# Patient Record
Sex: Male | Born: 1965 | Race: Black or African American | Hispanic: No | Marital: Married | Smoking: Current every day smoker
Health system: Southern US, Community
[De-identification: ages and names within clinical notes are randomized; demographics above are authoritative.]

## PROBLEM LIST (undated history)

## (undated) DIAGNOSIS — Z21 Asymptomatic human immunodeficiency virus [HIV] infection status: Secondary | ICD-10-CM

## (undated) DIAGNOSIS — E119 Type 2 diabetes mellitus without complications: Secondary | ICD-10-CM

## (undated) DIAGNOSIS — I10 Essential (primary) hypertension: Secondary | ICD-10-CM

## (undated) DIAGNOSIS — B20 Human immunodeficiency virus [HIV] disease: Secondary | ICD-10-CM

## (undated) HISTORY — DX: Human immunodeficiency virus (HIV) disease: B20

---

## 2013-02-17 DIAGNOSIS — I10 Essential (primary) hypertension: Secondary | ICD-10-CM | POA: Diagnosis present

## 2013-12-25 DIAGNOSIS — E1042 Type 1 diabetes mellitus with diabetic polyneuropathy: Secondary | ICD-10-CM | POA: Diagnosis present

## 2015-06-18 DIAGNOSIS — F1020 Alcohol dependence, uncomplicated: Secondary | ICD-10-CM | POA: Insufficient documentation

## 2017-05-12 ENCOUNTER — Other Ambulatory Visit: Payer: Self-pay

## 2017-05-12 ENCOUNTER — Encounter (HOSPITAL_BASED_OUTPATIENT_CLINIC_OR_DEPARTMENT_OTHER): Payer: Self-pay | Admitting: *Deleted

## 2017-05-12 ENCOUNTER — Emergency Department (HOSPITAL_BASED_OUTPATIENT_CLINIC_OR_DEPARTMENT_OTHER): Payer: BLUE CROSS/BLUE SHIELD

## 2017-05-12 ENCOUNTER — Emergency Department (HOSPITAL_BASED_OUTPATIENT_CLINIC_OR_DEPARTMENT_OTHER)
Admission: EM | Admit: 2017-05-12 | Discharge: 2017-05-12 | Disposition: A | Discharge status: Home / Self Care | Payer: BLUE CROSS/BLUE SHIELD | Attending: Physician Assistant | Admitting: Physician Assistant

## 2017-05-12 DIAGNOSIS — Y929 Unspecified place or not applicable: Secondary | ICD-10-CM | POA: Diagnosis not present

## 2017-05-12 DIAGNOSIS — T300 Burn of unspecified body region, unspecified degree: Secondary | ICD-10-CM

## 2017-05-12 DIAGNOSIS — Z794 Long term (current) use of insulin: Secondary | ICD-10-CM | POA: Diagnosis not present

## 2017-05-12 DIAGNOSIS — Y939 Activity, unspecified: Secondary | ICD-10-CM | POA: Insufficient documentation

## 2017-05-12 DIAGNOSIS — Z79899 Other long term (current) drug therapy: Secondary | ICD-10-CM | POA: Insufficient documentation

## 2017-05-12 DIAGNOSIS — F17210 Nicotine dependence, cigarettes, uncomplicated: Secondary | ICD-10-CM | POA: Insufficient documentation

## 2017-05-12 DIAGNOSIS — X04XXXA Exposure to ignition of highly flammable material, initial encounter: Secondary | ICD-10-CM | POA: Insufficient documentation

## 2017-05-12 DIAGNOSIS — T24231A Burn of second degree of right lower leg, initial encounter: Secondary | ICD-10-CM | POA: Insufficient documentation

## 2017-05-12 DIAGNOSIS — E119 Type 2 diabetes mellitus without complications: Secondary | ICD-10-CM | POA: Diagnosis not present

## 2017-05-12 DIAGNOSIS — T23292A Burn of second degree of multiple sites of left wrist and hand, initial encounter: Secondary | ICD-10-CM | POA: Insufficient documentation

## 2017-05-12 DIAGNOSIS — Y998 Other external cause status: Secondary | ICD-10-CM | POA: Diagnosis not present

## 2017-05-12 DIAGNOSIS — T2010XA Burn of first degree of head, face, and neck, unspecified site, initial encounter: Secondary | ICD-10-CM | POA: Insufficient documentation

## 2017-05-12 HISTORY — DX: Type 2 diabetes mellitus without complications: E11.9

## 2017-05-12 MED ORDER — IBUPROFEN 800 MG PO TABS
800.0000 mg | ORAL_TABLET | Freq: Once | ORAL | Status: AC
  Administered 2017-05-12: 800 mg via ORAL
  Filled 2017-05-12: qty 1

## 2017-05-12 MED ORDER — SILVER SULFADIAZINE 1 % EX CREA
TOPICAL_CREAM | Freq: Two times a day (BID) | CUTANEOUS | Status: DC
  Administered 2017-05-12: 1 via TOPICAL
  Filled 2017-05-12: qty 85

## 2017-05-12 NOTE — ED Notes (Signed)
Blister to right lateral ankle is noted and treated with Silvadene as ordered.

## 2017-05-12 NOTE — ED Notes (Signed)
ED Provider at bedside. 

## 2017-05-12 NOTE — Discharge Instructions (Signed)
Please use the cream that we gave you on your leg.  Please keep clean and dry and protected from infection.  You may use bacitracin for your face.  Please follow-up with your primary care physician.  Please return with any signs of infection.
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
## 2018-02-26 ENCOUNTER — Other Ambulatory Visit: Payer: Self-pay

## 2018-02-26 ENCOUNTER — Encounter (HOSPITAL_BASED_OUTPATIENT_CLINIC_OR_DEPARTMENT_OTHER): Payer: Self-pay | Admitting: *Deleted

## 2018-02-26 ENCOUNTER — Emergency Department (HOSPITAL_BASED_OUTPATIENT_CLINIC_OR_DEPARTMENT_OTHER): Payer: BLUE CROSS/BLUE SHIELD

## 2018-02-26 ENCOUNTER — Emergency Department (HOSPITAL_BASED_OUTPATIENT_CLINIC_OR_DEPARTMENT_OTHER)
Admission: EM | Admit: 2018-02-26 | Discharge: 2018-02-26 | Disposition: A | Discharge status: Home / Self Care | Payer: BLUE CROSS/BLUE SHIELD | Attending: Emergency Medicine | Admitting: Emergency Medicine

## 2018-02-26 DIAGNOSIS — R0789 Other chest pain: Secondary | ICD-10-CM | POA: Diagnosis not present

## 2018-02-26 DIAGNOSIS — F17210 Nicotine dependence, cigarettes, uncomplicated: Secondary | ICD-10-CM | POA: Insufficient documentation

## 2018-02-26 DIAGNOSIS — R079 Chest pain, unspecified: Secondary | ICD-10-CM | POA: Diagnosis present

## 2018-02-26 DIAGNOSIS — Z794 Long term (current) use of insulin: Secondary | ICD-10-CM | POA: Diagnosis not present

## 2018-02-26 DIAGNOSIS — E119 Type 2 diabetes mellitus without complications: Secondary | ICD-10-CM | POA: Diagnosis not present

## 2018-02-26 LAB — CBC WITH DIFFERENTIAL/PLATELET
Abs Immature Granulocytes: 0.01 10*3/uL (ref 0.00–0.07)
Basophils Absolute: 0 10*3/uL (ref 0.0–0.1)
Basophils Relative: 1 %
EOS ABS: 0.2 10*3/uL (ref 0.0–0.5)
Eosinophils Relative: 4 %
HCT: 38.2 % — ABNORMAL LOW (ref 39.0–52.0)
HEMOGLOBIN: 12.5 g/dL — AB (ref 13.0–17.0)
IMMATURE GRANULOCYTES: 0 %
LYMPHS ABS: 1.8 10*3/uL (ref 0.7–4.0)
LYMPHS PCT: 41 %
MCH: 31.9 pg (ref 26.0–34.0)
MCHC: 32.7 g/dL (ref 30.0–36.0)
MCV: 97.4 fL (ref 80.0–100.0)
MONOS PCT: 13 %
Monocytes Absolute: 0.6 10*3/uL (ref 0.1–1.0)
NEUTROS PCT: 41 %
Neutro Abs: 1.8 10*3/uL (ref 1.7–7.7)
PLATELETS: 228 10*3/uL (ref 150–400)
RBC: 3.92 MIL/uL — ABNORMAL LOW (ref 4.22–5.81)
RDW: 12.3 % (ref 11.5–15.5)
WBC: 4.3 10*3/uL (ref 4.0–10.5)
nRBC: 0 % (ref 0.0–0.2)

## 2018-02-26 LAB — COMPREHENSIVE METABOLIC PANEL
ALBUMIN: 3.6 g/dL (ref 3.5–5.0)
ALT: 13 U/L (ref 0–44)
ANION GAP: 10 (ref 5–15)
AST: 22 U/L (ref 15–41)
Alkaline Phosphatase: 76 U/L (ref 38–126)
BUN: 13 mg/dL (ref 6–20)
CO2: 24 mmol/L (ref 22–32)
Calcium: 9.3 mg/dL (ref 8.9–10.3)
Chloride: 104 mmol/L (ref 98–111)
Creatinine, Ser: 0.78 mg/dL (ref 0.61–1.24)
GFR calc Af Amer: >60 mL/min (ref >60)
GFR calc non Af Amer: >60 mL/min (ref >60)
GLUCOSE: 174 mg/dL — AB (ref 70–99)
Potassium: 3.5 mmol/L (ref 3.5–5.1)
SODIUM: 138 mmol/L (ref 135–145)
Total Bilirubin: 0.6 mg/dL (ref 0.3–1.2)
Total Protein: 7.1 g/dL (ref 6.5–8.1)

## 2018-02-26 LAB — TROPONIN I
Troponin I: <0.03 ng/mL (ref <0.03)
Troponin I: <0.03 ng/mL (ref <0.03)

## 2018-02-26 MED ORDER — ASPIRIN 81 MG PO CHEW
324.0000 mg | CHEWABLE_TABLET | Freq: Once | ORAL | Status: AC
  Administered 2018-02-26: 324 mg via ORAL
  Filled 2018-02-26: qty 4

## 2018-02-26 MED ORDER — LACTATED RINGERS IV BOLUS
1000.0000 mL | Freq: Once | INTRAVENOUS | Status: AC
  Administered 2018-02-26: 1000 mL via INTRAVENOUS

## 2018-02-26 NOTE — ED Provider Notes (Signed)
[facility] EMERGENCY DEPARTMENT Provider Note   CSN: [id number] Arrival date & time: 02/26/18  4098     History   Chief Complaint Chief Complaint  Patient presents with  . Numbness  . Chest Pain    HPI Carl Reed is a 52 y.o. male.   Chest Pain   This is a new problem. The current episode started 3 to 5 hours ago. The problem occurs constantly. The problem has been resolved. The pain is present in the substernal region and lateral region. The pain is moderate. The quality of the pain is described as brief and pressure-like. The pain radiates to the left jaw. Pertinent negatives include no cough. He has tried nothing for the symptoms. The treatment provided no relief.    Past Medical History:  Diagnosis Date  . Diabetes mellitus without complication (HCC)     There are no active problems to display for this patient.   History reviewed. No pertinent surgical history.      Home Medications    Prior to Admission medications   Medication Sig Start Date End Date Taking? Authorizing Provider  gabapentin (NEURONTIN) 300 MG capsule Take 300 mg by mouth 3 (three) times daily.   Yes [provider]  insulin aspart (NOVOLOG) 100 UNIT/ML injection Inject into the skin 3 (three) times daily before meals.   Yes [provider]  metFORMIN (GLUCOPHAGE) 500 MG tablet Take by mouth 2 (two) times daily with a meal.   Yes [provider]    Family History History reviewed. No pertinent family history.  Social History Social History   Tobacco Use  . Smoking status: Current Every Day Smoker    Packs/day: 1.00    Types: Cigarettes  . Smokeless tobacco: Never Used  Substance Use Topics  . Alcohol use: Yes    Alcohol/week: 4.0 standard drinks    Types: 2 Shots of liquor, 2 Cans of beer per week    Frequency: Never  . Drug use: No     Allergies   Patient has no known allergies.   Review of Systems Review of Systems  Respiratory:  Negative for cough.   Cardiovascular: Positive for chest pain.  All other systems reviewed and are negative.    Physical Exam Updated Vital Signs BP (!) 150/91 (BP Location: Left Arm)   Pulse 88   Temp 98.6 F (37 C) (Oral)   Resp 14   Ht 5\' 11"  (1.803 m)   Wt 65.8 kg   SpO2 100%   BMI 20.22 kg/m   Physical Exam  Constitutional: He is oriented to person, place, and time. He appears well-developed and well-nourished.  HENT:  Head: Normocephalic and atraumatic.  Neck: Normal range of motion.  Cardiovascular: Normal rate.  Pulmonary/Chest: Effort normal. No respiratory distress. He has no decreased breath sounds. He has no wheezes.  Abdominal: He exhibits no distension. There is no tenderness.  Musculoskeletal: Normal range of motion.       Right lower leg: Normal.       Left lower leg: Normal.  Neurological: He is alert and oriented to person, place, and time. He is not disoriented. No cranial nerve deficit.  Skin: Skin is warm.  Nursing note and vitals reviewed.    ED Treatments / Results  Labs (all labs ordered are listed, but only abnormal results are displayed) Labs Reviewed  CBC WITH DIFFERENTIAL/PLATELET - Abnormal; Notable for the following components:      Result Value   RBC 3.92 (*)  Hemoglobin 12.5 (*)    HCT 38.2 (*)    All other components within normal limits  COMPREHENSIVE METABOLIC PANEL - Abnormal; Notable for the following components:   Glucose, Bld 174 (*)    All other components within normal limits  TROPONIN I  TROPONIN I    EKG EKG Interpretation  Date/Time:  Tuesday February 26 2018 08:46:28 EST Ventricular Rate:  98 PR Interval:    QRS Duration: 67 QT Interval:  339 QTC Calculation: 433 R Axis:   64 Text Interpretation:  Sinus rhythm Probable anteroseptal infarct, old Minimal ST elevation, inferior leads No old tracing to compare Confirmed by Marily MemosMesner, Osby Sweetin [phone]([id number]) on 02/26/2018 9:19:48 AM   EKG  Interpretation  Date/Time:  Tuesday February 26 2018 12:08:37 EST Ventricular Rate:  96 PR Interval:    QRS Duration: 68 QT Interval:  332 QTC Calculation: 420 R Axis:   67 Text Interpretation:  Sinus rhythm Probable anteroseptal infarct, old Minimal ST elevation, inferior leads no change from earlier in day. likely benign early repolarization Confirmed by Marily MemosMesner, Temekia Caskey [phone]([id number]) on 02/26/2018 3:23:50 PM       Radiology Dg Chest 2 View  Result Date: 02/26/2018 CLINICAL DATA:  Chest pain, numbness EXAM: CHEST - 2 VIEW COMPARISON:  09/29/2013 FINDINGS: Heart and mediastinal contours are within normal limits. No focal opacities or effusions. No acute bony abnormality. IMPRESSION: No active cardiopulmonary disease. Electronically Signed   By: Charlett NoseKevin  Dover M.D.   On: 02/26/2018 09:37    Procedures Procedures (including critical care time)  Medications Ordered in ED Medications  lactated ringers bolus 1,000 mL (0 mLs Intravenous Stopped 02/26/18 1212)  aspirin chewable tablet 324 mg (324 mg Oral Given 02/26/18 0945)     Initial Impression / Assessment and Plan / ED Course  I have reviewed the triage vital signs and the nursing notes.  Pertinent labs & imaging results that were available during my care of the patient were reviewed by me and considered in my medical decision making (see chart for details).     Low suspicion for TIA or stroke at this point with just tingling in the left side of his face without any motor issues.  Also some chest pressure could be related to that no evidence of ischemia on EKG or serial troponins.  We will have him follow-up with cardiology.  Patient symptom-free at time of discharge.  Final Clinical Impressions(s) / ED Diagnoses   Final diagnoses:  Atypical chest pain    ED Discharge Orders         Ordered    Ambulatory referral to Cardiology     02/26/18 1258           Leevon Upperman, Barbara CowerJason, MD 02/26/18 1523

## 2018-02-26 NOTE — ED Triage Notes (Signed)
This morning at 4:00 pt noticed that R side of face was numb, his hands are always numb, he also noticed that he noticed tightness in the middle of his chest.

## 2018-02-26 NOTE — ED Notes (Signed)
ED Provider at bedside. 

## 2019-05-08 DIAGNOSIS — S62617D Displaced fracture of proximal phalanx of left little finger, subsequent encounter for fracture with routine healing: Secondary | ICD-10-CM | POA: Insufficient documentation

## 2020-02-11 ENCOUNTER — Ambulatory Visit: Payer: BLUE CROSS/BLUE SHIELD | Attending: "Endocrinology

## 2020-02-11 DIAGNOSIS — E1042 Type 1 diabetes mellitus with diabetic polyneuropathy: Secondary | ICD-10-CM

## 2020-02-11 DIAGNOSIS — I152 Hypertension secondary to endocrine disorders: Secondary | ICD-10-CM

## 2020-02-11 DIAGNOSIS — E1059 Type 1 diabetes mellitus with other circulatory complications: Secondary | ICD-10-CM

## 2020-02-11 NOTE — Progress Notes
Reason for Consultation: diabetes management    HISTORY OF PRESENT ILLNESS  Diabetes History:  Type of diabetes: type I  Duration of diabetes: diagnosed in 2007  Symptoms at the time of diagnosis: BG > 600-800, polyuria, losing weight  Prior hospitalizations for diabetes: at diagnosis  Family history of diabetes: Yes; brother, mother  Family history of autoimmunity:     Diabetes Complications and Surveillance:  Retinopathy: No   Last eye exam:    Nephropathy: No  Peripheral neuropathy: Yes   Sees podiatry: No   History of foot sores/infections: No   History of amputations: No  Autonomic neuropathy: No  Macrovascular disease:   CAD: No   PVD: No   Stroke: No   Hypertension: Yes   Dyslipidemia: No   Tobacco use: current.    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    Data was reviewed for 11/14/2019 through 02/11/2020  The average was 255 mg/dL   The coefficient variation of 40.3 %  Time in range 19%, below 70 mg/dL 2%, very low below 54 mg/dL 2%, above 914 mg/dL 78%, above 295 mg/dL 62%.    In examining the patterns and providing interpretation, I noted the following...   -generally running high   -some significant excursions   -frequent hypoglycemia events after boluses  Based on CGM interpetration, I recommend: see plan below    Hypoglycemia:    Frequency: couple times per week   Awareness: Fair   Severe Hypoglycemia: in past   Glucagon available: No   Medic alert ID: No    Problem List  -T1DM  -HTN  -HIV  -ED  -GERD  -Osteopenia  -Alcohol abuse  -Tobacco use  -Hx of orbital fracture  -Hx of vasectomy    Medications  Current Outpatient Medications   Medication Sig   ??? insulin glargine, 1 Unit Dial, (TOUJEO SOLOSTAR) 300 units/mL injection pen Inject 0.053 mLs (16 Units total) under the skin daily.   ??? meloxicam 15 mg tablet Take 17 mg by mouth daily.   ??? metFORMIN 500 mg tablet Take 500 mg by mouth two (2) times daily with meals.   ??? Continuous Blood Gluc Receiver (DEXCOM G6 RECEIVER) DEVI 1 each by Does not apply route continuous.   ??? Continuous Blood Gluc Sensor (DEXCOM G6 SENSOR) MISC 1 each by Does not apply route continuous.   ??? Continuous Blood Gluc Transmit (DEXCOM G6 TRANSMITTER) MISC 1 each by Does not apply route continuous.   ??? insulin lispro, 1 Unit Dial, (HUMALOG KWIKPEN) 100 units/mL injection pen Use as instructed with provided scale; up to 100 units per Mitchell Chung.     No current facility-administered medications for this visit.     PHYSICAL EXAM:  Vitals:    02/11/20  1628   BP: 101/67   Pulse: 98   Resp: 16   SpO2: 100%   Weight: 155 lb (70.3 kg)   Height: 5' 11.5'' (1.816 m)     Body mass index is 21.32 kg/m???.  Wt Readings from Last 3 Encounters:   02/11/20 155 lb (70.3 kg)     General appearance:   Well appearing male in no apparent distress.  HEENT:   EOMI, oropharynx clear, mucous membranes moist.  Neck:   No obvious thyromegaly  Heart:  RRR  Lungs:  Breathing comfortably on room air   Extremities: No lower extremity edema.  Skin:   No acanthosis nigricans. No lipohypertrophy at insulin injection sites.  Neuro:  AAO x 3.  No tremor.  Psych:  Mood and affect are appropriate.  Diabetic Foot Exam not performed    LABS:  No results found for: NA, K, CL, CO2, BUN, CREAT  No results found for: WBC, HGB, HCT, MCV, PLT  No results found for: ALBUMIN, BILITOT, ALT, AST, ALKPHOS  No results found for: CHOL, CHOLHDL, CHOLDLQ, CHOLDLCAL, TRIGLY  No results found for: HGBA1C  No results found for: ALBCREATUR  No results found for: TSH     11-10-2019  HbA1c 9.5%    03-05-2019  c-peptide <0.05  GAD Ab 0.22 (<0.02)    06-30-2015  GAD Ab 82 (<5)  Insulin Ab 14.6 (<0.4)    ASSESSMENT & PLAN:  Mitchell Chung is a 54 y.o. male who presents to clinic for further evaluation and management of type I diabetes mellitus.     1. Type 1 Diabetes Mellitus:  Mitchell Chung has had T1DM since 2007. Disease complications include at least peripheral neuropathy. The most recent HbA1c is not at goal. Obstacles to improved glycemic control include pain an chronic illness. He has too much variabilty. Asked him to focus on consistency with bolus doses. Appears that whatever scale he is using is currently too aggressive. We'll ask his facility to send [place] a copy of orders.  -Discussed natural history and physiology of diabetes mellitus  -Discused goals of therapy  -Goal HbA1c: < 7%  -Medication changes today:    -Glucose Monitoring: The patient uses a continuous glucose sensor to adjust insulin dose via 3+ insulin injections per Lynard Postlewait.  -Reviewed ADA protocols for management of hypoglycemia  -Labs:   -HbA1c every 3 months   -annual CMP, lipid panel, microalbumin-creatinine ratio   -annual TSH  -Referrals:   -Lifestyle: reviewed dietary modifications, exercise as tolerated  -routine vaccinations recommended    2. Microvascular Disease Screening  -Discussed risk of long-term microvascular complications in relation to glycemic control  There are no preventive care reminders to display for this patient.    3. Cardiovascular Risk Assessment:  Diagnoses: hypertension  Estimated 10-year ASCVD risk:   Review of recent lipid panel reveals:  Blood pressure today is within target (<140/90 vs <130/80).  -Treatment recommendations:   -blood pressure medications: consider restart ACE-I   -lipid medications: consider statin   -antiplatelet medications:   -Smoking cessation recommended    4. Weight Assessment  Body mass index is 21.32 kg/m???. This result is normal (ideal is less than 25 kg/m2).    Return to Clinic: 1 month if still in town    Osborne Oman Jibran Crookshanks, MD  [facility] pager [phone]    Medical Decision Making  Number and Complexity of Problems Addressed at this Encounter   []  Straightforward: self-limited  []  Low: stable, uncomplicated, single problem  [x]  Moderate:  multiple problems or significantly ill  []  High: very ill or high risk of complications and/or morbidity or mortality    Amount/Complexity of Data that was Reviewed and Analyzed   [x]  Category 1: Review of prior external notes. Review of unique test results. Order unique test. Requirement of an independent historian.   []  Category 2: Independent interpretation of tests.   []  Category 3: Discussion of management or test interpretation with external provider.    Risk of Complications and/or Morbidity/Mortality of Patient Management   []  Minimal Risk   []  Low Risk   [x]  Moderate Risk   []  High Risk therapy: {obes behav:16539}  -Diet interventions: {obes UEAV:40981}  -Informal exercise measures discussed and recommended 150 minutes of moderate-intensity exercise per week.  -Medication: {XBJ:47829}  -Referral for surgery consult (***)    Return to Clinic: ***      Osborne Oman Neftaly Inzunza, MD  [facility] pager [phone]

## 2020-02-12 ENCOUNTER — Telehealth: Payer: BLUE CROSS/BLUE SHIELD

## 2020-02-12 MED ORDER — DEXCOM G6 SENSOR MISC
1 | 11 refills | Status: AC
Start: 2020-02-12 — End: ?

## 2020-02-12 MED ORDER — INSULIN LISPRO (1 UNIT DIAL) 100 UNIT/ML SC SOPN
11 refills | 30.00000 days | Status: AC
Start: 2020-02-12 — End: ?

## 2020-02-12 MED ORDER — DEXCOM G6 RECEIVER DEVI
1 | 1 refills | Status: AC
Start: 2020-02-12 — End: ?

## 2020-02-12 MED ORDER — TOUJEO SOLOSTAR 300 UNIT/ML SC SOPN
16 [IU] | Freq: Every day | SUBCUTANEOUS | 3 refills | 32.00000 days | Status: AC
Start: 2020-02-12 — End: ?

## 2020-02-12 MED ORDER — DEXCOM G6 TRANSMITTER MISC
1 | 3 refills | Status: AC
Start: 2020-02-12 — End: ?

## 2020-02-12 NOTE — Telephone Encounter
Call Back Request      Reason for call back: Wynona Canes from [facility] calling, pt needs to confirm where the [facility] G6 receiver and sensor Transmitter will be sent to. Wynona Canes has also faxed a medication list along with insuline sliding skill and request for release of information. For continuation of care.      Any Symptoms:  []  Yes  [x]  No       If yes, what symptoms are you experiencing:    o Duration of symptoms (how long):    o Have you taken medication for symptoms (OTC or Rx):      Patient or caller has been notified of the 24-48 hour turnaround time.

## 2020-02-12 NOTE — Patient Instructions
-  continue same dose of Toujeo and metformin    -we'll work on obtaining the Humalog scale to make adjustments

## 2020-02-13 ENCOUNTER — Telehealth: Payer: BLUE CROSS/BLUE SHIELD

## 2020-02-13 NOTE — Telephone Encounter
Called [facility] provided patient phone number spoke to Langdon K.

## 2020-02-13 NOTE — Telephone Encounter
Spoke to [facility] and left a message for [employer] is the one who is going to be handling patient Dexcom      T; [phone]

## 2020-02-13 NOTE — Telephone Encounter
Call Back Request      Reason for call back: [facility] requesting phone number of pt to finalize rx order.    T; [phone]  Option 2   Any Symptoms:  []  Yes  [x]  No       If yes, what symptoms are you experiencing:    o Duration of symptoms (how long):    o Have you taken medication for symptoms (OTC or Rx):      Patient or caller has been notified of the 24-48 hour turnaround time.

## 2020-02-16 ENCOUNTER — Telehealth: Payer: BLUE CROSS/BLUE SHIELD

## 2020-02-16 NOTE — Telephone Encounter
Call Back Request      Reason for call back: Pt stated he was returning call to MD.    [phone]  Any Symptoms:  []  Yes  [x]  No       If yes, what symptoms are you experiencing:    o Duration of symptoms (how long):    o Have you taken medication for symptoms (OTC or Rx):      Patient or caller has been notified of the 24-48 hour turnaround time.

## 2020-02-17 ENCOUNTER — Telehealth: Payer: BLUE CROSS/BLUE SHIELD

## 2020-02-17 NOTE — Telephone Encounter
Call Back Request      Reason for call back: Pt would like sliding scale for blood sugar.     Any Symptoms:  []  Yes  []  No       If yes, what symptoms are you experiencing:    o Duration of symptoms (how long):    o Have you taken medication for symptoms (OTC or Rx):      Patient or caller has been notified of the 24-48 hour turnaround time.

## 2020-02-17 NOTE — Telephone Encounter
Call Back Request      Reason for call back: Pt advised he tried reaching out to [facility] but states they have no record of him. Requesting a call back for further assistance.     Any Symptoms:  []  Yes  [x]  No       If yes, what symptoms are you experiencing:    o Duration of symptoms (how long):    o Have you taken medication for symptoms (OTC or Rx):      Patient or caller has been notified of the 24-48 hour turnaround time.

## 2020-02-17 NOTE — Telephone Encounter
I called the patient on CBN provided and was unable to leave a message,voicemailbox is not set up.    There is no record of Dr.Day calling patient, perhaps [facility] pharmacy called patient to discuss rx. Please inform patient when/if patient calls back.

## 2020-02-18 LAB — COLOGUARD

## 2020-02-18 LAB — EXTERNAL GENERIC LAB PROCEDURE

## 2020-02-18 NOTE — Telephone Encounter
Spoke with Destiny from [employer] and was informed that patient just spoke with her regarding prescription. Since patient is at a living facility and will no longer be there as of Friday 02/20/2020, Rx will be sent to local [facility].         Destiny provided Case manager Fleet Contras CBN [phone] and Living Facility CBN [phone]

## 2020-03-27 ENCOUNTER — Emergency Department (HOSPITAL_BASED_OUTPATIENT_CLINIC_OR_DEPARTMENT_OTHER): Payer: BC Managed Care – PPO

## 2020-03-27 ENCOUNTER — Encounter (HOSPITAL_BASED_OUTPATIENT_CLINIC_OR_DEPARTMENT_OTHER): Payer: Self-pay | Admitting: *Deleted

## 2020-03-27 ENCOUNTER — Other Ambulatory Visit: Payer: Self-pay

## 2020-03-27 ENCOUNTER — Observation Stay (HOSPITAL_BASED_OUTPATIENT_CLINIC_OR_DEPARTMENT_OTHER)
Admission: EM | Admit: 2020-03-27 | Discharge: 2020-03-28 | Disposition: A | Discharge status: Home / Self Care | Payer: BC Managed Care – PPO | Attending: Internal Medicine | Admitting: Internal Medicine

## 2020-03-27 DIAGNOSIS — R000 Tachycardia, unspecified: Secondary | ICD-10-CM | POA: Diagnosis not present

## 2020-03-27 DIAGNOSIS — I10 Essential (primary) hypertension: Secondary | ICD-10-CM | POA: Diagnosis not present

## 2020-03-27 DIAGNOSIS — F17210 Nicotine dependence, cigarettes, uncomplicated: Secondary | ICD-10-CM | POA: Insufficient documentation

## 2020-03-27 DIAGNOSIS — R42 Dizziness and giddiness: Secondary | ICD-10-CM | POA: Insufficient documentation

## 2020-03-27 DIAGNOSIS — E119 Type 2 diabetes mellitus without complications: Secondary | ICD-10-CM

## 2020-03-27 DIAGNOSIS — E1010 Type 1 diabetes mellitus with ketoacidosis without coma: Principal | ICD-10-CM | POA: Insufficient documentation

## 2020-03-27 DIAGNOSIS — Z21 Asymptomatic human immunodeficiency virus [HIV] infection status: Secondary | ICD-10-CM

## 2020-03-27 DIAGNOSIS — K219 Gastro-esophageal reflux disease without esophagitis: Secondary | ICD-10-CM

## 2020-03-27 DIAGNOSIS — Z7984 Long term (current) use of oral hypoglycemic drugs: Secondary | ICD-10-CM | POA: Insufficient documentation

## 2020-03-27 DIAGNOSIS — B20 Human immunodeficiency virus [HIV] disease: Secondary | ICD-10-CM | POA: Insufficient documentation

## 2020-03-27 DIAGNOSIS — E1110 Type 2 diabetes mellitus with ketoacidosis without coma: Secondary | ICD-10-CM | POA: Diagnosis present

## 2020-03-27 DIAGNOSIS — Z794 Long term (current) use of insulin: Secondary | ICD-10-CM | POA: Insufficient documentation

## 2020-03-27 DIAGNOSIS — R197 Diarrhea, unspecified: Secondary | ICD-10-CM | POA: Insufficient documentation

## 2020-03-27 DIAGNOSIS — R7989 Other specified abnormal findings of blood chemistry: Secondary | ICD-10-CM

## 2020-03-27 DIAGNOSIS — R112 Nausea with vomiting, unspecified: Secondary | ICD-10-CM | POA: Diagnosis present

## 2020-03-27 DIAGNOSIS — Z20822 Contact with and (suspected) exposure to covid-19: Secondary | ICD-10-CM | POA: Insufficient documentation

## 2020-03-27 DIAGNOSIS — Z87898 Personal history of other specified conditions: Secondary | ICD-10-CM

## 2020-03-27 LAB — COMPREHENSIVE METABOLIC PANEL
ALT: 21 U/L (ref 0–44)
AST: 18 U/L (ref 15–41)
Albumin: 4.6 g/dL (ref 3.5–5.0)
Alkaline Phosphatase: 141 U/L — ABNORMAL HIGH (ref 38–126)
Anion gap: 26 — ABNORMAL HIGH (ref 5–15)
BUN: 45 mg/dL — ABNORMAL HIGH (ref 6–20)
CO2: 12 mmol/L — ABNORMAL LOW (ref 22–32)
Calcium: 9.8 mg/dL (ref 8.9–10.3)
Chloride: 88 mmol/L — ABNORMAL LOW (ref 98–111)
Creatinine, Ser: 1.87 mg/dL — ABNORMAL HIGH (ref 0.61–1.24)
GFR, Estimated: 43 mL/min — ABNORMAL LOW (ref >60)
Glucose, Bld: 675 mg/dL (ref 70–99)
Potassium: 5.6 mmol/L — ABNORMAL HIGH (ref 3.5–5.1)
Sodium: 126 mmol/L — ABNORMAL LOW (ref 135–145)
Total Bilirubin: 1.7 mg/dL — ABNORMAL HIGH (ref 0.3–1.2)
Total Protein: 8.3 g/dL — ABNORMAL HIGH (ref 6.5–8.1)

## 2020-03-27 LAB — BASIC METABOLIC PANEL
Anion gap: 14 (ref 5–15)
Anion gap: 11 (ref 5–15)
BUN: 35 mg/dL — ABNORMAL HIGH (ref 6–20)
BUN: 30 mg/dL — ABNORMAL HIGH (ref 6–20)
CO2: 16 mmol/L — ABNORMAL LOW (ref 22–32)
CO2: 21 mmol/L — ABNORMAL LOW (ref 22–32)
Calcium: 8.5 mg/dL — ABNORMAL LOW (ref 8.9–10.3)
Calcium: 9.4 mg/dL (ref 8.9–10.3)
Chloride: 101 mmol/L (ref 98–111)
Chloride: 105 mmol/L (ref 98–111)
Creatinine, Ser: 1.4 mg/dL — ABNORMAL HIGH (ref 0.61–1.24)
Creatinine, Ser: 1.42 mg/dL — ABNORMAL HIGH (ref 0.61–1.24)
GFR, Estimated: >60 mL/min (ref >60)
GFR, Estimated: 59 mL/min — ABNORMAL LOW (ref >60)
Glucose, Bld: 326 mg/dL — ABNORMAL HIGH (ref 70–99)
Glucose, Bld: 217 mg/dL — ABNORMAL HIGH (ref 70–99)
Potassium: 4.9 mmol/L (ref 3.5–5.1)
Potassium: 5 mmol/L (ref 3.5–5.1)
Sodium: 131 mmol/L — ABNORMAL LOW (ref 135–145)
Sodium: 137 mmol/L (ref 135–145)

## 2020-03-27 LAB — CBC WITH DIFFERENTIAL/PLATELET
Abs Immature Granulocytes: 0.02 10*3/uL (ref 0.00–0.07)
Basophils Absolute: 0 10*3/uL (ref 0.0–0.1)
Basophils Relative: 0 %
Eosinophils Absolute: 0 10*3/uL (ref 0.0–0.5)
Eosinophils Relative: 0 %
HCT: 42.9 % (ref 39.0–52.0)
Hemoglobin: 14.2 g/dL (ref 13.0–17.0)
Immature Granulocytes: 0 %
Lymphocytes Relative: 14 %
Lymphs Abs: 1.3 10*3/uL (ref 0.7–4.0)
MCH: 32.3 pg (ref 26.0–34.0)
MCHC: 33.1 g/dL (ref 30.0–36.0)
MCV: 97.7 fL (ref 80.0–100.0)
Monocytes Absolute: 0.6 10*3/uL (ref 0.1–1.0)
Monocytes Relative: 6 %
Neutro Abs: 7.3 10*3/uL (ref 1.7–7.7)
Neutrophils Relative %: 80 %
Platelets: 305 10*3/uL (ref 150–400)
RBC: 4.39 MIL/uL (ref 4.22–5.81)
RDW: 11.2 % — ABNORMAL LOW (ref 11.5–15.5)
WBC: 9.3 10*3/uL (ref 4.0–10.5)
nRBC: 0 % (ref 0.0–0.2)

## 2020-03-27 LAB — I-STAT VENOUS BLOOD GAS, ED
Acid-base deficit: 12 mmol/L — ABNORMAL HIGH (ref 0.0–2.0)
Bicarbonate: 14.5 mmol/L — ABNORMAL LOW (ref 20.0–28.0)
Calcium, Ion: 1.27 mmol/L (ref 1.15–1.40)
HCT: 47 % (ref 39.0–52.0)
Hemoglobin: 16 g/dL (ref 13.0–17.0)
O2 Saturation: 36 %
Potassium: 5.4 mmol/L — ABNORMAL HIGH (ref 3.5–5.1)
Sodium: 125 mmol/L — ABNORMAL LOW (ref 135–145)
TCO2: 16 mmol/L — ABNORMAL LOW (ref 22–32)
pCO2, Ven: 36.1 mmHg — ABNORMAL LOW (ref 44.0–60.0)
pH, Ven: 7.212 — ABNORMAL LOW (ref 7.250–7.430)
pO2, Ven: 26 mmHg — CL (ref 32.0–45.0)

## 2020-03-27 LAB — LIPASE, BLOOD: Lipase: 19 U/L (ref 11–51)

## 2020-03-27 LAB — URINALYSIS, MICROSCOPIC (REFLEX)
RBC / HPF: NONE SEEN RBC/hpf (ref 0–5)
WBC, UA: NONE SEEN WBC/hpf (ref 0–5)

## 2020-03-27 LAB — GLUCOSE, CAPILLARY
Glucose-Capillary: 232 mg/dL — ABNORMAL HIGH (ref 70–99)
Glucose-Capillary: 184 mg/dL — ABNORMAL HIGH (ref 70–99)
Glucose-Capillary: 156 mg/dL — ABNORMAL HIGH (ref 70–99)
Glucose-Capillary: 163 mg/dL — ABNORMAL HIGH (ref 70–99)
Glucose-Capillary: 210 mg/dL — ABNORMAL HIGH (ref 70–99)
Glucose-Capillary: 243 mg/dL — ABNORMAL HIGH (ref 70–99)
Glucose-Capillary: 282 mg/dL — ABNORMAL HIGH (ref 70–99)

## 2020-03-27 LAB — CBG MONITORING, ED
Glucose-Capillary: >600 mg/dL (ref 70–99)
Glucose-Capillary: 451 mg/dL — ABNORMAL HIGH (ref 70–99)
Glucose-Capillary: 376 mg/dL — ABNORMAL HIGH (ref 70–99)
Glucose-Capillary: 271 mg/dL — ABNORMAL HIGH (ref 70–99)
Glucose-Capillary: 284 mg/dL — ABNORMAL HIGH (ref 70–99)

## 2020-03-27 LAB — URINALYSIS, ROUTINE W REFLEX MICROSCOPIC
Bilirubin Urine: NEGATIVE
Glucose, UA: >=500 mg/dL — AB
Hgb urine dipstick: NEGATIVE
Ketones, ur: 80 mg/dL — AB
Leukocytes,Ua: NEGATIVE
Nitrite: NEGATIVE
Protein, ur: NEGATIVE mg/dL
Specific Gravity, Urine: 1.015 (ref 1.005–1.030)
pH: 5 (ref 5.0–8.0)

## 2020-03-27 LAB — RESP PANEL BY RT-PCR (FLU A&B, COVID) ARPGX2
Influenza A by PCR: NEGATIVE
Influenza B by PCR: NEGATIVE
SARS Coronavirus 2 by RT PCR: NEGATIVE

## 2020-03-27 LAB — BETA-HYDROXYBUTYRIC ACID
Beta-Hydroxybutyric Acid: >8 mmol/L — ABNORMAL HIGH (ref 0.05–0.27)
Beta-Hydroxybutyric Acid: 2.27 mmol/L — ABNORMAL HIGH (ref 0.05–0.27)

## 2020-03-27 MED ORDER — LACTATED RINGERS IV SOLN: INTRAVENOUS | Status: DC

## 2020-03-27 MED ORDER — DEXTROSE 50 % IV SOLN: 0.0000 mL | INTRAVENOUS | Status: DC | PRN

## 2020-03-27 MED ORDER — INSULIN REGULAR(HUMAN) IN NACL 100-0.9 UT/100ML-% IV SOLN: INTRAVENOUS | Status: DC

## 2020-03-27 MED ORDER — FAMOTIDINE IN NACL 20-0.9 MG/50ML-% IV SOLN
20.0000 mg | Freq: Once | INTRAVENOUS | Status: AC
  Administered 2020-03-27: 20 mg via INTRAVENOUS
  Filled 2020-03-27: qty 50

## 2020-03-27 MED ORDER — ONDANSETRON HCL 4 MG/2ML IJ SOLN
4.0000 mg | Freq: Once | INTRAMUSCULAR | Status: AC
Start: 2020-03-27 — End: 2020-03-27
  Administered 2020-03-27: 4 mg via INTRAVENOUS
  Filled 2020-03-27: qty 2

## 2020-03-27 MED ORDER — SODIUM CHLORIDE 0.9 % IV BOLUS
1000.0000 mL | Freq: Once | INTRAVENOUS | Status: AC
  Administered 2020-03-27: 1000 mL via INTRAVENOUS

## 2020-03-27 MED ORDER — SODIUM CHLORIDE 0.9 % IV SOLN: INTRAVENOUS | Status: DC

## 2020-03-27 MED ORDER — ABACAVIR-DOLUTEGRAVIR-LAMIVUD 600-50-300 MG PO TABS
1.0000 | ORAL_TABLET | Freq: Every day | ORAL | Status: DC
  Administered 2020-03-28: 1 via ORAL
  Filled 2020-03-27: qty 1

## 2020-03-27 MED ORDER — LACTATED RINGERS IV BOLUS
20.0000 mL/kg | Freq: Once | INTRAVENOUS | Status: AC
  Administered 2020-03-27: 1000 mL via INTRAVENOUS

## 2020-03-27 MED ORDER — INSULIN GLARGINE 100 UNIT/ML ~~LOC~~ SOLN
12.0000 [IU] | SUBCUTANEOUS | Status: DC
  Administered 2020-03-27: 12 [IU] via SUBCUTANEOUS
  Filled 2020-03-27 (×2): qty 0.12

## 2020-03-27 MED ORDER — INSULIN REGULAR(HUMAN) IN NACL 100-0.9 UT/100ML-% IV SOLN
INTRAVENOUS | Status: AC
  Administered 2020-03-27: 3.8 [IU]/h via INTRAVENOUS
  Filled 2020-03-27: qty 100

## 2020-03-27 MED ORDER — DEXTROSE IN LACTATED RINGERS 5 % IV SOLN: INTRAVENOUS | Status: DC

## 2020-03-27 MED ORDER — ENOXAPARIN SODIUM 40 MG/0.4ML ~~LOC~~ SOLN
40.0000 mg | SUBCUTANEOUS | Status: DC
  Administered 2020-03-27: 40 mg via SUBCUTANEOUS
  Filled 2020-03-27: qty 0.4

## 2020-03-27 MED ORDER — PANTOPRAZOLE SODIUM 40 MG PO TBEC
40.0000 mg | DELAYED_RELEASE_TABLET | Freq: Every day | ORAL | Status: DC
  Administered 2020-03-28: 40 mg via ORAL
  Filled 2020-03-27: qty 1

## 2020-03-27 MED ORDER — INSULIN ASPART 100 UNIT/ML ~~LOC~~ SOLN
0.0000 [IU] | Freq: Three times a day (TID) | SUBCUTANEOUS | Status: DC
  Administered 2020-03-27: 8 [IU] via SUBCUTANEOUS
  Administered 2020-03-28: 15 [IU] via SUBCUTANEOUS

## 2020-03-27 NOTE — Progress Notes (Signed)
GAP closed x2 and patient asking for food. - Start CM diet - Administer Lantus 12 units - Continue insulin drip for 1-2 more hours, then discontinue and start SSI-S  - DC fluids if eating, drinking, and off insulin drip

## 2020-03-27 NOTE — ED Notes (Signed)
Since 03/03/20, weight has dropped from 145 lbs to 130.0 lbs - current weight.

## 2020-03-27 NOTE — ED Notes (Signed)
EDP updated pt's wife of the plan.

## 2020-03-27 NOTE — ED Notes (Signed)
ED Provider at bedside. 

## 2020-03-27 NOTE — ED Triage Notes (Signed)
Poor appetite since Thursday morning.  Nausea and diarrhea.

## 2020-03-27 NOTE — ED Notes (Signed)
Glucose 675, results given to ED MD and TaTa

## 2020-03-27 NOTE — ED Notes (Signed)
Called Pal # to have him transferred at 10:44

## 2020-03-27 NOTE — Progress Notes (Signed)
Patient with h/o DM and HIV (VL undetectable) presenting to [facility] with nausea and weakness.  N/V for a few days, blood sugars increasing.  Mild tachycardia and HTN in ER.  CO2 12, glucose 675, mild AKI, VBG with HCO3 14.5, pH 7.21.2.  RUQ [place] negative, AAS unremarkable.  Started on fluids, insulin.  By labs, this appears to be mild DKA.  He may be able to be corrected at [facility] and not require transfer.  Will place order for progressive care bed, but I have also reviewed DKA order set and transition order starting with CO2 of at least 20.  Will accept patient in observation if he gets a bed sooner than his DKA corrects.  Georgana Curio, M.D.

## 2020-03-27 NOTE — ED Provider Notes (Signed)
[facility] EMERGENCY DEPARTMENT Provider Note   CSN: [id number] Arrival date & time: 03/27/20  [id number]     History Chief Complaint  Patient presents with  . Nausea  . Weakness    Carl Reed is a 54 y.o. male.  HPI   Patient presents to the emergency room for evaluation of vomiting and diarrhea.  Patient states he started having the symptoms Thursday and they became more severe on Friday.  They have persisted since that time.  Patient has had numerous episodes of vomiting and diarrhea.  Is not able to keep anything down.  He is not having any abdominal pain but he has noticed some acid reflux type discomfort in his chest associated with the vomiting.  He denies any fevers or chills.  He has not been on any antibiotics recently.  Patient states he has a history of HIV but has been compliant with his medications.  His viral load is undetectable.  Patient also has diabetes and his blood sugar has been labile over the last couple of days.  He has been taking his medications  Past Medical History:  Diagnosis Date  . Diabetes mellitus without complication (HCC)   HIV disease  There are no problems to display for this patient.   History reviewed. No pertinent surgical history.     History reviewed. No pertinent family history.  Social History   Tobacco Use  . Smoking status: Current Every Day Smoker    Packs/day: 1.00    Types: Cigarettes  . Smokeless tobacco: Never Used  Vaping Use  . Vaping Use: Every day  Substance Use Topics  . Alcohol use: Yes    Alcohol/week: 4.0 standard drinks    Types: 2 Cans of beer, 2 Shots of liquor per week  . Drug use: No    Home Medications Prior to Admission medications   Medication Sig Start Date End Date Taking? Authorizing Provider  gabapentin (NEURONTIN) 300 MG capsule Take 300 mg by mouth 3 (three) times daily.    [provider]  insulin aspart (NOVOLOG) 100 UNIT/ML injection Inject into the skin 3 (three)  times daily before meals.    [provider]  metFORMIN (GLUCOPHAGE) 500 MG tablet Take by mouth 2 (two) times daily with a meal.    [provider]    Allergies    Patient has no known allergies.  Review of Systems   Review of Systems  All other systems reviewed and are negative.   Physical Exam Updated Vital Signs BP (!) 141/68 (BP Location: Left Arm)   Pulse (!) 102   Temp (!) 97.5 F (36.4 C) (Oral)   Resp 20   Ht 1.803 m (5\' 11" )   Wt 59 kg   SpO2 100%   BMI 18.13 kg/m   Physical Exam Vitals and nursing note reviewed.  Constitutional:      Appearance: He is well-developed and well-nourished. He is ill-appearing.     Comments: Underweight  HENT:     Head: Normocephalic and atraumatic.     Right Ear: External ear normal.     Left Ear: External ear normal.     Mouth/Throat:     Pharynx: No oropharyngeal exudate or posterior oropharyngeal erythema.     Comments: No erythema or lesions Eyes:     General: No scleral icterus.       Right eye: No discharge.        Left eye: No discharge.     Conjunctiva/sclera: Conjunctivae  normal.  Neck:     Trachea: No tracheal deviation.  Cardiovascular:     Rate and Rhythm: Normal rate and regular rhythm.     Pulses: Intact distal pulses.  Pulmonary:     Effort: Pulmonary effort is normal. No respiratory distress.     Breath sounds: Normal breath sounds. No stridor. No wheezing or rales.  Abdominal:     General: Bowel sounds are normal. There is no distension.     Palpations: Abdomen is soft.     Tenderness: There is no abdominal tenderness. There is no guarding or rebound.  Musculoskeletal:        General: No tenderness or edema.     Cervical back: Neck supple.  Skin:    General: Skin is warm and dry.     Findings: No rash.  Neurological:     Mental Status: He is alert.     Cranial Nerves: No cranial nerve deficit (no facial droop, extraocular movements intact, no slurred speech).     Sensory: No  sensory deficit.     Motor: No abnormal muscle tone or seizure activity.     Coordination: Coordination normal.     Deep Tendon Reflexes: Strength normal.  Psychiatric:        Mood and Affect: Mood and affect normal.     ED Results / Procedures / Treatments   Labs (all labs ordered are listed, but only abnormal results are displayed) Labs Reviewed  COMPREHENSIVE METABOLIC PANEL - Abnormal; Notable for the following components:      Result Value   Sodium 126 (*)    Potassium 5.6 (*)    Chloride 88 (*)    CO2 12 (*)    Glucose, Bld 675 (*)    BUN 45 (*)    Creatinine, Ser 1.87 (*)    Total Protein 8.3 (*)    Alkaline Phosphatase 141 (*)    Total Bilirubin 1.7 (*)    GFR, Estimated 43 (*)    Anion gap 26 (*)    All other components within normal limits  CBC WITH DIFFERENTIAL/PLATELET - Abnormal; Notable for the following components:   RDW 11.2 (*)    All other components within normal limits  URINALYSIS, ROUTINE W REFLEX MICROSCOPIC - Abnormal; Notable for the following components:   Glucose, UA >=500 (*)    Ketones, ur 80 (*)    All other components within normal limits  URINALYSIS, MICROSCOPIC (REFLEX) - Abnormal; Notable for the following components:   Bacteria, UA RARE (*)    All other components within normal limits  I-STAT VENOUS BLOOD GAS, ED - Abnormal; Notable for the following components:   pH, Ven 7.212 (*)    pCO2, Ven 36.1 (*)    pO2, Ven 26.0 (*)    Bicarbonate 14.5 (*)    TCO2 16 (*)    Acid-base deficit 12.0 (*)    Sodium 125 (*)    Potassium 5.4 (*)    All other components within normal limits  RESP PANEL BY RT-PCR (FLU A&B, COVID) ARPGX2  LIPASE, BLOOD  BASIC METABOLIC PANEL  BASIC METABOLIC PANEL  BASIC METABOLIC PANEL  BASIC METABOLIC PANEL  BETA-HYDROXYBUTYRIC ACID  BETA-HYDROXYBUTYRIC ACID  CBG MONITORING, ED    EKG EKG Interpretation  Date/Time:  Saturday March 27 2020 09:43:03 EST Ventricular Rate:  100 PR Interval:    QRS  Duration: 65 QT Interval:  338 QTC Calculation: 436 R Axis:   73 Text Interpretation: Sinus tachycardia Right atrial enlargement Anteroseptal  infarct, old Since last tracing rate faster Confirmed by Linwood Dibbles [phone]) on 03/27/2020 10:02:03 AM   Radiology DG Abd Acute W/Chest  Result Date: 03/27/2020 CLINICAL DATA:  Nausea, vomiting EXAM: DG ABDOMEN ACUTE WITH 1 VIEW CHEST COMPARISON:  09/10/2011, 06/18/2018 FINDINGS: There is no evidence of dilated bowel loops or free intraperitoneal air. No radiopaque calculi or other significant radiographic abnormality is seen. Heart size and mediastinal contours are within normal limits. Both lungs are clear. Mixed lucent and sclerotic changes of the bilateral femoral heads compatible with avascular necrosis. There is subtle superior surface contour irregularity of the left femoral head suspicious for early articular collapse. IMPRESSION: 1. Nonobstructive bowel gas pattern. 2. No acute cardiopulmonary findings. 3. 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 Electronically Signed   By: Duanne Guess D.O.   On: 03/27/2020 09:31    Procedures .Critical Care Performed by: Linwood Dibbles, MD Authorized by: Linwood Dibbles, MD   Critical care provider statement:    Critical care time (minutes):  45   Critical care was time spent personally by me on the following activities:  Discussions with consultants, evaluation of patient's response to treatment, examination of patient, ordering and performing treatments and interventions, ordering and review of laboratory studies, ordering and review of radiographic studies, pulse oximetry, re-evaluation of patient's condition, obtaining history from patient or surrogate and review of old charts   (including critical care time)  Medications Ordered in ED Medications  sodium chloride 0.9 % bolus 1,000 mL (0 mLs Intravenous Stopped 03/27/20 1048)    And   sodium chloride 0.9 % bolus 1,000 mL (1,000 mLs Intravenous New Bag/Given 03/27/20 1048)    And  0.9 %  sodium chloride infusion (has no administration in time range)  insulin regular, human (MYXREDLIN) 100 units/ 100 mL infusion (3.8 Units/hr Intravenous New Bag/Given 03/27/20 1022)  lactated ringers infusion (has no administration in time range)  dextrose 5 % in lactated ringers infusion (has no administration in time range)  dextrose 50 % solution 0-50 mL (has no administration in time range)  ondansetron (ZOFRAN) injection 4 mg (4 mg Intravenous Given 03/27/20 0926)  famotidine (PEPCID) IVPB 20 mg premix (20 mg Intravenous New Bag/Given 03/27/20 0927)  lactated ringers bolus 1,180 mL (1,000 mLs Intravenous New Bag/Given 03/27/20 1027)    ED Course  I have reviewed the triage vital signs and the nursing notes.  Pertinent labs & imaging results that were available during my care of the patient were reviewed by me and considered in my medical decision making (see chart for details).  Clinical Course as of 03/27/20 1517  Sat Mar 27, 2020  8299 Labs reviewed.  Patient has decreased pH, low bicarb and hyperglycemia.  Consistent with diabetic ketoacidosis. [JK]  0957 Urinalysis without signs of infection but does show ketonuria [JK]  0957 CBC normal. [JK]  1053 Discussed findings with the patient.  Feeling somewhat better with treatment [JK]  1109 PALS line contacted to arrange for possible transfer [JK]  1131 No beds available at [facility].  WIll need to try [facility] facility. [JK]  1157 Discussed with Dr Ophelia Charter.  Will admit.  No beds available.  Will continue to treat in the ED.  If bicarb is greater than 20 can switch to transition orders if pt is still holding in the ED [JK]    Clinical Course User Index [JK] Linwood Dibbles, MD   MDM Rules/Calculators/A&P  Patient presented to the ED for evaluation of nausea vomiting and weakness.  Symptoms initially suggestive of the  possibility of viral gastroenteritis although the patient appeared dehydrated and history of diabetes.  DKA was also a concern.  Laboratory tests are consistent with diabetic ketoacidosis.  Patient has hyperglycemia and an elevated anion gap metabolic acidosis.  Abdominal exam is otherwise benign.  Lipase is normal.  Slight increase in his LFTs but doubt cholecystitis hepatitis at this time.  Will ultrasound the abdomen to evaluate further.  Patient has been started on IV fluids and an insulin infusion.  We will continue to monitor electrolytes and blood sugar closely.  Patient will require hospitalization for further treatment.  He is followed at the [facility].  He requests transfer to 1 of those facilities.  I will see if we can arrange transfer.  Case was discussed with Dr. Ophelia Charter.  Patient has been accepted for transfer.  We will need to continue to manage while the patient is awaiting a bed Final Clinical Impression(s) / ED Diagnoses Final diagnoses:  Diabetic ketoacidosis without coma associated with type 1 diabetes mellitus (HCC)      Linwood Dibbles, MD 03/27/20 [phone]

## 2020-03-27 NOTE — ED Notes (Signed)
EDP made aware of NS completion and stated to recheck BMET in 2 hours.

## 2020-03-27 NOTE — H&P (Signed)
History and Physical   Carl Reed HKF:[id number] DOB: Dec 19, 1965 DOA: 03/27/2020  PCP: Patient, No Pcp Per   Patient coming from: Home via [facility]  Chief Complaint: Abdominal pain  HPI: Carl Reed is a 54 y.o. male with medical history significant of diabetes, HIV, hypertension, neuropathy, GERD who presents with vomiting and diarrhea.  Patient has been having symptoms since Thursday and symptoms are become more severe on Friday and have continued into today.  He has had multiple episodes of vomiting and diarrhea and is unable to keep down anything.Marland Kitchen  He had an some epigastric discomfort associated with vomiting which felt like GERD pain.  Denies any blood in his vomit. He denies fever, chills, chest pain, shortness of breath.  He is seen for his medical conditions at [facility] will check his information in [place].  ED Course: Vital signs significant for mild tachycardia in the 90s to 100s in the ED.  With blood pressure ranging from the 1 teens to 957M systolic.  Lab work-up significant for BMP with sodium of 126 which corrects considering glucose of 675, potassium 5.6, chloride 88, bicarb 12 with gap of 26, BUN 45, creatinine 1.87 from baseline of 0.8.  LFTs showed protein of 8.3, alk phos of 141, T bili 4.7.  CBC within normal limits.  Respiratory panel for flu and Covid negative.  Urinalysis showed glucose and ketones.  Beta hydroxybutyric acid is pending.  VBG showed acidemia at 7.21 and a PCO2 of 36.  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  Review of Systems: As per HPI otherwise all other systems reviewed and are negative.  Past Medical History:  Diagnosis Date  . Diabetes mellitus without complication ([place])     History reviewed. No pertinent surgical history.  Social History  reports that he has been smoking cigarettes. He has been smoking about 1.00 pack per day. He has never used  smokeless tobacco. He reports current alcohol use of about 4.0 standard drinks of alcohol per week. He reports that he does not use drugs.  No Known Allergies  History reviewed. No pertinent family history. Reviewed on admission  Prior to Admission medications   Medication Sig Start Date End Date Taking? Authorizing Provider  gabapentin (NEURONTIN) 300 MG capsule Take 300 mg by mouth 3 (three) times daily.    [provider]  insulin aspart (NOVOLOG) 100 UNIT/ML injection Inject into the skin 3 (three) times daily before meals.    [provider]  metFORMIN (GLUCOPHAGE) 500 MG tablet Take by mouth 2 (two) times daily with a meal.    [provider]  Lisinopril 5 mg daily Triumeq daily Gabapentin twice daily Disulfiram Omeprazole Trazodone Tadalafil  Physical Exam: Vitals:   03/27/20 1400 03/27/20 1500 03/27/20 1530 03/27/20 1645  BP: (!) 147/80 (!) 161/91 (!) 161/97 114/84  Pulse: (!) 102 99 96 (!) 112  Resp: _0 Temp:    98.3 F (36.8 C)  TempSrc:   Oral Oral  SpO2: 100% 100% 100% 100%  Weight:      Height:       Physical Exam Constitutional:      General: He is not in acute distress.    Appearance: Normal appearance.  HENT:     Head: Normocephalic and atraumatic.     Mouth/Throat:     Mouth: Mucous membranes are moist.     Pharynx: Oropharynx is clear.  Eyes:  Extraocular Movements: Extraocular movements intact.     Pupils: Pupils are equal, round, and reactive to light.  Cardiovascular:     Rate and Rhythm: Regular rhythm. Tachycardia present.     Pulses: Normal pulses.     Heart sounds: Normal heart sounds.  Pulmonary:     Effort: Pulmonary effort is normal. No respiratory distress.     Breath sounds: Normal breath sounds.  Abdominal:     General: Bowel sounds are normal. There is no distension.     Palpations: Abdomen is soft.     Tenderness: There is no abdominal tenderness.  Musculoskeletal:        General: No  swelling or deformity.  Skin:    General: Skin is warm and dry.  Neurological:     General: No focal deficit present.     Mental Status: Mental status is at baseline.    Labs on Admission: I have personally reviewed following labs and imaging studies  CBC: Recent Labs  Lab 03/27/20 0858 03/27/20 0917  WBC 9.3  --   NEUTROABS 7.3  --   HGB 14.2 16.0  HCT 42.9 47.0  MCV 97.7  --   PLT 305  --     Basic Metabolic Panel: Recent Labs  Lab 03/27/20 0858 03/27/20 0917 03/27/20 1426 03/27/20 1642  NA 126* 125* 131* 137  K 5.6* 5.4* 4.9 5.0  CL 88*  --  101 105  CO2 12*  --  16* 21*  GLUCOSE 675*  --  326* 217*  BUN 45*  --  35* 30*  CREATININE 1.87*  --  1.40* 1.42*  CALCIUM 9.8  --  8.5* 9.4    GFR: Estimated Creatinine Clearance: 50.8 mL/min (A) (by C-G formula based on SCr of 1.42 mg/dL (H)).  Liver Function Tests: Recent Labs  Lab 03/27/20 0858  AST 18  ALT 21  ALKPHOS 141*  BILITOT 1.7*  PROT 8.3*  ALBUMIN 4.6    Urine analysis:    Component Value Date/Time   COLORURINE YELLOW 03/27/2020 [place] 03/27/2020 0858   LABSPEC 1.015 03/27/2020 0858   PHURINE 5.0 03/27/2020 0858   GLUCOSEU >=500 (A) 03/27/2020 0858   HGBUR NEGATIVE 03/27/2020 0858   BILIRUBINUR NEGATIVE 03/27/2020 0858   KETONESUR 80 (A) 03/27/2020 0858   PROTEINUR NEGATIVE 03/27/2020 0858   NITRITE NEGATIVE 03/27/2020 0858   LEUKOCYTESUR NEGATIVE 03/27/2020 0858    Radiological Exams on Admission: DG Abd Acute W/Chest  Result Date: 03/27/2020 CLINICAL DATA:  Nausea, vomiting EXAM: DG ABDOMEN ACUTE WITH 1 VIEW CHEST COMPARISON:  09/10/2011, 06/18/2018 FINDINGS: There is no evidence of dilated bowel loops or free intraperitoneal air. No radiopaque calculi or other significant radiographic abnormality is seen. Heart size and mediastinal contours are within normal limits. Both lungs are clear. Mixed lucent and sclerotic changes of the bilateral femoral heads compatible  with avascular necrosis. There is subtle superior surface contour irregularity of the left femoral head suspicious for early articular collapse. IMPRESSION: 1. Nonobstructive bowel gas pattern. 2. No acute cardiopulmonary findings. 3. 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 Electronically Signed   By: Davina Poke D.O.   On: 03/27/2020 09:31   US Abdomen Limited RUQ (LIVER/GB)  Result Date: 03/27/2020 CLINICAL DATA:  Elevated LFTs. EXAM: ULTRASOUND ABDOMEN LIMITED RIGHT UPPER QUADRANT COMPARISON:  None. FINDINGS: Gallbladder: No gallstones or wall thickening visualized. No sonographic Murphy sign noted by sonographer. 3 mm nonmobile echogenic  focus without shadowing over the anterior gallbladder wall. This has slight comet tail artifact and subtle adenomyomatosis versus tiny polyp. Common bile duct: Diameter: 2.7 mm. Liver: Focal fatty change in the region of the inter lobar fissure. No focal mass. Portal vein is patent on color Doppler imaging with normal direction of blood flow towards the liver. Other: None. IMPRESSION: No acute hepatobiliary disease. Electronically Signed   By: Elberta Fortis M.D.   On: 03/27/2020 11:47    EKG: Independently reviewed.  EKG showed sinus tachycardia 100 bpm possible old anterior septal infarct considering Q waves in V2.  No other Q waves in a contiguous.  Assessment/Plan Principal Problem:   DKA (diabetic ketoacidosis) (HCC) Active Problems:   Diabetes (HCC)   HIV (human immunodeficiency virus infection) (HCC)   GERD (gastroesophageal reflux disease)   Hx of dizziness  Diabetes, T1 DKA > Presented with several days of nausea and vomiting which has been worsening > Glucose 625, bicarb 12 with gap of 26.  pH 7.21, urinalysis with glucose and ketones.  Beta hydroxybutyric acid pending. - Admit to progressive - Continue insulin drip - Potassium supplementation not indicated given  initial elevation - LR at 125 mL/hr until CBG less than 250 - Switch to LR-1/2 NS when CBG less than 250 - Nothing by mouth  - BMET every 4 hours - CBG Q1H - Once anion gap closed 2, start CM diet and if able to eat, administer Lantus 15 units - Continue insulin drip for 1-2 more hours, then discontinue and start SSI-S  - DC fluids if eating, drinking, and off insulin drip  HIV > Undetectable viral load - Continue home Triumeq  Hypertension - Holding lisinopril blood pressure is okay at the time he was seen and he has AKI  GERD > Worse symptoms with his recent vomiting - Continue PPI  Neuropathy - Holding gabapentin for now  History of dizziness - Being worked up outpatient   DVT prophylaxis: Lovenox  Code Status:   Full  Family Communication:  None on admission  Disposition Plan:   Patient is from:  Home  Anticipated DC to:  Home  Anticipated DC date:  1 to 2 days  Anticipated DC barriers: None  Consults called:  None Admission status:  Observation, progressive   Severity of Illness: The appropriate patient status for this patient is OBSERVATION. Observation status is judged to be reasonable and necessary in order to provide the required intensity of service to ensure the patient's safety. The patient's presenting symptoms, physical exam findings, and initial radiographic and laboratory data in the context of their medical condition is felt to place them at decreased risk for further clinical deterioration. Furthermore, it is anticipated that the patient will be medically stable for discharge from the hospital within 2 midnights of admission. The following factors support the patient status of observation.   " The patient's presenting symptoms include nausea, diarrhea. " The physical exam findings include tachycardia. " The initial radiographic and laboratory data are consistent with DKA.   Synetta Fail MD [facility]  How to contact the [facility] Attending  or Consulting provider 7A - 7P or covering provider during after hours 7P -7A, for this patient?   1. Check the care team in [facility] and look for a) attending/consulting TR[facility] provider listed and b) the [facility] team listed 2. Log into [email or web address] and use [place]'s universal password to access. If you do not have the password, please contact the hospital operator. 3. Locate  the [facility] provider you are looking for under [facility] and page to a number that you can be directly reached. 4. If you still have difficulty reaching the provider, please page the [facility] (Director on Call) for the Hospitalists listed on amion for assistance.  03/27/2020, 6:33 PM

## 2020-03-28 DIAGNOSIS — E1010 Type 1 diabetes mellitus with ketoacidosis without coma: Secondary | ICD-10-CM | POA: Diagnosis not present

## 2020-03-28 LAB — GLUCOSE, CAPILLARY
Glucose-Capillary: 189 mg/dL — ABNORMAL HIGH (ref 70–99)
Glucose-Capillary: 198 mg/dL — ABNORMAL HIGH (ref 70–99)
Glucose-Capillary: 342 mg/dL — ABNORMAL HIGH (ref 70–99)
Glucose-Capillary: 453 mg/dL — ABNORMAL HIGH (ref 70–99)
Glucose-Capillary: 453 mg/dL — ABNORMAL HIGH (ref 70–99)
Glucose-Capillary: 57 mg/dL — ABNORMAL LOW (ref 70–99)
Glucose-Capillary: 90 mg/dL (ref 70–99)

## 2020-03-28 LAB — BASIC METABOLIC PANEL
Anion gap: 10 (ref 5–15)
BUN: 19 mg/dL (ref 6–20)
CO2: 24 mmol/L (ref 22–32)
Calcium: 9.2 mg/dL (ref 8.9–10.3)
Chloride: 107 mmol/L (ref 98–111)
Creatinine, Ser: 1.12 mg/dL (ref 0.61–1.24)
GFR, Estimated: >60 mL/min (ref >60)
Glucose, Bld: 75 mg/dL (ref 70–99)
Potassium: 3.7 mmol/L (ref 3.5–5.1)
Sodium: 141 mmol/L (ref 135–145)

## 2020-03-28 MED ORDER — INSULIN GLARGINE (2 UNIT DIAL) 300 UNIT/ML ~~LOC~~ SOPN: 16.0000 [IU] | PEN_INJECTOR | Freq: Every day | SUBCUTANEOUS | 4 refills | Status: DC

## 2020-03-28 MED ORDER — INSULIN LISPRO 100 UNIT/ML ~~LOC~~ SOLN
SUBCUTANEOUS | 3 refills | Status: AC
Start: 2020-03-28 — End: 2023-04-26

## 2020-03-28 MED ORDER — ABACAVIR-DOLUTEGRAVIR-LAMIVUD 600-50-300 MG PO TABS: 1.0000 | ORAL_TABLET | Freq: Every day | ORAL | Status: DC

## 2020-03-28 NOTE — Discharge Summary (Signed)
Physician Discharge Summary  Carl Reed ACZ:[id number] DOB: 1965-06-20 DOA: 03/27/2020  PCP: Patient, No Pcp Per  Admit date: 03/27/2020 Discharge date: 03/28/2020  Admitted From: Home Disposition: Home Recommendations for Outpatient Follow-up:  1. Follow up with PCP in 1-2 weeks 2. Please obtain BMP/CBC in one week  Home Health none Equipment/Devices none  Discharge Condition stable CODE STATUS: Full code Diet recommendation: Cardiac Brief/Interim Summary: Carl Reed is a 54 y.o. male with medical history significant of diabetes, HIV, hypertension, neuropathy, GERD who presents with vomiting and diarrhea.  Patient has been having symptoms since Thursday and symptoms are become more severe on Friday and have continued into today.  He has had multiple episodes of vomiting and diarrhea and is unable to keep down anything.Marland Kitchen  He had an some epigastric discomfort associated with vomiting which felt like GERD pain.  Denies any blood in his vomit. He denies fever, chills, chest pain, shortness of breath.  He is seen for his medical conditions at [facility] will check his information in [place].  ED Course: Vital signs significant for mild tachycardia in the 90s to 100s in the ED.  With blood pressure ranging from the 1 teens to 109N systolic.  Lab work-up significant for BMP with sodium of 126 which corrects considering glucose of 675, potassium 5.6, chloride 88, bicarb 12 with gap of 26, BUN 45, creatinine 1.87 from baseline of 0.8.  LFTs showed protein of 8.3, alk phos of 141, T bili 4.7.  CBC within normal limits.  Respiratory panel for flu and Covid negative.  Urinalysis showed glucose and ketones.  Beta hydroxybutyric acid is pending.  VBG showed acidemia at 7.21 and a PCO2 of 36.  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   Discharge Diagnoses:  Principal Problem:   DKA (diabetic ketoacidosis)  ([place]) Active Problems:   Diabetes ([place])   HIV (human immunodeficiency virus infection) ([place])   GERD (gastroesophageal reflux disease)   Hx of dizziness   #1 DKA patient with type 1 diabetes admitted with nausea and vomiting.  He had a gap of 26 with a pH of 7.21 with urine glucose and ketones.  Admitting glucose was 625.  He was treated with IV fluids insulin drip and once his gap closed he was taken off the drip and was started on Lantus.  He was able to tolerate breakfast and lunch prior to discharge.  He did not have any further nausea vomiting.  He is followed by [facility] endocrinology.  I reviewed his chart from chart review and from talking to the patient he was on Toujeo 16 units nightly with Humalog insulin depending on the scale.  This will be continued upon discharge.  Follow-up with his primary endocrinologist.  #2 history of HIV continue home Triumeq  #3 history of essential hypertension on lisinopril  #4 history of GERD on PPI  #5 history of neuropathy on gabapentin  Estimated body mass index is 19.06 kg/m as calculated from the following:   Height as of this encounter: 5' 11"  (1.803 m).   Weight as of this encounter: 62 kg.  Discharge Instructions  Discharge Instructions    Diet - low sodium heart healthy   Complete by: As directed    Increase activity slowly   Complete by: As directed      Allergies as of 03/28/2020   No Known Allergies     Medication List    TAKE  these medications   abacavir-dolutegravir-lamiVUDine 600-50-300 MG tablet Commonly known as: TRIUMEQ Take 1 tablet by mouth daily. Start taking on: March 29, 2020   insulin glargine (2 Unit Dial) 300 UNIT/ML Solostar Pen Commonly known as: TOUJEO MAX Inject 16 Units into the skin daily.   insulin lispro 100 UNIT/ML injection Commonly known as: HUMALOG Continue as previously prescribed by [facility] endocrinologist depending on the scale       No Known  Allergies  Consultations: None  Procedures/Studies: DG Abd Acute W/Chest  Result Date: 03/27/2020 CLINICAL DATA:  Nausea, vomiting EXAM: DG ABDOMEN ACUTE WITH 1 VIEW CHEST COMPARISON:  09/10/2011, 06/18/2018 FINDINGS: There is no evidence of dilated bowel loops or free intraperitoneal air. No radiopaque calculi or other significant radiographic abnormality is seen. Heart size and mediastinal contours are within normal limits. Both lungs are clear. Mixed lucent and sclerotic changes of the bilateral femoral heads compatible with avascular necrosis. There is subtle superior surface contour irregularity of the left femoral head suspicious for early articular collapse. IMPRESSION: 1. Nonobstructive bowel gas pattern. 2. No acute cardiopulmonary findings. 3. 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 Electronically Signed   By: Davina Poke D.O.   On: 03/27/2020 09:31   US Abdomen Limited RUQ (LIVER/GB)  Result Date: 03/27/2020 CLINICAL DATA:  Elevated LFTs. EXAM: ULTRASOUND ABDOMEN LIMITED RIGHT UPPER QUADRANT COMPARISON:  None. FINDINGS: Gallbladder: No gallstones or wall thickening visualized. No sonographic Murphy sign noted by sonographer. 3 mm nonmobile echogenic focus without shadowing over the anterior gallbladder wall. This has slight comet tail artifact and subtle adenomyomatosis versus tiny polyp. Common bile duct: Diameter: 2.7 mm. Liver: Focal fatty change in the region of the inter lobar fissure. No focal mass. Portal vein is patent on color Doppler imaging with normal direction of blood flow towards the liver. Other: None. IMPRESSION: No acute hepatobiliary disease. Electronically Signed   By: Marin Olp M.D.   On: 03/27/2020 11:47    (Echo, Carotid, EGD, Colonoscopy, ERCP)    Subjective: Patient awake alert anxious to eat and anxious to go home no nausea vomiting abdominal pain or diarrhea  reported  Discharge Exam: Vitals:   03/28/20 0547 03/28/20 0827  BP: 121/82 (!) 141/73  Pulse:  94  Resp: 16 16  Temp: 97.9 F (36.6 C) 97.7 F (36.5 C)  SpO2: 99% 99%   Vitals:   03/27/20 1844 03/27/20 2043 03/28/20 0547 03/28/20 0827  BP: (!) 156/94 91/61 121/82 (!) 141/73  Pulse: 98   94  Resp: 18 18 16 16   Temp: 97.9 F (36.6 C) 98.6 F (37 C) 97.9 F (36.6 C) 97.7 F (36.5 C)  TempSrc: Oral Oral Oral Oral  SpO2: 99% 100% 99% 99%  Weight:   62 kg   Height:        General: Pt is alert, awake, not in acute distress Cardiovascular: RRR, S1/S2 +, no rubs, no gallops Respiratory: CTA bilaterally, no wheezing, no rhonchi Abdominal: Soft, NT, ND, bowel sounds + Extremities: no edema, no cyanosis    The results of significant diagnostics from this hospitalization (including imaging, microbiology, ancillary and laboratory) are listed below for reference.     Microbiology: Recent Results (from the past 240 hour(s))  Resp Panel by RT-PCR (Flu A&B, Covid) Nasopharyngeal Swab     Status: None   Collection Time: 03/27/20 10:42 AM   Specimen: Nasopharyngeal Swab; Nasopharyngeal(NP) swabs in vial transport medium  Result Value Ref Range  Status   SARS Coronavirus 2 by RT PCR NEGATIVE NEGATIVE Final    Comment: (NOTE) SARS-CoV-2 target nucleic acids are NOT DETECTED.  The SARS-CoV-2 RNA is generally detectable in upper respiratory specimens during the acute phase of infection. The lowest concentration of SARS-CoV-2 viral copies this assay can detect is 138 copies/mL. A negative result does not preclude SARS-Cov-2 infection and should not be used as the sole basis for treatment or other patient management decisions. A negative result may occur with  improper specimen collection/handling, submission of specimen other than nasopharyngeal swab, presence of viral mutation(s) within the areas targeted by this assay, and inadequate number of viral copies(<138 copies/mL). A  negative result must be combined with clinical observations, patient history, and epidemiological information. The expected result is Negative.  Fact Sheet for Patients:  EntrepreneurPulse.com.au  Fact Sheet for Healthcare Providers:  IncredibleEmployment.be  This test is no t yet approved or cleared by the [place] FDA and  has been authorized for detection and/or diagnosis of SARS-CoV-2 by FDA under an Emergency Use Authorization (EUA). This EUA will remain  in effect (meaning this test can be used) for the duration of the COVID-19 declaration under Section 564(b)(1) of the Act, 21 U.S.C.section 360bbb-3(b)(1), unless the authorization is terminated  or revoked sooner.       Influenza A by PCR NEGATIVE NEGATIVE Final   Influenza B by PCR NEGATIVE NEGATIVE Final    Comment: (NOTE) The Xpert Xpress SARS-CoV-2/FLU/RSV plus assay is intended as an aid in the diagnosis of influenza from Nasopharyngeal swab specimens and should not be used as a sole basis for treatment. Nasal washings and aspirates are unacceptable for Xpert Xpress SARS-CoV-2/FLU/RSV testing.  Fact Sheet for Patients: EntrepreneurPulse.com.au  Fact Sheet for Healthcare Providers: IncredibleEmployment.be  This test is not yet approved or cleared by the [place] FDA and has been authorized for detection and/or diagnosis of SARS-CoV-2 by FDA under an Emergency Use Authorization (EUA). This EUA will remain in effect (meaning this test can be used) for the duration of the COVID-19 declaration under Section 564(b)(1) of the Act, 21 U.S.C. section 360bbb-3(b)(1), unless the authorization is terminated or revoked.  Performed at [facility], [address] [place], [place] [address]      Labs: BNP (last 3 results) No results for input(s): BNP in the last 8760 hours. Basic Metabolic Panel: Recent Labs  Lab  03/27/20 0858 03/27/20 0917 03/27/20 1426 03/27/20 1642 03/28/20 0435  NA 126* 125* 131* 137 141  K 5.6* 5.4* 4.9 5.0 3.7  CL 88*  --  101 105 107  CO2 12*  --  16* 21* 24  GLUCOSE 675*  --  326* 217* 75  BUN 45*  --  35* 30* 19  CREATININE 1.87*  --  1.40* 1.42* 1.12  CALCIUM 9.8  --  8.5* 9.4 9.2   Liver Function Tests: Recent Labs  Lab 03/27/20 0858  AST 18  ALT 21  ALKPHOS 141*  BILITOT 1.7*  PROT 8.3*  ALBUMIN 4.6   Recent Labs  Lab 03/27/20 0858  LIPASE 19   No results for input(s): AMMONIA in the last 168 hours. CBC: Recent Labs  Lab 03/27/20 0858 03/27/20 0917  WBC 9.3  --   NEUTROABS 7.3  --   HGB 14.2 16.0  HCT 42.9 47.0  MCV 97.7  --   PLT 305  --    Cardiac Enzymes: No results for input(s): CKTOTAL, CKMB, CKMBINDEX, TROPONINI in the last  168 hours. BNP: Invalid input(s): POCBNP CBG: Recent Labs  Lab 03/27/20 2311 03/28/20 0022 03/28/20 0441 03/28/20 0743 03/28/20 0854  GLUCAP 282* 198* 90 57* 189*   D-Dimer No results for input(s): DDIMER in the last 72 hours. Hgb A1c No results for input(s): HGBA1C in the last 72 hours. Lipid Profile No results for input(s): CHOL, HDL, LDLCALC, TRIG, CHOLHDL, LDLDIRECT in the last 72 hours. Thyroid function studies No results for input(s): TSH, T4TOTAL, T3FREE, THYROIDAB in the last 72 hours.  Invalid input(s): FREET3 Anemia work up No results for input(s): VITAMINB12, FOLATE, FERRITIN, TIBC, IRON, RETICCTPCT in the last 72 hours. Urinalysis    Component Value Date/Time   COLORURINE YELLOW 03/27/2020 [place] 03/27/2020 0858   LABSPEC 1.015 03/27/2020 0858   PHURINE 5.0 03/27/2020 0858   GLUCOSEU >=500 (A) 03/27/2020 0858   HGBUR NEGATIVE 03/27/2020 0858   BILIRUBINUR NEGATIVE 03/27/2020 0858   KETONESUR 80 (A) 03/27/2020 0858   PROTEINUR NEGATIVE 03/27/2020 0858   NITRITE NEGATIVE 03/27/2020 0858   LEUKOCYTESUR NEGATIVE 03/27/2020 0858   Sepsis Labs Invalid input(s):  PROCALCITONIN,  WBC,  LACTICIDVEN Microbiology Recent Results (from the past 240 hour(s))  Resp Panel by RT-PCR (Flu A&B, Covid) Nasopharyngeal Swab     Status: None   Collection Time: 03/27/20 10:42 AM   Specimen: Nasopharyngeal Swab; Nasopharyngeal(NP) swabs in vial transport medium  Result Value Ref Range Status   SARS Coronavirus 2 by RT PCR NEGATIVE NEGATIVE Final    Comment: (NOTE) SARS-CoV-2 target nucleic acids are NOT DETECTED.  The SARS-CoV-2 RNA is generally detectable in upper respiratory specimens during the acute phase of infection. The lowest concentration of SARS-CoV-2 viral copies this assay can detect is 138 copies/mL. A negative result does not preclude SARS-Cov-2 infection and should not be used as the sole basis for treatment or other patient management decisions. A negative result may occur with  improper specimen collection/handling, submission of specimen other than nasopharyngeal swab, presence of viral mutation(s) within the areas targeted by this assay, and inadequate number of viral copies(<138 copies/mL). A negative result must be combined with clinical observations, patient history, and epidemiological information. The expected result is Negative.  Fact Sheet for Patients:  EntrepreneurPulse.com.au  Fact Sheet for Healthcare Providers:  IncredibleEmployment.be  This test is no t yet approved or cleared by the [place] FDA and  has been authorized for detection and/or diagnosis of SARS-CoV-2 by FDA under an Emergency Use Authorization (EUA). This EUA will remain  in effect (meaning this test can be used) for the duration of the COVID-19 declaration under Section 564(b)(1) of the Act, 21 U.S.C.section 360bbb-3(b)(1), unless the authorization is terminated  or revoked sooner.       Influenza A by PCR NEGATIVE NEGATIVE Final   Influenza B by PCR NEGATIVE NEGATIVE Final    Comment: (NOTE) The Xpert Xpress  SARS-CoV-2/FLU/RSV plus assay is intended as an aid in the diagnosis of influenza from Nasopharyngeal swab specimens and should not be used as a sole basis for treatment. Nasal washings and aspirates are unacceptable for Xpert Xpress SARS-CoV-2/FLU/RSV testing.  Fact Sheet for Patients: EntrepreneurPulse.com.au  Fact Sheet for Healthcare Providers: IncredibleEmployment.be  This test is not yet approved or cleared by the [place] FDA and has been authorized for detection and/or diagnosis of SARS-CoV-2 by FDA under an Emergency Use Authorization (EUA). This EUA will remain in effect (meaning this test can be used) for the duration of the COVID-19 declaration under Section 564(b)(1) of  the Act, 21 U.S.C. section 360bbb-3(b)(1), unless the authorization is terminated or revoked.  Performed at [facility], [address], [place], [place] [address]      Time coordinating discharge:  3 MIN SIGNED:   Georgette Shell, MD  [facility] 03/28/2020, 12:26 PM

## 2020-03-28 NOTE — Progress Notes (Signed)
Pt's CBG 453, MD Jerolyn Center notified

## 2020-03-28 NOTE — Progress Notes (Signed)
Hypoglycemic Event  CBG: 57  Treatment: 8 oz juice/soda and food intake   Symptoms: None  Follow-up CBG: Time: 0900 CBG Result:189  Possible Reasons for Event: Inadequate meal intake  Comments/MD notified: MD, Quentin Mulling

## 2020-03-28 NOTE — Progress Notes (Signed)
Inpatient Diabetes Program Recommendations  AACE/ADA: New Consensus Statement on Inpatient Glycemic Control (2015)  Target Ranges:  Prepandial:   less than 140 mg/dL      Peak postprandial:   less than 180 mg/dL (1-2 hours)      Critically ill patients:  140 - 180 mg/dL   Lab Results  Component Value Date   GLUCAP 189 (H) 03/28/2020    Review of Glycemic Control Results for Carl Reed, Carl Reed (MRN [id number]) as of 03/28/2020 11:22  Ref. Range 03/28/2020 07:43 03/28/2020 08:54  Glucose-Capillary Latest Ref Range: 70 - 99 mg/dL 57 (L) 497 (H)   Diabetes history: Type 1 DM Outpatient Diabetes medications:  Toujeo 16 units, Novolog with meals, Metformin 500 mg bid Current orders for Inpatient glycemic control:  Lantus 12 units q 24 hours, Novolog moderate tid with meals Inpatient Diabetes Program Recommendations:    Spoke to patient at length regarding DM.  Patient uses [employer] sensor.  States that he was vommitting and that he did not take his insulin.  We briefly discussed sick day rules and how even when he is not eating, he still needs at least his basal insulin.  We also discussed monitoring.  Patient does not have libre hooked to phone but state he intends too.  Encouraged f/u with PCP.  Last dose of Lantus was at 2100 last night so explained that He will need next dose of Toujeo tonight.  Patient verbalized understanding.  Reviewed hypoglycemia as well.    Thanks  Beryl Meager, RN, BC-ADM Inpatient Diabetes Coordinator Pager [phone] (8a-5p)

## 2020-03-28 NOTE — Plan of Care (Signed)

## 2020-03-28 NOTE — Plan of Care (Signed)
  Problem: Clinical Measurements: Goal: Ability to maintain clinical measurements within normal limits will improve Outcome: Progressing Goal: Diagnostic test results will improve Outcome: Progressing   Problem: Nutrition: Goal: Adequate nutrition will be maintained Outcome: Progressing   

## 2021-04-11 IMAGING — DX DG ABDOMEN ACUTE W/ 1V CHEST
3 series · 3 of 3 positions shown · non-contrast
Comparison: 09/10/2011, 06/18/2018

CLINICAL DATA: Nausea, vomiting

EXAM:
DG ABDOMEN ACUTE WITH 1 VIEW CHEST

[chest pa]
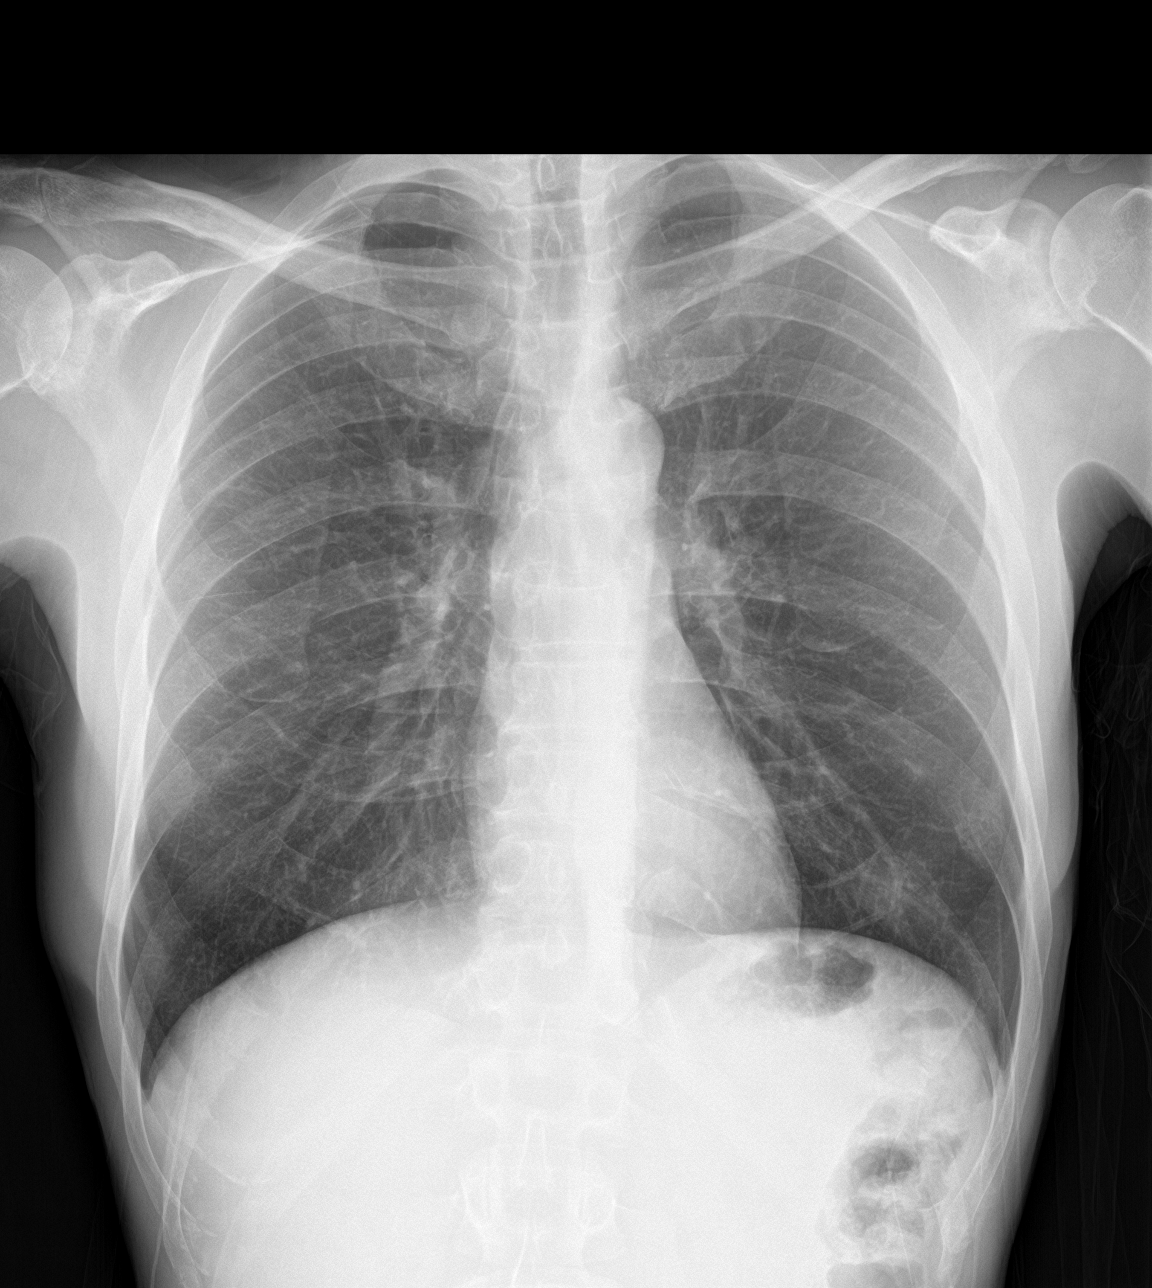

[abdomen erect]
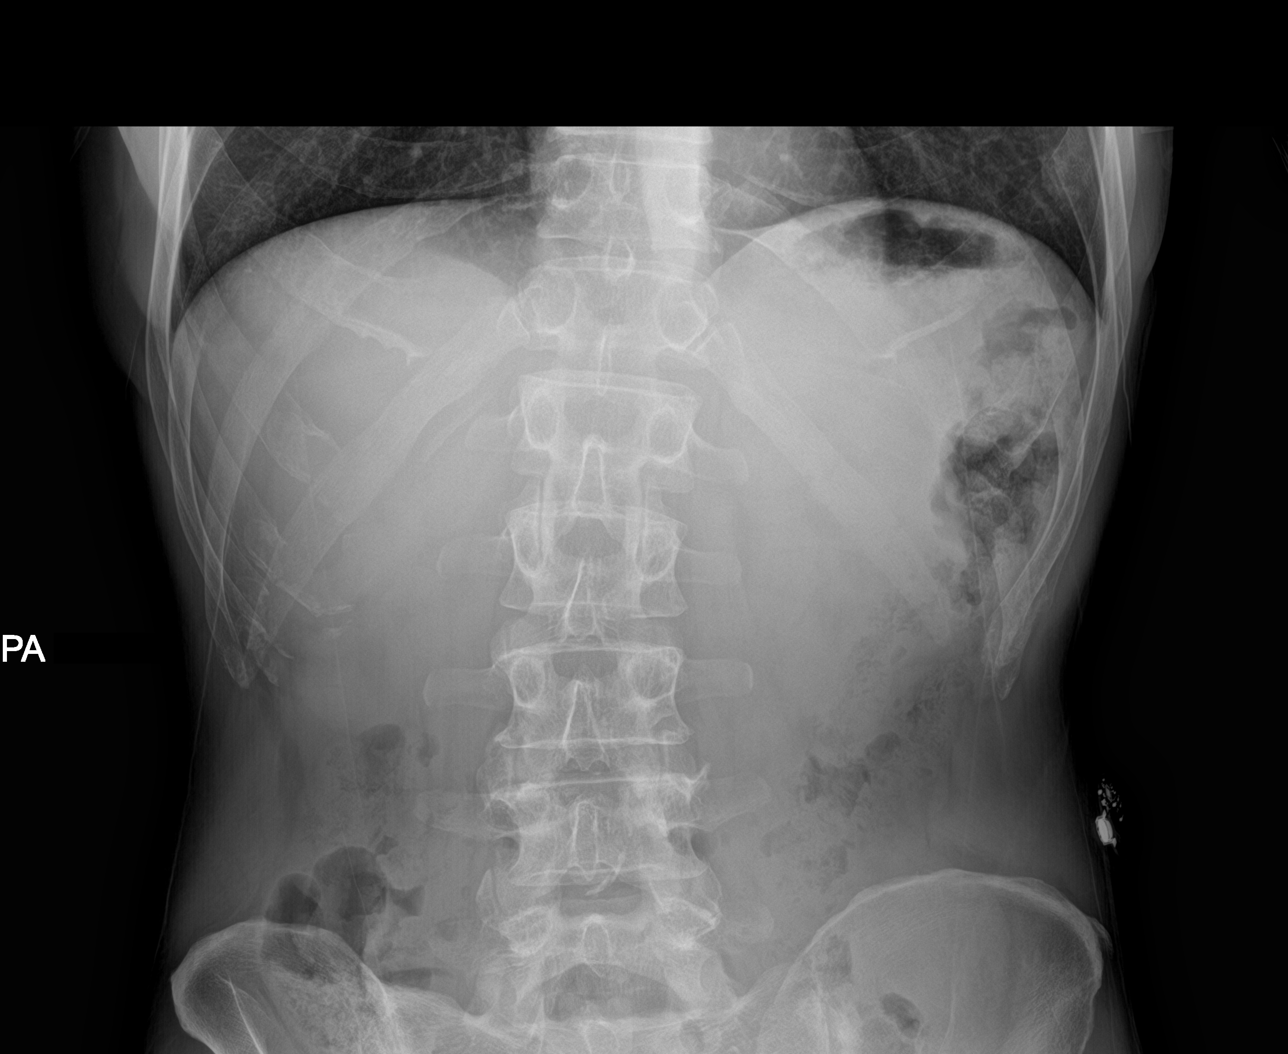

[abdomen supine]
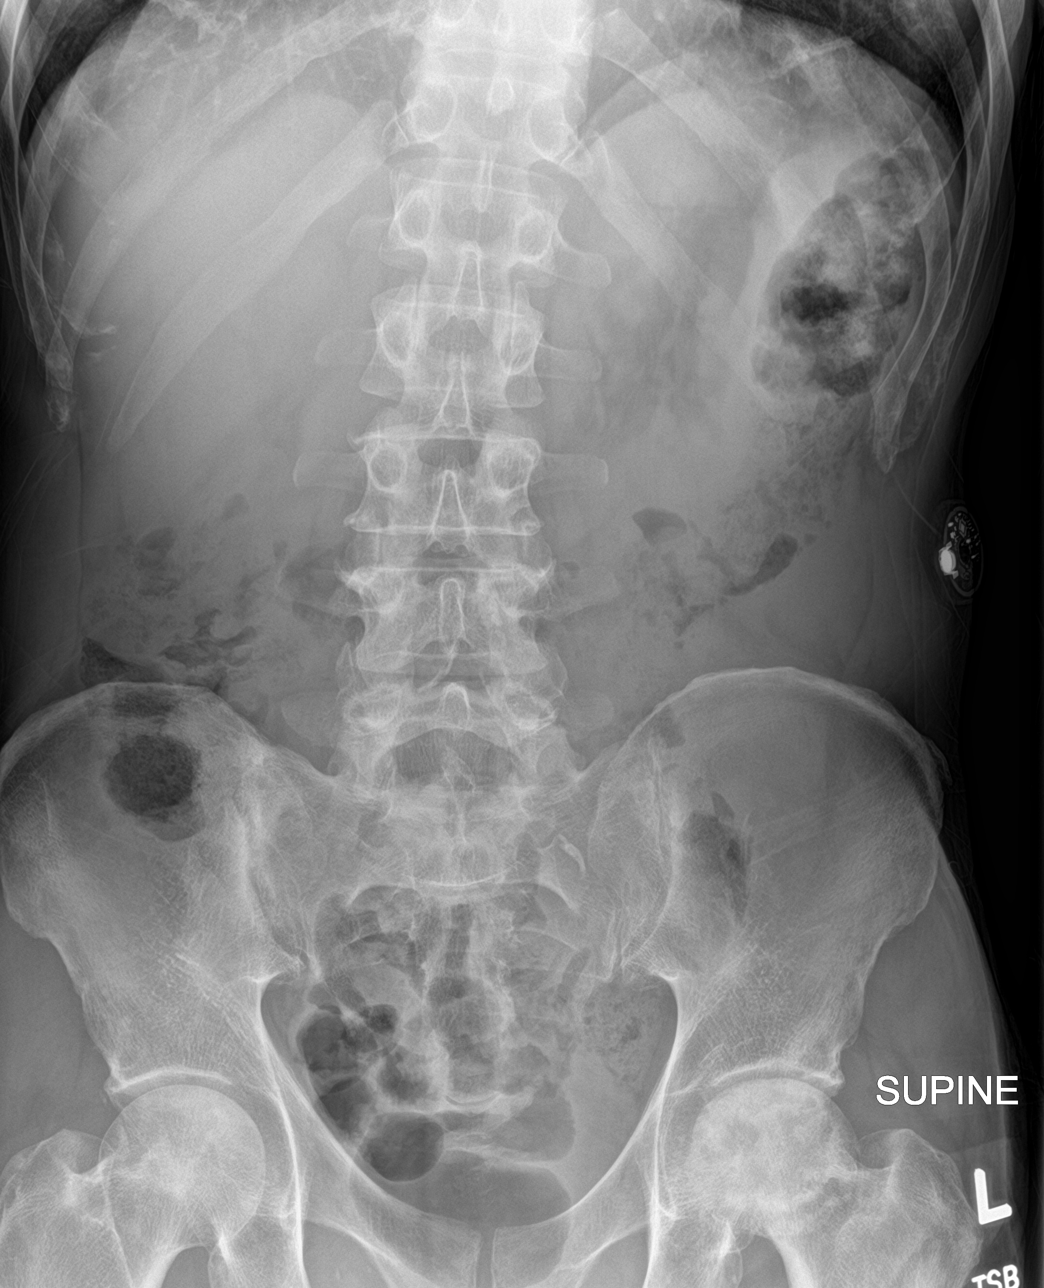

[3 of 3 positions shown; findings below may reference images not displayed]

FINDINGS: There is no evidence of dilated bowel loops or free intraperitoneal
air. No radiopaque calculi or other significant radiographic
abnormality is seen. Heart size and mediastinal contours are within
normal limits. Both lungs are clear. Mixed lucent and sclerotic
changes of the bilateral femoral heads compatible with avascular
necrosis. There is subtle superior surface contour irregularity of
the left femoral head suspicious for early articular collapse.
IMPRESSION: 1. Nonobstructive bowel gas pattern.
2. No acute cardiopulmonary findings.
3. Chronic bilateral femoral head avascular necrosis with suspected
early articular collapse of the left femoral head.

## 2022-02-08 ENCOUNTER — Other Ambulatory Visit: Payer: Self-pay

## 2022-02-08 ENCOUNTER — Inpatient Hospital Stay (HOSPITAL_COMMUNITY)
Admission: EM | Admit: 2022-02-08 | Discharge: 2022-02-13 | DRG: 638 | Disposition: A | Discharge status: Home / Self Care | Payer: BC Managed Care – PPO | Attending: Internal Medicine | Admitting: Internal Medicine

## 2022-02-08 ENCOUNTER — Encounter (HOSPITAL_COMMUNITY): Payer: Self-pay | Admitting: Internal Medicine

## 2022-02-08 ENCOUNTER — Emergency Department (HOSPITAL_COMMUNITY): Payer: BC Managed Care – PPO

## 2022-02-08 DIAGNOSIS — E1110 Type 2 diabetes mellitus with ketoacidosis without coma: Secondary | ICD-10-CM | POA: Diagnosis present

## 2022-02-08 DIAGNOSIS — I10 Essential (primary) hypertension: Secondary | ICD-10-CM | POA: Diagnosis present

## 2022-02-08 DIAGNOSIS — K219 Gastro-esophageal reflux disease without esophagitis: Secondary | ICD-10-CM | POA: Diagnosis present

## 2022-02-08 DIAGNOSIS — E1010 Type 1 diabetes mellitus with ketoacidosis without coma: Principal | ICD-10-CM | POA: Diagnosis present

## 2022-02-08 DIAGNOSIS — Z21 Asymptomatic human immunodeficiency virus [HIV] infection status: Secondary | ICD-10-CM | POA: Diagnosis present

## 2022-02-08 DIAGNOSIS — K920 Hematemesis: Secondary | ICD-10-CM | POA: Diagnosis present

## 2022-02-08 DIAGNOSIS — D696 Thrombocytopenia, unspecified: Secondary | ICD-10-CM | POA: Diagnosis present

## 2022-02-08 DIAGNOSIS — E875 Hyperkalemia: Secondary | ICD-10-CM | POA: Diagnosis present

## 2022-02-08 DIAGNOSIS — Z8249 Family history of ischemic heart disease and other diseases of the circulatory system: Secondary | ICD-10-CM | POA: Diagnosis not present

## 2022-02-08 DIAGNOSIS — Z833 Family history of diabetes mellitus: Secondary | ICD-10-CM | POA: Diagnosis not present

## 2022-02-08 DIAGNOSIS — Z8719 Personal history of other diseases of the digestive system: Secondary | ICD-10-CM

## 2022-02-08 DIAGNOSIS — N179 Acute kidney failure, unspecified: Secondary | ICD-10-CM | POA: Diagnosis present

## 2022-02-08 DIAGNOSIS — R1310 Dysphagia, unspecified: Secondary | ICD-10-CM | POA: Diagnosis present

## 2022-02-08 DIAGNOSIS — E1069 Type 1 diabetes mellitus with other specified complication: Secondary | ICD-10-CM | POA: Diagnosis not present

## 2022-02-08 DIAGNOSIS — Z79899 Other long term (current) drug therapy: Secondary | ICD-10-CM

## 2022-02-08 DIAGNOSIS — Z794 Long term (current) use of insulin: Secondary | ICD-10-CM | POA: Diagnosis not present

## 2022-02-08 DIAGNOSIS — D7589 Other specified diseases of blood and blood-forming organs: Secondary | ICD-10-CM | POA: Diagnosis present

## 2022-02-08 LAB — BASIC METABOLIC PANEL
Anion gap: 13 (ref 5–15)
Anion gap: 23 — ABNORMAL HIGH (ref 5–15)
Anion gap: 14 (ref 5–15)
BUN: 32 mg/dL — ABNORMAL HIGH (ref 6–20)
BUN: 41 mg/dL — ABNORMAL HIGH (ref 6–20)
BUN: 35 mg/dL — ABNORMAL HIGH (ref 6–20)
CO2: 15 mmol/L — ABNORMAL LOW (ref 22–32)
CO2: 8 mmol/L — ABNORMAL LOW (ref 22–32)
CO2: 19 mmol/L — ABNORMAL LOW (ref 22–32)
Calcium: 6.5 mg/dL — ABNORMAL LOW (ref 8.9–10.3)
Calcium: 6.3 mg/dL — CL (ref 8.9–10.3)
Calcium: 7.8 mg/dL — ABNORMAL LOW (ref 8.9–10.3)
Chloride: 112 mmol/L — ABNORMAL HIGH (ref 98–111)
Chloride: 106 mmol/L (ref 98–111)
Chloride: 106 mmol/L (ref 98–111)
Creatinine, Ser: 1.68 mg/dL — ABNORMAL HIGH (ref 0.61–1.24)
Creatinine, Ser: 2.29 mg/dL — ABNORMAL HIGH (ref 0.61–1.24)
Creatinine, Ser: 1.62 mg/dL — ABNORMAL HIGH (ref 0.61–1.24)
GFR, Estimated: 48 mL/min — ABNORMAL LOW (ref >60)
GFR, Estimated: 33 mL/min — ABNORMAL LOW (ref >60)
GFR, Estimated: 50 mL/min — ABNORMAL LOW (ref >60)
Glucose, Bld: 254 mg/dL — ABNORMAL HIGH (ref 70–99)
Glucose, Bld: 493 mg/dL — ABNORMAL HIGH (ref 70–99)
Glucose, Bld: 209 mg/dL — ABNORMAL HIGH (ref 70–99)
Potassium: 3.5 mmol/L (ref 3.5–5.1)
Potassium: 2.9 mmol/L — ABNORMAL LOW (ref 3.5–5.1)
Potassium: 4.2 mmol/L (ref 3.5–5.1)
Sodium: 140 mmol/L (ref 135–145)
Sodium: 137 mmol/L (ref 135–145)
Sodium: 139 mmol/L (ref 135–145)

## 2022-02-08 LAB — COMPREHENSIVE METABOLIC PANEL
ALT: 33 U/L (ref 0–44)
AST: 31 U/L (ref 15–41)
Albumin: 4.5 g/dL (ref 3.5–5.0)
Alkaline Phosphatase: 123 U/L (ref 38–126)
BUN: 54 mg/dL — ABNORMAL HIGH (ref 6–20)
CO2: <7 mmol/L — ABNORMAL LOW (ref 22–32)
Calcium: 9.5 mg/dL (ref 8.9–10.3)
Chloride: 76 mmol/L — ABNORMAL LOW (ref 98–111)
Creatinine, Ser: 3.49 mg/dL — ABNORMAL HIGH (ref 0.61–1.24)
GFR, Estimated: 20 mL/min — ABNORMAL LOW (ref >60)
Glucose, Bld: 977 mg/dL (ref 70–99)
Potassium: 7.5 mmol/L (ref 3.5–5.1)
Sodium: 126 mmol/L — ABNORMAL LOW (ref 135–145)
Total Bilirubin: 2.9 mg/dL — ABNORMAL HIGH (ref 0.3–1.2)
Total Protein: 8 g/dL (ref 6.5–8.1)

## 2022-02-08 LAB — BETA-HYDROXYBUTYRIC ACID: Beta-Hydroxybutyric Acid: >8 mmol/L — ABNORMAL HIGH (ref 0.05–0.27)

## 2022-02-08 LAB — I-STAT VENOUS BLOOD GAS, ED
Acid-base deficit: 21 mmol/L — ABNORMAL HIGH (ref 0.0–2.0)
Acid-base deficit: 21 mmol/L — ABNORMAL HIGH (ref 0.0–2.0)
Bicarbonate: 6.3 mmol/L — ABNORMAL LOW (ref 20.0–28.0)
Bicarbonate: 6.1 mmol/L — ABNORMAL LOW (ref 20.0–28.0)
Calcium, Ion: 1.02 mmol/L — ABNORMAL LOW (ref 1.15–1.40)
Calcium, Ion: 1.02 mmol/L — ABNORMAL LOW (ref 1.15–1.40)
HCT: 51 % (ref 39.0–52.0)
HCT: 48 % (ref 39.0–52.0)
Hemoglobin: 17.3 g/dL — ABNORMAL HIGH (ref 13.0–17.0)
Hemoglobin: 16.3 g/dL (ref 13.0–17.0)
O2 Saturation: 98 %
O2 Saturation: 85 %
Potassium: 7.2 mmol/L (ref 3.5–5.1)
Potassium: 6.9 mmol/L (ref 3.5–5.1)
Sodium: 117 mmol/L — CL (ref 135–145)
Sodium: 116 mmol/L — CL (ref 135–145)
TCO2: 7 mmol/L — ABNORMAL LOW (ref 22–32)
TCO2: 7 mmol/L — ABNORMAL LOW (ref 22–32)
pCO2, Ven: 19.5 mmHg — CL (ref 44–60)
pCO2, Ven: 18.4 mmHg — CL (ref 44–60)
pH, Ven: 7.121 — CL (ref 7.25–7.43)
pH, Ven: 7.126 — CL (ref 7.25–7.43)
pO2, Ven: 132 mmHg — ABNORMAL HIGH (ref 32–45)
pO2, Ven: 63 mmHg — ABNORMAL HIGH (ref 32–45)

## 2022-02-08 LAB — URINALYSIS, ROUTINE W REFLEX MICROSCOPIC
Bilirubin Urine: NEGATIVE
Glucose, UA: >=500 mg/dL — AB
Ketones, ur: 80 mg/dL — AB
Leukocytes,Ua: NEGATIVE
Nitrite: NEGATIVE
Protein, ur: 30 mg/dL — AB
Specific Gravity, Urine: 1.017 (ref 1.005–1.030)
pH: 5 (ref 5.0–8.0)

## 2022-02-08 LAB — CBC WITH DIFFERENTIAL/PLATELET
Abs Immature Granulocytes: 0.03 10*3/uL (ref 0.00–0.07)
Basophils Absolute: 0 10*3/uL (ref 0.0–0.1)
Basophils Relative: 0 %
Eosinophils Absolute: 0 10*3/uL (ref 0.0–0.5)
Eosinophils Relative: 0 %
HCT: 43.4 % (ref 39.0–52.0)
Hemoglobin: 13.8 g/dL (ref 13.0–17.0)
Immature Granulocytes: 0 %
Lymphocytes Relative: 3 %
Lymphs Abs: 0.3 10*3/uL — ABNORMAL LOW (ref 0.7–4.0)
MCH: 33.4 pg (ref 26.0–34.0)
MCHC: 31.8 g/dL (ref 30.0–36.0)
MCV: 105.1 fL — ABNORMAL HIGH (ref 80.0–100.0)
Monocytes Absolute: 0.4 10*3/uL (ref 0.1–1.0)
Monocytes Relative: 3 %
Neutro Abs: 10.9 10*3/uL — ABNORMAL HIGH (ref 1.7–7.7)
Neutrophils Relative %: 94 %
Platelets: 228 10*3/uL (ref 150–400)
RBC: 4.13 MIL/uL — ABNORMAL LOW (ref 4.22–5.81)
RDW: 12.3 % (ref 11.5–15.5)
WBC: 11.6 10*3/uL — ABNORMAL HIGH (ref 4.0–10.5)
nRBC: 0 % (ref 0.0–0.2)

## 2022-02-08 LAB — PROTIME-INR
INR: 1 (ref 0.8–1.2)
Prothrombin Time: 12.8 seconds (ref 11.4–15.2)

## 2022-02-08 LAB — CBG MONITORING, ED
Glucose-Capillary: >600 mg/dL (ref 70–99)
Glucose-Capillary: >600 mg/dL (ref 70–99)
Glucose-Capillary: >600 mg/dL (ref 70–99)
Glucose-Capillary: >600 mg/dL (ref 70–99)
Glucose-Capillary: 542 mg/dL (ref 70–99)
Glucose-Capillary: >600 mg/dL (ref 70–99)
Glucose-Capillary: 458 mg/dL — ABNORMAL HIGH (ref 70–99)
Glucose-Capillary: 408 mg/dL — ABNORMAL HIGH (ref 70–99)
Glucose-Capillary: 474 mg/dL — ABNORMAL HIGH (ref 70–99)
Glucose-Capillary: 369 mg/dL — ABNORMAL HIGH (ref 70–99)
Glucose-Capillary: 326 mg/dL — ABNORMAL HIGH (ref 70–99)
Glucose-Capillary: 280 mg/dL — ABNORMAL HIGH (ref 70–99)
Glucose-Capillary: 247 mg/dL — ABNORMAL HIGH (ref 70–99)

## 2022-02-08 LAB — APTT: aPTT: 28 seconds (ref 24–36)

## 2022-02-08 LAB — LACTIC ACID, PLASMA
Lactic Acid, Venous: 3.6 mmol/L (ref 0.5–1.9)
Lactic Acid, Venous: 3.3 mmol/L (ref 0.5–1.9)

## 2022-02-08 LAB — MAGNESIUM
Magnesium: 1.5 mg/dL — ABNORMAL LOW (ref 1.7–2.4)
Magnesium: 1.7 mg/dL (ref 1.7–2.4)

## 2022-02-08 LAB — TROPONIN I (HIGH SENSITIVITY)
Troponin I (High Sensitivity): 11 ng/L (ref <18)
Troponin I (High Sensitivity): 9 ng/L (ref <18)

## 2022-02-08 MED ORDER — SODIUM CHLORIDE 0.9 % IV BOLUS
1000.0000 mL | Freq: Once | INTRAVENOUS | Status: AC
  Administered 2022-02-08: 1000 mL via INTRAVENOUS

## 2022-02-08 MED ORDER — DEXTROSE IN LACTATED RINGERS 5 % IV SOLN: INTRAVENOUS | Status: DC

## 2022-02-08 MED ORDER — POTASSIUM CHLORIDE 10 MEQ/100ML IV SOLN
10.0000 meq | INTRAVENOUS | Status: AC
  Administered 2022-02-08 – 2022-02-09 (×4): 10 meq via INTRAVENOUS
  Filled 2022-02-08 (×3): qty 100

## 2022-02-08 MED ORDER — PANTOPRAZOLE SODIUM 40 MG PO TBEC
40.0000 mg | DELAYED_RELEASE_TABLET | Freq: Every day | ORAL | Status: DC
  Administered 2022-02-08 – 2022-02-09 (×2): 40 mg via ORAL
  Filled 2022-02-08 (×2): qty 1

## 2022-02-08 MED ORDER — ALBUTEROL SULFATE (2.5 MG/3ML) 0.083% IN NEBU
10.0000 mg | INHALATION_SOLUTION | Freq: Once | RESPIRATORY_TRACT | Status: AC
  Administered 2022-02-08: 10 mg via RESPIRATORY_TRACT
  Filled 2022-02-08: qty 12

## 2022-02-08 MED ORDER — CALCIUM GLUCONATE-NACL 1-0.675 GM/50ML-% IV SOLN
1.0000 g | Freq: Once | INTRAVENOUS | Status: AC
  Administered 2022-02-08: 1000 mg via INTRAVENOUS
  Filled 2022-02-08: qty 50

## 2022-02-08 MED ORDER — HEPARIN SODIUM (PORCINE) 5000 UNIT/ML IJ SOLN
5000.0000 [IU] | Freq: Three times a day (TID) | INTRAMUSCULAR | Status: DC
  Administered 2022-02-08 – 2022-02-10 (×6): 5000 [IU] via SUBCUTANEOUS
  Filled 2022-02-08 (×7): qty 1

## 2022-02-08 MED ORDER — DEXTROSE 50 % IV SOLN: 0.0000 mL | INTRAVENOUS | Status: DC | PRN

## 2022-02-08 MED ORDER — LACTATED RINGERS IV SOLN: INTRAVENOUS | Status: DC

## 2022-02-08 MED ORDER — LACTATED RINGERS IV BOLUS
20.0000 mL/kg | Freq: Once | INTRAVENOUS | Status: AC
  Administered 2022-02-08: 1270 mL via INTRAVENOUS

## 2022-02-08 MED ORDER — INSULIN REGULAR(HUMAN) IN NACL 100-0.9 UT/100ML-% IV SOLN
INTRAVENOUS | Status: DC
  Administered 2022-02-08: 3.8 [IU]/h via INTRAVENOUS
  Administered 2022-02-08: 3.6 [IU]/h via INTRAVENOUS
  Filled 2022-02-08 (×2): qty 100

## 2022-02-08 NOTE — ED Provider Notes (Signed)
[facility] EMERGENCY DEPARTMENT Provider Note   CSN: [id number] Arrival date & time: 02/08/22  0940     History  Chief Complaint  Patient presents with   Shortness of Breath   Emesis   Nausea   Fatigue   Chest Pain    Carl Reed is a 56 y.o. male history includes type 1 diabetes, hypertension, HIV.  Patient presents to the ER today for evaluation of nausea vomiting abdominal pain chest pain.  Patient reports is in her normal state of health last week, on Sunday night he developed nausea and multiple episodes of emesis he reports some dark brown emesis as well which which they were concerned for possible blood. No bright red emesis.  He reports this was followed by abdominal pain which is cramping pain worsened with emesis improves somewhat with rest.  Around 2 days ago he also developed some chest pain that he reports is only occurs with the vomit as well.  He reports he has been unable to take his insulin over the past 4 days due to his illness.  He does report drinking alcohol daily "a couple of beers" he has not drink any alcohol in the past 4 days.  Patient was brought in by his son today who provides supplemental history.  I initially evaluated this patient in triage, vital signs were significant for tachycardia rate 117 bpm.  He was afebrile in triage with oral temperature of 97 F.  Respirations and SPO2 within normal limits.  He is mildly hypertensive.  Labs were ordered and an IV was started in triage patient has received around 1 L normal saline at this point.  On initial evaluation in main ER patient reports he is feeling improved.  Patient had been somewhat somnolent and difficult historian in triage.  At this time he is fully alert and oriented and ambulating around the room with his son.  He denies any additional concerns at this time.  HPI     Home Medications Prior to Admission medications   Medication Sig Start Date End Date Taking? Authorizing  Provider  abacavir-dolutegravir-lamiVUDine (TRIUMEQ) 600-50-300 MG tablet Take 1 tablet by mouth daily. 03/29/20   [name], MD  insulin glargine, 2 Unit Dial, (TOUJEO MAX) 300 UNIT/ML Solostar Pen Inject 16 Units into the skin daily. 03/28/20   [name], MD  insulin lispro (HUMALOG) 100 UNIT/ML injection Continue as previously prescribed by [facility] endocrinologist depending on the scale 03/28/20 03/28/21  [name], MD      Allergies    Patient has no known allergies.    Review of Systems   Review of Systems Ten systems are reviewed and are negative for acute change except as noted in the HPI  Physical Exam Updated Vital Signs BP (!) 157/71   Pulse (!) 120   Temp 97.7 F (36.5 C)   Resp 19   Ht 5\' 11"  (1.803 m)   Wt 63.5 kg   SpO2 100%   BMI 19.53 kg/m  Physical Exam Constitutional:      General: He is not in acute distress.    Appearance: Normal appearance. He is well-developed. He is ill-appearing. He is not toxic-appearing or diaphoretic.  HENT:     Head: Normocephalic and atraumatic.     Mouth/Throat:     Mouth: Mucous membranes are dry.     Pharynx: Oropharynx is clear.  Eyes:     General: Vision grossly intact. Gaze aligned appropriately.  Pupils: Pupils are equal, round, and reactive to light.  Neck:     Trachea: Trachea and phonation normal.  Pulmonary:     Effort: Pulmonary effort is normal. No respiratory distress.  Abdominal:     General: There is no distension.     Palpations: Abdomen is soft.     Tenderness: There is generalized abdominal tenderness. There is no guarding or rebound.  Musculoskeletal:        General: Normal range of motion.     Cervical back: Normal range of motion.  Skin:    General: Skin is warm and dry.  Neurological:     Mental Status: He is alert.     GCS: GCS eye subscore is 4. GCS verbal subscore is 5. GCS motor subscore is 6.     Comments: Speech is clear and goal oriented, follows  commands Major Cranial nerves without deficit, no facial droop Moves extremities without ataxia, coordination intact  Psychiatric:        Behavior: Behavior normal.     ED Results / Procedures / Treatments   Labs (all labs ordered are listed, but only abnormal results are displayed) Labs Reviewed  LACTIC ACID, PLASMA - Abnormal; Notable for the following components:      Result Value   Lactic Acid, Venous 3.6 (*)    All other components within normal limits  COMPREHENSIVE METABOLIC PANEL - Abnormal; Notable for the following components:   Sodium 126 (*)    Potassium 7.5 (*)    Chloride 76 (*)    CO2 <7 (*)    Glucose, Bld 977 (*)    BUN 54 (*)    Creatinine, Ser 3.49 (*)    Total Bilirubin 2.9 (*)    GFR, Estimated 20 (*)    All other components within normal limits  CBC WITH DIFFERENTIAL/PLATELET - Abnormal; Notable for the following components:   WBC 11.6 (*)    RBC 4.13 (*)    MCV 105.1 (*)    Neutro Abs 10.9 (*)    Lymphs Abs 0.3 (*)    All other components within normal limits  URINALYSIS, ROUTINE W REFLEX MICROSCOPIC - Abnormal; Notable for the following components:   Color, Urine STRAW (*)    Glucose, UA >=500 (*)    Hgb urine dipstick SMALL (*)    Ketones, ur 80 (*)    Protein, ur 30 (*)    Bacteria, UA RARE (*)    All other components within normal limits  BETA-HYDROXYBUTYRIC ACID - Abnormal; Notable for the following components:   Beta-Hydroxybutyric Acid >8.00 (*)    All other components within normal limits  CBG MONITORING, ED - Abnormal; Notable for the following components:   Glucose-Capillary >600 (*)    All other components within normal limits  I-STAT VENOUS BLOOD GAS, ED - Abnormal; Notable for the following components:   pH, Ven 7.121 (*)    pCO2, Ven 19.5 (*)    pO2, Ven 132 (*)    Bicarbonate 6.3 (*)    TCO2 7 (*)    Acid-base deficit 21.0 (*)    Sodium 117 (*)    Potassium 7.2 (*)    Calcium, Ion 1.02 (*)    Hemoglobin 17.3 (*)    All  other components within normal limits  CBG MONITORING, ED - Abnormal; Notable for the following components:   Glucose-Capillary >600 (*)    All other components within normal limits  CBG MONITORING, ED - Abnormal; Notable for the following components:  Glucose-Capillary >600 (*)    All other components within normal limits  CBG MONITORING, ED - Abnormal; Notable for the following components:   Glucose-Capillary >600 (*)    All other components within normal limits  I-STAT VENOUS BLOOD GAS, ED - Abnormal; Notable for the following components:   pH, Ven 7.126 (*)    pCO2, Ven 18.4 (*)    pO2, Ven 63 (*)    Bicarbonate 6.1 (*)    TCO2 7 (*)    Acid-base deficit 21.0 (*)    Sodium 116 (*)    Potassium 6.9 (*)    Calcium, Ion 1.02 (*)    All other components within normal limits  CULTURE, BLOOD (ROUTINE X 2)  CULTURE, BLOOD (ROUTINE X 2)  PROTIME-INR  APTT  LACTIC ACID, PLASMA  BASIC METABOLIC PANEL  TROPONIN I (HIGH SENSITIVITY)  TROPONIN I (HIGH SENSITIVITY)    EKG EKG Interpretation  Date/Time:  Wednesday February 08 2022 13:14:42 EST Ventricular Rate:  110 PR Interval:  157 QRS Duration: 79 QT Interval:  331 QTC Calculation: 448 R Axis:   65 Text Interpretation: Sinus tachycardia Ventricular premature complex Right atrial enlargement Anteroseptal infarct, old No significant change since last tracing Confirmed by Jacalyn Lefevre [phone]) on 02/08/2022 1:19:05 PM  Radiology DG Chest Port 1 View  Result Date: 02/08/2022 CLINICAL DATA:  Sepsis EXAM: PORTABLE CHEST 1 VIEW COMPARISON:  None Available. FINDINGS: No pleural effusion. No pneumothorax. No focal airspace opacity. Unchanged cardiac and mediastinal contours. No displaced rib fractures. Visualized upper abdomen is unremarkable. IMPRESSION: No focal airspace opacity. Electronically Signed   By: Lorenza Cambridge M.D.   On: 02/08/2022 13:01    Procedures .Critical Care  Performed by: Bill Salinas, PA-C Authorized  by: Bill Salinas, PA-C   Critical care provider statement:    Critical care time (minutes):  45   Critical care was necessary to treat or prevent imminent or life-threatening deterioration of the following conditions: DKA.   Critical care was time spent personally by me on the following activities:  Development of treatment plan with patient or surrogate, discussions with consultants, evaluation of patient's response to treatment, examination of patient, ordering and review of laboratory studies, ordering and review of radiographic studies, ordering and performing treatments and interventions, pulse oximetry, re-evaluation of patient's condition, review of old charts, obtaining history from patient or surrogate and blood draw for specimens   Care discussed with: admitting provider       Medications Ordered in ED Medications  insulin regular, human (MYXREDLIN) 100 units/ 100 mL infusion (3.6 Units/hr Intravenous Rate/Dose Verify 02/08/22 1433)  lactated ringers infusion ( Intravenous New Bag/Given 02/08/22 1340)  dextrose 5 % in lactated ringers infusion (0 mLs Intravenous Hold 02/08/22 1312)  dextrose 50 % solution 0-50 mL (has no administration in time range)  heparin injection 5,000 Units (has no administration in time range)  sodium chloride 0.9 % bolus 1,000 mL (0 mLs Intravenous Stopped 02/08/22 1340)  lactated ringers bolus 1,270 mL (1,270 mLs Intravenous New Bag/Given 02/08/22 1340)  albuterol (PROVENTIL) (2.5 MG/3ML) 0.083% nebulizer solution 10 mg (10 mg Nebulization Given 02/08/22 1406)  calcium gluconate 1 g/ 50 mL sodium chloride IVPB (0 mg Intravenous Stopped 02/08/22 1426)    ED Course/ Medical Decision Making/ A&P Clinical Course as of 02/08/22 1511  Wed Feb 08, 2022  1322 pH, Ven(!!): 7.121 pH 7.121, suspect due to DKA [BM]  1323 Potassium(!!): 7.2 Potassium noted 7.2 [BM]  1323 Lactic Acid, Venous(!!): 3.6  Lactic elevated 3.6 likely due to dehydration and DKA [BM]  1323  WBC(!): 11.6 Mildly elevated WBC, no infectious symptoms to suggest sepsis at this point.  Suspect due to DKA. [BM]  1324 Glucose-Capillary(!!): >600 CBG greater than 600. [BM]  1324 [facility] I personally reviewed and interpreted AP chest radiograph, I do not appreciate any obvious PTX, PNA, effusion or other acute cardiopulmonary process. [BM]  1326 EKG 12-Lead I personally reviewed and interpreted patient's twelve-lead EKG.  I do not appreciate any obvious acute ischemic changes.  Shows sinus tachycardia with peaked T waves consistent with hyperkalemia. [BM]  1345 Comprehensive metabolic panel(!!) CMP notable for hyperkalemia 7.5, glucose 977 and AKI with creatinine 3.49/BUN 54. [BM]  1352 1:52 PM: Case discussed with medicine team, Dr. August Saucerean who accepted patient for admission.  Patient has received insulin at this time, we will add calcium and albuterol to help with hyperkalemia per medicine team. [BM]  1503 Urinalysis, Routine w reflex microscopic Urine, Clean Catch(!) Urinalysis notable for 80 ketones and greater than 500 glucose.  No evidence for UTI. [BM]    Clinical Course User Index [BM] Elizabeth PalauMorelli, Christeena Krogh A, PA-C                           Medical Decision Making [age] year old male history diabetes, HIV, hypertension presented for abdominal pain nausea vomiting chest pain, intermittent altered mental status ongoing for the past 3 days.  Differential diagnosis includes but not limited to DKA, cholecystitis, appendicitis, SBO, dissection, esophageal hemorrhage, PTX, PNA, alcohol withdrawal, dehydration.  Screening labs and IV fluids were given in triage.  Patient appears to be improving upon reevaluation in main ER.  He has had no recurrence of emesis.  Possible patient had Mallory-Weiss tear earlier.  Abdomen is soft nontender.  Will continue to monitor.  Low suspicion for hemorrhage at this time. No history of bright red emesis. Hemoglobin within normal limits.  Amount and/or  Complexity of Data Reviewed Independent Historian:     Details: Patient's son at bedside External Data Reviewed: notes.    Details: PCP and endocrinology notes Labs: ordered. Decision-making details documented in ED Course. Radiology: ordered and independent interpretation performed. Decision-making details documented in ED Course. ECG/medicine tests: ordered and independent interpretation performed. Decision-making details documented in ED Course. Discussion of management or test interpretation with external provider(s): Discussed case with attending physician Dr. Particia NearingHaviland. Discussed case with admitting physician Dr. August Saucerean.  Risk Prescription drug management. Decision regarding hospitalization. Risk Details: Patient found to be in diabetic ketoacidosis, DKA order set was utilized, patient was started on Endo tool.  He was also found to be significant hyperkalemic with a potassium of 7.5.  Albuterol and calcium gluconate were ordered after consultation with medicine team.  Patient was reassessed multiple times during this visit he remains stable on room air no acute distress appears to be improving with IV fluids.  Patient reports that his pain is improving.  No additional concerns at this time.  Patient states understanding of plan of care and he is agreeable for admission.  Patient's daughter at bedside states understanding as well all questions answered.  Critical Care Total time providing critical care: 45 minutes   She was seen and evaluated by Dr. Shirlee LimerickHavlin during this visit who agrees with plan of care.  Note: Portions of this report may have been transcribed using voice recognition software. Every effort was made to ensure accuracy; however, inadvertent computerized transcription errors  may still be present.         Final Clinical Impression(s) / ED Diagnoses Final diagnoses:  Diabetic ketoacidosis without coma associated with type 1 diabetes mellitus (HCC)  Hyperkalemia     Rx / DC Orders ED Discharge Orders     None         Elizabeth Palau 02/08/22 1513    Jacalyn Lefevre, MD 02/08/22 1534

## 2022-02-08 NOTE — ED Notes (Signed)
Pt being changed into a gown & placed on cardiac monitor at this time.

## 2022-02-08 NOTE — Hospital Course (Addendum)
11/12: still ahving some discomfort but it's slowly getting better. Shakes are going well for him. Dull pain with hot;warm foods over that one spot, nothing like yesterday with the acidic foods. His pain is manageable. Pudding also good.    11/10: feels better than last night. The pain he is having is more like a pressure. Happens when drinking water, after eating, even off just saliva. Not really able to eat much--half a protein shake. Soft/liquids better than solids. A week or two ago he didn't have pain with eating but did have globus sensation in chest. Pain started since starting vomiting so much. Globus sensation has gotten worse lately. Pressure sensation at home too but was not previously persistent. Maalox did help a little yesterday.   Over last several days, has been unable to eat, throwing up anything he takes PO, CP, throat pain. Started Sunday. Had some brown vomiting with blood? Phylliss Blakes ran out. Since vomiting he stopped taking normal amount of insulin. Chest pain in middle of the chest Tuesday after vomiting constantly Monday-Tuesday. Sharp, worse with breathing and vomiting.  He said this is the worst he's ever felt. No diarrhea but weakness, fatigue, cough. No recent illnesses. Sweating/chill. Thinks he has lost 20-30 lbs in last few days??  Pmh: Diabetes Neuropathy Anemia Htn Hiv--wife knows but not kids  Psh: none  Meds: Humalog 16 units Humalog  Lisinopril 2.5 mg daily Doesn't know name of current HIV medication. Starts with A now. Weaned self off of metformin but sometimes takes it?? 500 mg BID Gabapentin 600 mg tid trazodone  Allergies:   Famhx: Mom had HTN, diabetes Dad no known history Brother diabetes Kids are healthy  Sochx: Drinks 1-2 beers per day, maybe 3. 2+shots per day, sometimes will skip a day. Finishes whatever bacardi is left over from weekends on weekdays. Drinks a pint of liquor on weekends. Last drink? Saturday night Ever had  withdrawal? No Has been in rehab before. 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 Smokes tobacco, since age 48, has also vaped. Just started nicotine patch.  No illicit substances now. Works for [employer]. Lives in [place].   -schedule bmp's  11/9- Indigestion No vomiting Sore substernal area Chest pain somewhat better EtOH w/o much po, then got sick and took off freestyle. Only taking small amount of insulin  Does have fingerstick at home but needs strips  Presented with several days of nausea and vomiting, found to have severe DKA with hyperglycemia and electrolyte disarray. Last A1c 01/17/2022 at outside provider 8.7. Mr. Coln reports he does have additional CGM sensors that he can place at discharge and has a backup glucometer if needed. Initially reported having poor appetite and drinking alcohol over the weekend. Due to these symptoms he was giving himself less insulin and developed nausea and vomiting. He was not measuring his blood sugars during this time as his CGM sensor was removed and he did not replace it. On admission, patient was tachycardic, hypertensive, afebrile.  Initial labs notable for bicarb 15, glucose 254, creatinine 1.68, calcium 6.5, anion gap 13, magnesium 1.5.  About 45 minutes later labs significantly worsened with potassium 7.5, chloride 76, CO2 undetectable, glucose 977, creatinine 3.49 and anion gap unable to be calculated.  Lactic acid 3.6.  CBC with mild leukocytosis 11.6.  Troponin negative.  Beta hydroxybutyrate greater than 8.  pH 7.12 with PCO2 18.  In the ED patient was treated with IV fluids and started on insulin infusion.  He was given calcium gluconate for severe hyperkalemia.  EKG showing sinus tach and evidence of peaked T waves.  UA positive for glucosuria and ketones, not consistent with infection.     HD 2 electrolyte disarray had significantly improved, anion gap had closed and bicarb within normal range.  Glucose in the 150s to 180s.  AKI  significantly improved, near patient baseline.  History sounds most consistent with intoxication and poor PO intake leading to reduced insulin intake, contributing to severe DKA.

## 2022-02-08 NOTE — ED Notes (Signed)
This Rn calls to see if the lab can add on a magnesium to the labs that were sent at 2031. They respond yes at this time

## 2022-02-08 NOTE — ED Provider Triage Note (Addendum)
Emergency Medicine Provider Triage Evaluation Note  Carl Reed , a 56 y.o. male  was evaluated in triage.  Type I diabetic brought in by family today.  Majority history obtained from family.  Patient with intermittent AMS x3 days.  He has been experiencing nausea vomiting and scant hematemesis as well.  No diarrhea.  Patient has taken his insulin today.  Associated chest pain and shortness of breath.  Review of Systems  Positive:     AMS, nausea, vomiting, hematemesis, chest pain, shortness of breath Negative:   Level 5 caveat AMS  Physical Exam  BP (!) 157/91 (BP Location: Right Arm)   Pulse (!) 117   Resp 20   Ht 5\' 11"  (1.803 m)   Wt 63.5 kg   SpO2 100%   BMI 19.53 kg/m  Gen:   Awake, responds to verbal stimuli, Resp:  Normal effort  MSK:   Moves extremities without difficulty  Other: Mildly tender abdomen without peritoneal signs.   Medical Decision Making  Medically screening exam initiated at 11:41 AM.  Appropriate orders placed.  Amad Becker was informed that the remainder of the evaluation will be completed by another provider, this initial triage assessment does not replace that evaluation, and the importance of remaining in the ED until their evaluation is complete.  Patient being brought back to room in main ED as soon as possible, charge RN aware.  Patient began with abdominal pain and chest pain followed.  No clear infectious symptoms.  Afebrile.  Does not meet code sepsis criteria at this time.  IV fluids ordered.  Blood work ordered.    Note: Portions of this report may have been transcribed using voice recognition software. Every effort was made to ensure accuracy; however, inadvertent computerized transcription errors may still be present.    , PA-C 02/08/22 1148

## 2022-02-08 NOTE — ED Notes (Signed)
This tech disposed of patient emesis bag, noted blood tinged sputum

## 2022-02-08 NOTE — H&P (Cosign Needed Addendum)
Date: 02/08/2022               Patient Name:  Carl Reed MRN: [id number]  DOB: 16-Jun-1965 Age / Sex: 56 y.o., male   PCP: Patient, No Pcp Per              Medical Service: Internal Medicine Teaching Service              Attending Physician: Dr. Dickie La, MD    First Contact: Vidal Schwalbe, MS 3 Pager: [phone]  Second Contact: Dr. [name] Pager: [phone]  Third Contact Dr. Champ Mungo Pager: [phone]       After Hours (After 5p/  First Contact Pager: [phone]  weekends / holidays): Second Contact Pager: [phone]   Chief Complaint: Nausea and vomiting   History of Present Illness:   Carl Reed is a 56 y.o. male with a past medical history of anemia, T1DM, neuropathy, HTN, GERD, and HIV presents for 4 days of nausea and vomiting since Sunday, 11/5. He reports his freestyle Josephine Igo "ran out" on Saturday, 11/4.  Since Sunday, he has experienced vomiting, shortness of breath, chills, headache, epigastric abdominal pain, and weakness. He also notes sharp mid sternal chest pain that is worse with vomiting, deep breathing, and is reproducible with palpation. Denies history of chest pain. He also has throat pain from profuse vomiting. He reports he has been unable to keep food or liquids down since Sunday. His vomitus was initially clear or containing food particles, but by Monday, he noted his vomitus appeared dark brown with blood he describes as streaks. No bright red blood. He believes he has lost 20-30 pounds since Sunday due profuse vomiting. He took a little bit of his normal insulin Sunday afternoon, but since Monday, he has taken less of his insulin because he was unable to keep food down. Denies diarrhea, constipation, cough, sick contacts, or recent illness. He reports a hospital admission around Christmas time two years ago where he experienced similar nausea, weakness, chest pain, chills, and headache. He reports this episode is much worse, the "worst he has ever felt".   ED  course: Initial glucose 977.  Lactate 3.6.  K+ 7.5.  Sodium 126.  Tachycardic to 117.  CO2 <7.  Macrocytosis.  Troponin 11.  Beta hydroxybutyric acid >8.  Blood gas with pH 7.12. Afebrile. Mildly hypertensive. Received 1L bolus normal saline.  Received calcium gluconate 1 g, albuterol neb 10 mg, placed on LR infusions 125 mL/h.  Patient placed on Endo tool.   Meds:  No outpatient medications have been marked as taking for the 02/08/22 encounter [facility] Encounter).     Allergies: Allergies as of 02/08/2022   (No Known Allergies)   Past Medical History:  Diagnosis Date   Diabetes mellitus without complication (HCC)     Family History:  - Mother: DM, HTN - Father: No known medical issues - Brother: DM - Aunt: cancer - Children: No medical issues    Social History:  - Job: Works for [employer] in [place], [place] - Home: Lives in [place] with his father and wife - Alcohol: Drinks 1-2 beers per day, sometimes 3. Also drinks 2+ shots of Bacardi some days. Drinks a pint of liquor on the weekend. He has never experienced withdrawal. Last drink Saturday night. - Tobacco: Smoked tobacco since age 68, has also vaped. He now uses a nicotine patch.  - Drugs: None now  Review of Systems: A complete ROS was negative except as  per HPI.   Physical Exam: Blood pressure (!) 169/82, pulse (!) 114, temperature 98.3 F (36.8 C), temperature source Oral, resp. rate 18, height 5\' 11"  (1.803 m), weight 63.5 kg, SpO2 100 %.  General: Uncomfortable-appearing; No acute distress HEENT: Normocephalic, atraumatic; Dry mucous membranes; Sclera non-icteric  Cardiovascular: Tachycardic, regular rhythm; No murmurs, rubs, or gallops; 2+ radial and dp pulses bilaterally  Pulmonary: Normal respiratory effort; Lungs clear to auscultation bilaterally; No wheezes rales or rhonchi Abdomen: Epigastric tenderness to palpation, no rebound or guarding; soft, non-distended; no masses or organomegaly   Extremities: No lower extremity edema  Skin: Warm and dry; Poor skin turgor   EKG: personally reviewed my interpretation is sinus tachycardia, normal axis   Assessment & Plan by Problem: Principal Problem:   DKA (diabetic ketoacidosis) (HCC)  Carl Reed is a 56 y.o. male with a past medical history of anemia, T1DM, neuropathy, HTN, GERD presenting for 4 days of nausea and vomiting, admitted for medical management of DKA.  #Severe DKA #T1DM The patient presents with 4 days of nausea and vomiting, epigastric pain, and weakness, in the setting of a malfunctioning freestyle libre glucose monitor and not taking insulin regularly. On admission, his blood glucose was 977, BHB > 8.00, bicarb <7, with a pH of 7.126. He was started on endotool, and is receiving LR infusion. Will have regular CBG checks to monitor. Once anion gap closed 2, dc endotool and start CM diet and transition to Lantus.  - Continue Endotool for now - IV LR 125 mL/hr  until CBG less than 250 - NPO - CBG q2  #Hematemesis The patient reports his vomitus started clear several days ago and then became dark brown. No bloody emesis since presentation. Last RUQ 57 in 2021 without signs of liver disease. LFTs are within normal limits, and his exam was negative for stigmata of liver disease, so I have low suspicion for bleeding esophageal varices. He reports throat pain from profuse vomiting, suspicious for Mallory-Weiss tears. Differential also includes upper GI bleed, gastric ulcer. Tenderness to palpation at epigastrium. Tachycardic, but otherwise HDS. -start protonix 40 mg daily -continue to monitor for return of symptoms -if additional episode, stat CBC  #Hyperkalemia  Potassium is 7.5 in the setting of DKA. He has received calcium gluconate 1g IV and albuterol nebulizer 10 mg in addition to insulin drip. Will continue to monitor BMP and have continuous cardiac monitoring for arrhythmias.  - continuous cardiac monitoring   - BMP q2  HIV Unclear what medications patient is currently taking. Will verify with pharmacy.  Attestation for Student Documentation:  I personally was present and performed or re-performed the history, physical exam and medical decision-making activities of this service and have verified that the service and findings are accurately documented in the student's note.  2022, MD 02/08/2022, 6:37 PM   Dispo: Admit patient to Inpatient with expected length of stay greater than 2 midnights.  Signed: 13/11/2021, Medical Student 02/08/2022, 5:06 PM

## 2022-02-08 NOTE — ED Notes (Signed)
Providers notified of HTN and tachycardia at this time

## 2022-02-08 NOTE — ED Triage Notes (Signed)
Daughter/son stated, He has N/V with chest pain , SOB and Fatigue this started about 3 days ago. Took last night 5 units Humilog and 18 u of long lasting.Marland Kitchen

## 2022-02-08 NOTE — ED Notes (Signed)
Admitting team at bedside.

## 2022-02-08 NOTE — ED Notes (Signed)
Provider notified of the need for additional lab order due to difficult situation with the laboratory processing system. Provider verbalizes understanding of delay of care.

## 2022-02-09 DIAGNOSIS — E1069 Type 1 diabetes mellitus with other specified complication: Secondary | ICD-10-CM

## 2022-02-09 DIAGNOSIS — Z794 Long term (current) use of insulin: Secondary | ICD-10-CM

## 2022-02-09 LAB — BASIC METABOLIC PANEL
Anion gap: 10 (ref 5–15)
Anion gap: 9 (ref 5–15)
Anion gap: 7 (ref 5–15)
Anion gap: 9 (ref 5–15)
BUN: 33 mg/dL — ABNORMAL HIGH (ref 6–20)
BUN: 31 mg/dL — ABNORMAL HIGH (ref 6–20)
BUN: 21 mg/dL — ABNORMAL HIGH (ref 6–20)
BUN: 18 mg/dL (ref 6–20)
CO2: 23 mmol/L (ref 22–32)
CO2: 24 mmol/L (ref 22–32)
CO2: 27 mmol/L (ref 22–32)
CO2: 27 mmol/L (ref 22–32)
Calcium: 9.2 mg/dL (ref 8.9–10.3)
Calcium: 9.1 mg/dL (ref 8.9–10.3)
Calcium: 9.3 mg/dL (ref 8.9–10.3)
Calcium: 9.5 mg/dL (ref 8.9–10.3)
Chloride: 102 mmol/L (ref 98–111)
Chloride: 104 mmol/L (ref 98–111)
Chloride: 104 mmol/L (ref 98–111)
Chloride: 101 mmol/L (ref 98–111)
Creatinine, Ser: 1.69 mg/dL — ABNORMAL HIGH (ref 0.61–1.24)
Creatinine, Ser: 1.6 mg/dL — ABNORMAL HIGH (ref 0.61–1.24)
Creatinine, Ser: 1.17 mg/dL (ref 0.61–1.24)
Creatinine, Ser: 1.17 mg/dL (ref 0.61–1.24)
GFR, Estimated: 48 mL/min — ABNORMAL LOW (ref >60)
GFR, Estimated: 51 mL/min — ABNORMAL LOW (ref >60)
GFR, Estimated: >60 mL/min (ref >60)
GFR, Estimated: >60 mL/min (ref >60)
Glucose, Bld: 186 mg/dL — ABNORMAL HIGH (ref 70–99)
Glucose, Bld: 181 mg/dL — ABNORMAL HIGH (ref 70–99)
Glucose, Bld: 97 mg/dL (ref 70–99)
Glucose, Bld: 89 mg/dL (ref 70–99)
Potassium: 4.5 mmol/L (ref 3.5–5.1)
Potassium: 4.2 mmol/L (ref 3.5–5.1)
Potassium: 3.7 mmol/L (ref 3.5–5.1)
Potassium: 3.9 mmol/L (ref 3.5–5.1)
Sodium: 135 mmol/L (ref 135–145)
Sodium: 137 mmol/L (ref 135–145)
Sodium: 138 mmol/L (ref 135–145)
Sodium: 137 mmol/L (ref 135–145)

## 2022-02-09 LAB — CBC
HCT: 34.3 % — ABNORMAL LOW (ref 39.0–52.0)
Hemoglobin: 11.9 g/dL — ABNORMAL LOW (ref 13.0–17.0)
MCH: 33 pg (ref 26.0–34.0)
MCHC: 34.7 g/dL (ref 30.0–36.0)
MCV: 95 fL (ref 80.0–100.0)
Platelets: 146 10*3/uL — ABNORMAL LOW (ref 150–400)
RBC: 3.61 MIL/uL — ABNORMAL LOW (ref 4.22–5.81)
RDW: 11.2 % — ABNORMAL LOW (ref 11.5–15.5)
WBC: 6.3 10*3/uL (ref 4.0–10.5)
nRBC: 0 % (ref 0.0–0.2)

## 2022-02-09 LAB — CBG MONITORING, ED
Glucose-Capillary: 127 mg/dL — ABNORMAL HIGH (ref 70–99)
Glucose-Capillary: 154 mg/dL — ABNORMAL HIGH (ref 70–99)
Glucose-Capillary: 161 mg/dL — ABNORMAL HIGH (ref 70–99)
Glucose-Capillary: 180 mg/dL — ABNORMAL HIGH (ref 70–99)
Glucose-Capillary: 189 mg/dL — ABNORMAL HIGH (ref 70–99)
Glucose-Capillary: 235 mg/dL — ABNORMAL HIGH (ref 70–99)
Glucose-Capillary: 243 mg/dL — ABNORMAL HIGH (ref 70–99)
Glucose-Capillary: 82 mg/dL (ref 70–99)
Glucose-Capillary: 99 mg/dL (ref 70–99)

## 2022-02-09 LAB — GLUCOSE, CAPILLARY
Glucose-Capillary: 135 mg/dL — ABNORMAL HIGH (ref 70–99)
Glucose-Capillary: 323 mg/dL — ABNORMAL HIGH (ref 70–99)

## 2022-02-09 LAB — PHOSPHORUS: Phosphorus: 1.1 mg/dL — ABNORMAL LOW (ref 2.5–4.6)

## 2022-02-09 MED ORDER — LAMIVUDINE 150 MG PO TABS
300.0000 mg | ORAL_TABLET | Freq: Every day | ORAL | Status: DC
  Administered 2022-02-09: 300 mg via ORAL
  Filled 2022-02-09 (×2): qty 2

## 2022-02-09 MED ORDER — LIDOCAINE VISCOUS HCL 2 % MT SOLN
15.0000 mL | Freq: Once | OROMUCOSAL | Status: AC
  Administered 2022-02-09: 15 mL via ORAL
  Filled 2022-02-09: qty 15

## 2022-02-09 MED ORDER — LIDOCAINE VISCOUS HCL 2 % MT SOLN: 15.0000 mL | Freq: Once | OROMUCOSAL | Status: DC

## 2022-02-09 MED ORDER — DOLUTEGRAVIR-LAMIVUDINE 50-300 MG PO TABS
1.0000 | ORAL_TABLET | Freq: Every day | ORAL | Status: DC
  Filled 2022-02-09 (×2): qty 1

## 2022-02-09 MED ORDER — LISINOPRIL 5 MG PO TABS
5.0000 mg | ORAL_TABLET | Freq: Every day | ORAL | Status: DC
  Administered 2022-02-09 – 2022-02-13 (×5): 5 mg via ORAL
  Filled 2022-02-09 (×5): qty 1

## 2022-02-09 MED ORDER — DOLUTEGRAVIR SODIUM 50 MG PO TABS
50.0000 mg | ORAL_TABLET | Freq: Every day | ORAL | Status: DC
  Administered 2022-02-09: 50 mg via ORAL
  Filled 2022-02-09 (×2): qty 1

## 2022-02-09 MED ORDER — ALUM & MAG HYDROXIDE-SIMETH 200-200-20 MG/5ML PO SUSP: 30.0000 mL | Freq: Once | ORAL | Status: DC

## 2022-02-09 MED ORDER — PANTOPRAZOLE SODIUM 40 MG IV SOLR
40.0000 mg | Freq: Once | INTRAVENOUS | Status: AC
  Administered 2022-02-09: 40 mg via INTRAVENOUS
  Filled 2022-02-09: qty 10

## 2022-02-09 MED ORDER — INSULIN ASPART 100 UNIT/ML IJ SOLN
0.0000 [IU] | Freq: Three times a day (TID) | INTRAMUSCULAR | Status: DC
  Administered 2022-02-09: 3 [IU] via SUBCUTANEOUS
  Administered 2022-02-09: 1 [IU] via SUBCUTANEOUS
  Administered 2022-02-10: 7 [IU] via SUBCUTANEOUS
  Administered 2022-02-10: 3 [IU] via SUBCUTANEOUS
  Administered 2022-02-10: 2 [IU] via SUBCUTANEOUS
  Administered 2022-02-11: 9 [IU] via SUBCUTANEOUS
  Administered 2022-02-11: 3 [IU] via SUBCUTANEOUS
  Administered 2022-02-11: 9 [IU] via SUBCUTANEOUS

## 2022-02-09 MED ORDER — INSULIN GLARGINE-YFGN 100 UNIT/ML ~~LOC~~ SOLN
10.0000 [IU] | Freq: Every day | SUBCUTANEOUS | Status: DC
  Administered 2022-02-09: 10 [IU] via SUBCUTANEOUS
  Filled 2022-02-09 (×2): qty 0.1

## 2022-02-09 MED ORDER — ALUM & MAG HYDROXIDE-SIMETH 200-200-20 MG/5ML PO SUSP
30.0000 mL | Freq: Once | ORAL | Status: AC
  Administered 2022-02-09: 30 mL via ORAL
  Filled 2022-02-09: qty 30

## 2022-02-09 MED ORDER — SODIUM PHOSPHATES 45 MMOLE/15ML IV SOLN
15.0000 mmol | Freq: Four times a day (QID) | INTRAVENOUS | Status: DC
  Filled 2022-02-09 (×2): qty 5

## 2022-02-09 MED ORDER — SODIUM PHOSPHATES 45 MMOLE/15ML IV SOLN
45.0000 mmol | Freq: Once | INTRAVENOUS | Status: AC
  Administered 2022-02-09: 45 mmol via INTRAVENOUS
  Filled 2022-02-09: qty 15

## 2022-02-09 NOTE — Inpatient Diabetes Management (Signed)
Inpatient Diabetes Program Recommendations  AACE/ADA: New Consensus Statement on Inpatient Glycemic Control (2015)  Target Ranges:  Prepandial:   less than 140 mg/dL      Peak postprandial:   less than 180 mg/dL (1-2 hours)      Critically ill patients:  140 - 180 mg/dL   Lab Results  Component Value Date   GLUCAP 243 (H) 02/09/2022    Review of Glycemic Control  Diabetes history: DM 1 Outpatient Diabetes medications: Toujeo 18 units qhs, Humalog 10-12 units tid, Freestyle libre Current orders for Inpatient glycemic control:  Semglee 10 units Novolog 0-9 units tid  Spoke with pt at bedside regarding circumstance around admission. Pt reports feeling ill with N/V. His freestyle libre sensor ran out of its usage days and he never replaced it. He was without the sensor for 3 days. Pt reports not taking insulin during the time he was vomiting out of fear of hypoglycemia.  Spoke with pt about sick day guidelines and taking insulin as prescribed, checking glucose trends more frequently. Encouraged pt to discuss with PCP for future plan with sick day guidelines. Attached sick day guidelines education sheet to AVS for pt to reference at home.  Thanks,  Christena Deem RN, MSN, BC-ADM Inpatient Diabetes Coordinator Team Pager [phone] (8a-5p)

## 2022-02-09 NOTE — Progress Notes (Signed)
Notified on call for patient with elevated blood pressures and no prns or scheduled bp meds. Orders given. Will continue to monitor.

## 2022-02-09 NOTE — Progress Notes (Addendum)
Subjective:  The patient reports he is feeling improved from yesterday. He no longer endorses vomiting, chest pain, shortness of breath, or abdominal pain. His acid reflux has also improved after receiving protonix this morning. He has been able to tolerate water, and feels has appetite has returned.   He clarified today that he does have a backup glucose monitor, but it was in a separate area of the house at the time and he did not replace it after his freestyle libre died. He denied any recent illness, but reports he had a decreased appetite on Saturday and had not eaten that day. He reports all he had ingested on Saturday was 6 beers. On Sunday, he developed nausea and vomiting, and he took his normal long acting and sliding scale insulin that day. From Monday on, he was unable to keep down any food and he took less insulin, believing that his demand had decreased.   Objective:  Vital signs in last 24 hours: Vitals:   02/09/22 1100 02/09/22 1248 02/09/22 1430 02/09/22 1436  BP: (!) 172/93  (!) 172/97   Pulse: (!) 101  98   Resp: (!) 24  20   Temp:  98.3 F (36.8 C)  98.5 F (36.9 C)  TempSrc:  Oral  Oral  SpO2: 99%  100%   Weight:      Height:       Weight change:   Intake/Output Summary (Last 24 hours) at 02/09/2022 1453 Last data filed at 02/09/2022 0125 Gross per 24 hour  Intake 300 ml  Output --  Net 300 ml   General: Appears to be resting comfortably in bed; No acute distress HEENT: Normocephalic, atraumatic; Moist mucus membranes  Cardiovascular: Regular rate and rhythm; No murmurs, rubs, or gallops; 2+ radial and dp pulses bilaterally  Pulmonary: Normal respiratory effort; Lungs clear to auscultation bilaterally; No wheezes, rales or rhonchi  Abdomen: Mild epigastric tenderness to palpation, no rebound or guarding; soft, non-distended; no masses or organomegaly Extremities: No lower extremity edema   Assessment/Plan:  Principal Problem:   DKA (diabetic  ketoacidosis) (HCC)   Carl Reed is a 56 y.o. male with a past medical history of anemia, T1DM, neuropathy, HTN, GERD, and HIV presenting for 4 days of nausea and vomiting, admitted for medical management of DKA.   #Severe DKA, resolved #T1DM Upon further discussion today with patient, alcohol intoxication and poor po intake likely precipitated nausea and vomiting that caused patient not to take insulin consistently over the weekend. Today, he reports improved vomiting, abdominal pain, chest pain, and shortness of breath. His blood sugar has improved to 127, bicarb 24, and anion gap has closed x2. Plan to start Semglee and sliding scale. Will discontinue fluids once tolerating PO.  - Discontinue endotool  - Start Semglee 10 u daily and Novolog sliding scale with meals  - IV D5 LR 125 mL/hr until PO improves  - CBG q4 - BMP q4 hr x 3  #Hematemesis, resolved The patient denies any further episodes of hematemesis. His sore throat has improved with Protonix and viscous lidocaine. Continues with chest pain with eating. CXR this morning without pneumomediastinum. Has only had a few bites of food today. Will add Maalox to encourage po intake.  - Continue protonix 40 mg PO daily  - viscous lidocaine - Add Maalox  - Continue to monitor for return of symptoms  - If additional episode, stat CBC  #Hyperkalemia  Hyperkalemia has improved to 4.2. Will continue to monitor BMP and  have continuous cardiac monitoring for arrhythmias.  - Continuous cardiac monitoring  - BMP q4  #HIV  The patient confirmed his home HIV medication is Dovato daily. Plan to continue home meds. - Dolutegravir 50 mg PO daily  - Lamividine 300 mg PO daily    LOS: 1 day   [name], MD 02/09/2022, 2:53 PM   Attestation for Student Documentation:  I personally was present and performed or re-performed the history, physical exam and medical decision-making activities of this service and have verified that the  service and findings are accurately documented in the student's note.  [name], MD 02/09/2022, 2:53 PM

## 2022-02-09 NOTE — ED Notes (Signed)
ED TO INPATIENT HANDOFF REPORT    S Name/Age/Gender Carl Reed 56 y.o. male Room/Bed: 020C/020C  Code Status   Code Status: Full Code  Home/SNF/Other Home Patient oriented to: self, place, time, and situation Is this baseline? Yes   Triage Complete: Triage complete  Chief Complaint DKA (diabetic ketoacidosis) (HCC) [E11.10]  Triage Note Daughter/son stated, He has N/V with chest pain , SOB and Fatigue this started about 3 days ago. Took last night 5 units Humilog and 18 u of long lasting..   Allergies No Known Allergies  Level of Care/Admitting Diagnosis ED Disposition     ED Disposition  Admit   Condition  --   Comment  Hospital [facility]: [facility] [[id number]]  Level of Care: Progressive [102]  Admit to Progressive based on following criteria: GI, ENDOCRINE disease patients with GI bleeding, acute liver failure or pancreatitis, stable with diabetic ketoacidosis or thyrotoxicosis (hypothyroid) state.  Admit to Progressive based on following criteria: MULTISYSTEM THREATS such as stable sepsis, metabolic/electrolyte imbalance with or without encephalopathy that is responding to early treatment.  May admit patient to [facility] or [facility] if equivalent level of care is available:: No  Covid Evaluation: Asymptomatic - no recent exposure (last 10 days) testing not required  Diagnosis: DKA (diabetic ketoacidosis) [facility]) [[id number]]  Admitting Physician: Dickie La [[id number]]  Attending Physician: Dickie La [[id number]]  Certification:: I certify this patient will need inpatient services for at least 2 midnights  Estimated Length of Stay: 2          B Medical/Surgery History Past Medical History:  Diagnosis Date   Diabetes mellitus without complication (HCC)    History reviewed. No pertinent surgical history.   A IV Location/Drains/Wounds Patient Lines/Drains/Airways Status     Active Line/Drains/Airways     Name Placement date Placement time  Site Days   Peripheral IV 02/08/22 20 G Anterior;Distal;Right;Upper Arm 02/08/22  1140  Arm  1   Peripheral IV 02/08/22 20 G Anterior;Right Hand 02/08/22  1355  Hand  1            Intake/Output Last 24 hours  Intake/Output Summary (Last 24 hours) at 02/09/2022 1517 Last data filed at 02/09/2022 0125 Gross per 24 hour  Intake 300 ml  Output --  Net 300 ml    Labs/Imaging Results for orders placed or performed during the hospital encounter of 02/08/22 (from the past 48 hour(s))  Basic metabolic panel     Status: Abnormal   Collection Time: 02/08/22 11:06 AM  Result Value Ref Range   Sodium 140 135 - 145 mmol/L   Potassium 3.5 3.5 - 5.1 mmol/L   Chloride 112 (H) 98 - 111 mmol/L   CO2 15 (L) 22 - 32 mmol/L   Glucose, Bld 254 (H) 70 - 99 mg/dL    Comment: Glucose reference range applies only to samples taken after fasting for at least 8 hours.   BUN 32 (H) 6 - 20 mg/dL   Creatinine, Ser 2.95 (H) 0.61 - 1.24 mg/dL   Calcium 6.5 (L) 8.9 - 10.3 mg/dL   GFR, Estimated 48 (L) >60 mL/min    Comment: (NOTE) Calculated using the CKD-EPI Creatinine Equation (2021)    Anion gap 13 5 - 15    Comment: Performed at [facility], [address], [place], [place] [address]  Magnesium     Status: Abnormal   Collection Time: 02/08/22 11:06 AM  Result Value Ref Range   Magnesium 1.5 (L) 1.7 -  2.4 mg/dL    Comment: Performed at [facility], [address], [place], [place] [address]  CBG monitoring, ED     Status: Abnormal   Collection Time: 02/08/22 11:36 AM  Result Value Ref Range   Glucose-Capillary >600 (HH) 70 - 99 mg/dL    Comment: Glucose reference range applies only to samples taken after fasting for at least 8 hours.  Lactic acid, plasma     Status: Abnormal   Collection Time: 02/08/22 11:44 AM  Result Value Ref Range   Lactic Acid, Venous 3.6 (HH) 0.5 - 1.9 mmol/L    Comment: CRITICAL RESULT CALLED TO, READ BACK BY AND VERIFIED WITH PULLIAM,P RN @ 1259 02/08/22  LEONARD,A Performed at [facility], [address], [place], [place] [address]   Comprehensive metabolic panel     Status: Abnormal   Collection Time: 02/08/22 11:44 AM  Result Value Ref Range   Sodium 126 (L) 135 - 145 mmol/L   Potassium 7.5 (HH) 3.5 - 5.1 mmol/L    Comment: CRITICAL RESULT CALLED TO, READ BACK BY AND VERIFIED WITH PULLIAM,P RN @ 1259 02/08/22 LEONARD,A   Chloride 76 (L) 98 - 111 mmol/L   CO2 <7 (L) 22 - 32 mmol/L   Glucose, Bld 977 (HH) 70 - 99 mg/dL    Comment: CRITICAL RESULT CALLED TO, READ BACK BY AND VERIFIED WITH PULLIAM,P RN @ 1259 02/08/22 LEONARD,A Glucose reference range applies only to samples taken after fasting for at least 8 hours.    BUN 54 (H) 6 - 20 mg/dL   Creatinine, Ser 8.88 (H) 0.61 - 1.24 mg/dL   Calcium 9.5 8.9 - 28.0 mg/dL   Total Protein 8.0 6.5 - 8.1 g/dL   Albumin 4.5 3.5 - 5.0 g/dL   AST 31 15 - 41 U/L   ALT 33 0 - 44 U/L   Alkaline Phosphatase 123 38 - 126 U/L   Total Bilirubin 2.9 (H) 0.3 - 1.2 mg/dL   GFR, Estimated 20 (L) >60 mL/min    Comment: (NOTE) Calculated using the CKD-EPI Creatinine Equation (2021)    Anion gap NOT CALCULATED 5 - 15    Comment: Performed at [facility], [address], [place], [place] [address]  CBC with Differential     Status: Abnormal   Collection Time: 02/08/22 11:44 AM  Result Value Ref Range   WBC 11.6 (H) 4.0 - 10.5 K/uL   RBC 4.13 (L) 4.22 - 5.81 MIL/uL   Hemoglobin 13.8 13.0 - 17.0 g/dL   HCT 79.1 50.5 - 69.7 %   MCV 105.1 (H) 80.0 - 100.0 fL   MCH 33.4 26.0 - 34.0 pg   MCHC 31.8 30.0 - 36.0 g/dL   RDW 94.8 01.6 - 55.3 %   Platelets 228 150 - 400 K/uL   nRBC 0.0 0.0 - 0.2 %   Neutrophils Relative % 94 %   Neutro Abs 10.9 (H) 1.7 - 7.7 K/uL   Lymphocytes Relative 3 %   Lymphs Abs 0.3 (L) 0.7 - 4.0 K/uL   Monocytes Relative 3 %   Monocytes Absolute 0.4 0.1 - 1.0 K/uL   Eosinophils Relative 0 %   Eosinophils Absolute 0.0 0.0 - 0.5 K/uL   Basophils Relative 0 %   Basophils  Absolute 0.0 0.0 - 0.1 K/uL   Immature Granulocytes 0 %   Abs Immature Granulocytes 0.03 0.00 - 0.07 K/uL    Comment: Performed at [facility], [address] [place], [place] [address]  Protime-INR     Status: None   Collection Time: 02/08/22 11:44 AM  Result Value Ref Range   Prothrombin Time 12.8 11.4 - 15.2 seconds   INR 1.0 0.8 - 1.2    Comment: (NOTE) INR goal varies based on device and disease states. Performed at [facility], [address] [place], [place] [address]   APTT     Status: None   Collection Time: 02/08/22 11:44 AM  Result Value Ref Range   aPTT 28 24 - 36 seconds    Comment: Performed at [facility], [address] [place], [place] [address]  Troponin I (High Sensitivity)     Status: None   Collection Time: 02/08/22 11:44 AM  Result Value Ref Range   Troponin I (High Sensitivity) 11 <18 ng/L    Comment: (NOTE) Elevated high sensitivity troponin I (hsTnI) values and significant  changes across serial measurements may suggest ACS but many other  chronic and acute conditions are known to elevate hsTnI results.  Refer to the Links section for chest pain algorithms and additional  guidance. Performed at [facility], [address] [place], [place] [address]   Beta-hydroxybutyric acid     Status: Abnormal   Collection Time: 02/08/22 11:44 AM  Result Value Ref Range   Beta-Hydroxybutyric Acid >8.00 (H) 0.05 - 0.27 mmol/L    Comment: RESULT CONFIRMED BY MANUAL DILUTION Performed at [facility], [address] [place], [place] [address]   Blood Culture (routine x 2)     Status: None (Preliminary result)   Collection Time: 02/08/22 11:49 AM   Specimen: BLOOD  Result Value Ref Range   Specimen Description BLOOD RIGHT ANTECUBITAL    Special Requests      BOTTLES DRAWN AEROBIC AND ANAEROBIC Blood Culture adequate volume   Culture      NO GROWTH < 24 HOURS Performed at [facility], [address] [place],  [place] [address]    Report Status PENDING   I-Stat venous blood gas, ED     Status: Abnormal   Collection Time: 02/08/22 12:13 PM  Result Value Ref Range   pH, Ven 7.126 (LL) 7.25 - 7.43   pCO2, Ven 18.4 (LL) 44 - 60 mmHg   pO2, Ven 63 (H) 32 - 45 mmHg   Bicarbonate 6.1 (L) 20.0 - 28.0 mmol/L   TCO2 7 (L) 22 - 32 mmol/L   O2 Saturation 85 %   Acid-base deficit 21.0 (H) 0.0 - 2.0 mmol/L   Sodium 116 (LL) 135 - 145 mmol/L   Potassium 6.9 (HH) 3.5 - 5.1 mmol/L   Calcium, Ion 1.02 (L) 1.15 - 1.40 mmol/L   HCT 48.0 39.0 - 52.0 %   Hemoglobin 16.3 13.0 - 17.0 g/dL   Sample type VENOUS    Comment NOTIFIED PHYSICIAN   I-Stat venous blood gas, [facility] ED only)     Status: Abnormal   Collection Time: 02/08/22 12:19 PM  Result Value Ref Range   pH, Ven 7.121 (LL) 7.25 - 7.43   pCO2, Ven 19.5 (LL) 44 - 60 mmHg   pO2, Ven 132 (H) 32 - 45 mmHg   Bicarbonate 6.3 (L) 20.0 - 28.0 mmol/L   TCO2 7 (L) 22 - 32 mmol/L   O2 Saturation 98 %   Acid-base deficit 21.0 (H) 0.0 - 2.0 mmol/L   Sodium 117 (LL) 135 - 145 mmol/L   Potassium 7.2 (HH) 3.5 -  5.1 mmol/L   Calcium, Ion 1.02 (L) 1.15 - 1.40 mmol/L   HCT 51.0 39.0 - 52.0 %   Hemoglobin 17.3 (H) 13.0 - 17.0 g/dL   Sample type VENOUS    Comment NOTIFIED PHYSICIAN   CBG monitoring, ED     Status: Abnormal   Collection Time: 02/08/22  1:19 PM  Result Value Ref Range   Glucose-Capillary >600 (HH) 70 - 99 mg/dL    Comment: Glucose reference range applies only to samples taken after fasting for at least 8 hours.  CBG monitoring, ED     Status: Abnormal   Collection Time: 02/08/22  1:34 PM  Result Value Ref Range   Glucose-Capillary >600 (HH) 70 - 99 mg/dL    Comment: Glucose reference range applies only to samples taken after fasting for at least 8 hours.  Lactic acid, plasma     Status: Abnormal   Collection Time: 02/08/22  1:56 PM  Result Value Ref Range   Lactic Acid, Venous 3.3 (HH) 0.5 - 1.9 mmol/L    Comment: CRITICAL VALUE NOTED. VALUE IS CONSISTENT  WITH PREVIOUSLY REPORTED/CALLED VALUE Performed at [facility], [address] [place], [place] [address]   Urinalysis, Routine w reflex microscopic Urine, Clean Catch     Status: Abnormal   Collection Time: 02/08/22  1:59 PM  Result Value Ref Range   Color, Urine STRAW (A) YELLOW   APPearance CLEAR CLEAR   Specific Gravity, Urine 1.017 1.005 - 1.030   pH 5.0 5.0 - 8.0   Glucose, UA >=500 (A) NEGATIVE mg/dL   Hgb urine dipstick SMALL (A) NEGATIVE   Bilirubin Urine NEGATIVE NEGATIVE   Ketones, ur 80 (A) NEGATIVE mg/dL   Protein, ur 30 (A) NEGATIVE mg/dL   Nitrite NEGATIVE NEGATIVE   Leukocytes,Ua NEGATIVE NEGATIVE   RBC / HPF 6-10 0 - 5 RBC/hpf   WBC, UA 0-5 0 - 5 WBC/hpf   Bacteria, UA RARE (A) NONE SEEN   Squamous Epithelial / LPF 0-5 0 - 5   Mucus PRESENT    Hyaline Casts, UA PRESENT     Comment: Performed at [facility], [address] [place], [place] [address]  Troponin I (High Sensitivity)     Status: None   Collection Time: 02/08/22  2:04 PM  Result Value Ref Range   Troponin I (High Sensitivity) 9 <18 ng/L    Comment: (NOTE) Elevated high sensitivity troponin I (hsTnI) values and significant  changes across serial measurements may suggest ACS but many other  chronic and acute conditions are known to elevate hsTnI results.  Refer to the "Links" section for chest pain algorithms and additional  guidance. Performed at [facility], [address] [place], [place] [address]   CBG monitoring, ED     Status: Abnormal   Collection Time: 02/08/22  2:30 PM  Result Value Ref Range   Glucose-Capillary >600 (HH) 70 - 99 mg/dL    Comment: Glucose reference range applies only to samples taken after fasting for at least 8 hours.  CBG monitoring, ED     Status: Abnormal   Collection Time: 02/08/22  3:41 PM  Result Value Ref Range   Glucose-Capillary >600 (HH) 70 - 99 mg/dL    Comment: Glucose reference range applies only to samples taken after fasting for  at least 8 hours.  Basic metabolic panel     Status: Abnormal   Collection Time: 02/08/22  3:59 PM  Result Value Ref Range  Sodium 137 135 - 145 mmol/L   Potassium 2.9 (L) 3.5 - 5.1 mmol/L   Chloride 106 98 - 111 mmol/L   CO2 8 (L) 22 - 32 mmol/L   Glucose, Bld 493 (H) 70 - 99 mg/dL    Comment: Glucose reference range applies only to samples taken after fasting for at least 8 hours.   BUN 41 (H) 6 - 20 mg/dL   Creatinine, Ser 9.60 (H) 0.61 - 1.24 mg/dL   Calcium 6.3 (LL) 8.9 - 10.3 mg/dL    Comment: CRITICAL RESULT CALLED TO, READ BACK BY AND VERIFIED WITH L BENNETT RN 02/08/2022 1753 B NUNNERY   GFR, Estimated 33 (L) >60 mL/min    Comment: (NOTE) Calculated using the CKD-EPI Creatinine Equation (2021)    Anion gap 23 (H) 5 - 15    Comment: Electrolytes repeated to confirm. Performed at [facility], [address], [place], [place] [address]   CBG monitoring, ED     Status: Abnormal   Collection Time: 02/08/22  4:22 PM  Result Value Ref Range   Glucose-Capillary 542 (HH) 70 - 99 mg/dL    Comment: Glucose reference range applies only to samples taken after fasting for at least 8 hours.  CBG monitoring, ED     Status: Abnormal   Collection Time: 02/08/22  5:07 PM  Result Value Ref Range   Glucose-Capillary 458 (H) 70 - 99 mg/dL    Comment: Glucose reference range applies only to samples taken after fasting for at least 8 hours.  CBG monitoring, ED     Status: Abnormal   Collection Time: 02/08/22  5:48 PM  Result Value Ref Range   Glucose-Capillary 474 (H) 70 - 99 mg/dL    Comment: Glucose reference range applies only to samples taken after fasting for at least 8 hours.  CBG monitoring, ED     Status: Abnormal   Collection Time: 02/08/22  6:23 PM  Result Value Ref Range   Glucose-Capillary 408 (H) 70 - 99 mg/dL    Comment: Glucose reference range applies only to samples taken after fasting for at least 8 hours.  CBG monitoring, ED     Status: Abnormal   Collection Time:  02/08/22  7:21 PM  Result Value Ref Range   Glucose-Capillary 369 (H) 70 - 99 mg/dL    Comment: Glucose reference range applies only to samples taken after fasting for at least 8 hours.  CBG monitoring, ED     Status: Abnormal   Collection Time: 02/08/22  8:21 PM  Result Value Ref Range   Glucose-Capillary 326 (H) 70 - 99 mg/dL    Comment: Glucose reference range applies only to samples taken after fasting for at least 8 hours.  CBG monitoring, ED     Status: Abnormal   Collection Time: 02/08/22  9:30 PM  Result Value Ref Range   Glucose-Capillary 280 (H) 70 - 99 mg/dL    Comment: Glucose reference range applies only to samples taken after fasting for at least 8 hours.  CBG monitoring, ED     Status: Abnormal   Collection Time: 02/08/22 10:30 PM  Result Value Ref Range   Glucose-Capillary 247 (H) 70 - 99 mg/dL    Comment: Glucose reference range applies only to samples taken after fasting for at least 8 hours.  Basic metabolic panel     Status: Abnormal   Collection Time: 02/08/22 10:33 PM  Result Value Ref Range   Sodium 139 135 - 145 mmol/L  Potassium 4.2 3.5 - 5.1 mmol/L   Chloride 106 98 - 111 mmol/L   CO2 19 (L) 22 - 32 mmol/L   Glucose, Bld 209 (H) 70 - 99 mg/dL    Comment: Glucose reference range applies only to samples taken after fasting for at least 8 hours.   BUN 35 (H) 6 - 20 mg/dL   Creatinine, Ser 9.37 (H) 0.61 - 1.24 mg/dL   Calcium 7.8 (L) 8.9 - 10.3 mg/dL   GFR, Estimated 50 (L) >60 mL/min    Comment: (NOTE) Calculated using the CKD-EPI Creatinine Equation (2021)    Anion gap 14 5 - 15    Comment: Performed at [facility], [address] [place], [place] [address]  Magnesium     Status: None   Collection Time: 02/08/22 10:33 PM  Result Value Ref Range   Magnesium 1.7 1.7 - 2.4 mg/dL    Comment: Performed at [facility], [address] [place], [place] [address]  CBG monitoring, ED     Status: Abnormal   Collection Time: 02/09/22 12:04 AM   Result Value Ref Range   Glucose-Capillary 189 (H) 70 - 99 mg/dL    Comment: Glucose reference range applies only to samples taken after fasting for at least 8 hours.  Basic metabolic panel     Status: Abnormal   Collection Time: 02/09/22  1:06 AM  Result Value Ref Range   Sodium 135 135 - 145 mmol/L   Potassium 4.5 3.5 - 5.1 mmol/L   Chloride 102 98 - 111 mmol/L   CO2 23 22 - 32 mmol/L   Glucose, Bld 186 (H) 70 - 99 mg/dL    Comment: Glucose reference range applies only to samples taken after fasting for at least 8 hours.   BUN 33 (H) 6 - 20 mg/dL   Creatinine, Ser 2.99 (H) 0.61 - 1.24 mg/dL   Calcium 9.2 8.9 - 24.2 mg/dL   GFR, Estimated 48 (L) >60 mL/min    Comment: (NOTE) Calculated using the CKD-EPI Creatinine Equation (2021)    Anion gap 10 5 - 15    Comment: Performed at [facility], [address], [place], [place] [address]  CBG monitoring, ED     Status: Abnormal   Collection Time: 02/09/22  1:53 AM  Result Value Ref Range   Glucose-Capillary 180 (H) 70 - 99 mg/dL    Comment: Glucose reference range applies only to samples taken after fasting for at least 8 hours.   Comment 1 Notify RN    Comment 2 Document in Chart   Basic metabolic panel     Status: Abnormal   Collection Time: 02/09/22  3:12 AM  Result Value Ref Range   Sodium 137 135 - 145 mmol/L   Potassium 4.2 3.5 - 5.1 mmol/L   Chloride 104 98 - 111 mmol/L   CO2 24 22 - 32 mmol/L   Glucose, Bld 181 (H) 70 - 99 mg/dL    Comment: Glucose reference range applies only to samples taken after fasting for at least 8 hours.   BUN 31 (H) 6 - 20 mg/dL   Creatinine, Ser 9.62 (H) 0.61 - 1.24 mg/dL   Calcium 9.1 8.9 - 22.9 mg/dL   GFR, Estimated 51 (L) >60 mL/min    Comment: (NOTE) Calculated using the CKD-EPI Creatinine Equation (2021)    Anion gap 9 5 - 15    Comment: Performed at [facility], [address], [place],  [place] [address]  CBG monitoring, ED     Status: Abnormal   Collection Time:  02/09/22  3:39 AM  Result Value Ref Range   Glucose-Capillary 161 (H) 70 - 99 mg/dL    Comment: Glucose reference range applies only to samples taken after fasting for at least 8 hours.   Comment 1 Notify RN    Comment 2 Document in Chart   CBG monitoring, ED     Status: Abnormal   Collection Time: 02/09/22  5:28 AM  Result Value Ref Range   Glucose-Capillary 154 (H) 70 - 99 mg/dL    Comment: Glucose reference range applies only to samples taken after fasting for at least 8 hours.  CBG monitoring, ED     Status: Abnormal   Collection Time: 02/09/22  8:17 AM  Result Value Ref Range   Glucose-Capillary 127 (H) 70 - 99 mg/dL    Comment: Glucose reference range applies only to samples taken after fasting for at least 8 hours.   Comment 1 Notify RN    Comment 2 Document in Chart   CBG monitoring, ED     Status: Abnormal   Collection Time: 02/09/22 11:05 AM  Result Value Ref Range   Glucose-Capillary 235 (H) 70 - 99 mg/dL    Comment: Glucose reference range applies only to samples taken after fasting for at least 8 hours.   Comment 1 Notify RN    Comment 2 Document in Chart   CBG monitoring, ED     Status: Abnormal   Collection Time: 02/09/22 12:27 PM  Result Value Ref Range   Glucose-Capillary 243 (H) 70 - 99 mg/dL    Comment: Glucose reference range applies only to samples taken after fasting for at least 8 hours.  CBC     Status: Abnormal   Collection Time: 02/09/22  2:25 PM  Result Value Ref Range   WBC 6.3 4.0 - 10.5 K/uL   RBC 3.61 (L) 4.22 - 5.81 MIL/uL   Hemoglobin 11.9 (L) 13.0 - 17.0 g/dL   HCT 95.6 (L) 21.3 - 08.6 %   MCV 95.0 80.0 - 100.0 fL   MCH 33.0 26.0 - 34.0 pg   MCHC 34.7 30.0 - 36.0 g/dL   RDW 57.8 (L) 46.9 - 62.9 %   Platelets 146 (L) 150 - 400 K/uL   nRBC 0.0 0.0 - 0.2 %    Comment: Performed at [facility], [address] [place], [place] [address]  CBG monitoring, ED     Status: None   Collection Time: 02/09/22  2:37 PM  Result Value Ref Range    Glucose-Capillary 99 70 - 99 mg/dL    Comment: Glucose reference range applies only to samples taken after fasting for at least 8 hours.   DG Chest Port 1 View  Result Date: 02/08/2022 CLINICAL DATA:  Sepsis EXAM: PORTABLE CHEST 1 VIEW COMPARISON:  None Available. FINDINGS: No pleural effusion. No pneumothorax. No focal airspace opacity. Unchanged cardiac and mediastinal contours. No displaced rib fractures. Visualized upper abdomen is unremarkable. IMPRESSION: No focal airspace opacity. Electronically Signed   By: Lorenza Cambridge M.D.   On: 02/08/2022 13:01    Pending Labs Unresulted Labs (From admission, onward)     Start     Ordered   02/10/22 0500  Basic metabolic panel  Daily at 5am,   R      02/09/22 0629   02/10/22 0500  CBC  Tomorrow morning,   R  02/09/22 1455   02/09/22 0852  Basic metabolic panel  Now then every 4 hours,   R      02/09/22 0852   02/08/22 1144  Blood Culture (routine x 2)  (Undifferentiated presentation (screening labs and basic nursing orders))  BLOOD CULTURE X 2,   STAT      02/08/22 1143            Vitals/Pain Today's Vitals   02/09/22 1100 02/09/22 1248 02/09/22 1430 02/09/22 1436  BP: (!) 172/93  (!) 172/97   Pulse: (!) 101  98   Resp: (!) 24  20   Temp:  98.3 F (36.8 C)  98.5 F (36.9 C)  TempSrc:  Oral  Oral  SpO2: 99%  100%   Weight:      Height:      PainSc:        Isolation Precautions No active isolations  Medications Medications  lactated ringers infusion (0 mLs Intravenous Stopped 02/08/22 2234)  dextrose 5 % in lactated ringers infusion ( Intravenous New Bag/Given 02/09/22 0804)  dextrose 50 % solution 0-50 mL (has no administration in time range)  heparin injection 5,000 Units (5,000 Units Subcutaneous Given 02/09/22 1431)  pantoprazole (PROTONIX) EC tablet 40 mg (40 mg Oral Given 02/09/22 1050)  insulin glargine-yfgn (SEMGLEE) injection 10 Units (10 Units Subcutaneous Given 02/09/22 1107)  insulin aspart (novoLOG)  injection 0-9 Units (3 Units Subcutaneous Given 02/09/22 1239)  dolutegravir (TIVICAY) tablet 50 mg (50 mg Oral Given 02/09/22 1240)    And  lamiVUDine (EPIVIR) tablet 300 mg (300 mg Oral Given 02/09/22 1240)  alum & mag hydroxide-simeth (MAALOX/MYLANTA) 200-200-20 MG/5ML suspension 30 mL (has no administration in time range)    And  lidocaine (XYLOCAINE) 2 % viscous mouth solution 15 mL (has no administration in time range)  sodium chloride 0.9 % bolus 1,000 mL (0 mLs Intravenous Stopped 02/08/22 1340)  lactated ringers bolus 1,270 mL (0 mLs Intravenous Stopped 02/08/22 1524)  albuterol (PROVENTIL) (2.5 MG/3ML) 0.083% nebulizer solution 10 mg (10 mg Nebulization Given 02/08/22 1406)  calcium gluconate 1 g/ 50 mL sodium chloride IVPB (0 mg Intravenous Stopped 02/08/22 1426)  potassium chloride 10 mEq in 100 mL IVPB (0 mEq Intravenous Stopped 02/09/22 0151)  pantoprazole (PROTONIX) injection 40 mg (40 mg Intravenous Given 02/09/22 0758)  alum & mag hydroxide-simeth (MAALOX/MYLANTA) 200-200-20 MG/5ML suspension 30 mL (30 mLs Oral Given 02/09/22 0748)    And  lidocaine (XYLOCAINE) 2 % viscous mouth solution 15 mL (15 mLs Oral Given 02/09/22 0749)    Mobility walks with device High fall risk   Focused Assessments Pulmonary Assessment Handoff:  Lung sounds:   O2 Device: Room Air      R Recommendations: See Admitting Provider Note  Report given to:

## 2022-02-09 NOTE — Plan of Care (Signed)

## 2022-02-10 LAB — CBC
HCT: 32.9 % — ABNORMAL LOW (ref 39.0–52.0)
Hemoglobin: 11.8 g/dL — ABNORMAL LOW (ref 13.0–17.0)
MCH: 33.5 pg (ref 26.0–34.0)
MCHC: 35.9 g/dL (ref 30.0–36.0)
MCV: 93.5 fL (ref 80.0–100.0)
Platelets: 131 K/uL — ABNORMAL LOW (ref 150–400)
RBC: 3.52 MIL/uL — ABNORMAL LOW (ref 4.22–5.81)
RDW: 11.4 % — ABNORMAL LOW (ref 11.5–15.5)
WBC: 5.8 K/uL (ref 4.0–10.5)
nRBC: 0 % (ref 0.0–0.2)

## 2022-02-10 LAB — BASIC METABOLIC PANEL WITH GFR
Anion gap: 10 (ref 5–15)
BUN: 15 mg/dL (ref 6–20)
CO2: 26 mmol/L (ref 22–32)
Calcium: 8.8 mg/dL — ABNORMAL LOW (ref 8.9–10.3)
Chloride: 96 mmol/L — ABNORMAL LOW (ref 98–111)
Creatinine, Ser: 1.07 mg/dL (ref 0.61–1.24)
GFR, Estimated: >60 mL/min
Glucose, Bld: 276 mg/dL — ABNORMAL HIGH (ref 70–99)
Potassium: 3.8 mmol/L (ref 3.5–5.1)
Sodium: 132 mmol/L — ABNORMAL LOW (ref 135–145)

## 2022-02-10 LAB — GLUCOSE, CAPILLARY
Glucose-Capillary: 308 mg/dL — ABNORMAL HIGH (ref 70–99)
Glucose-Capillary: 223 mg/dL — ABNORMAL HIGH (ref 70–99)
Glucose-Capillary: 185 mg/dL — ABNORMAL HIGH (ref 70–99)
Glucose-Capillary: 272 mg/dL — ABNORMAL HIGH (ref 70–99)

## 2022-02-10 LAB — PHOSPHORUS: Phosphorus: 3.2 mg/dL (ref 2.5–4.6)

## 2022-02-10 MED ORDER — INSULIN GLARGINE-YFGN 100 UNIT/ML ~~LOC~~ SOLN
12.0000 [IU] | Freq: Every day | SUBCUTANEOUS | Status: DC
  Administered 2022-02-10: 12 [IU] via SUBCUTANEOUS
  Filled 2022-02-10 (×2): qty 0.12

## 2022-02-10 MED ORDER — ENOXAPARIN SODIUM 40 MG/0.4ML IJ SOSY
40.0000 mg | PREFILLED_SYRINGE | INTRAMUSCULAR | Status: DC
  Administered 2022-02-10 – 2022-02-12 (×3): 40 mg via SUBCUTANEOUS
  Filled 2022-02-10 (×3): qty 0.4

## 2022-02-10 MED ORDER — DOLUTEGRAVIR SODIUM 50 MG PO TABS
50.0000 mg | ORAL_TABLET | Freq: Every day | ORAL | Status: DC
  Administered 2022-02-10: 50 mg via ORAL
  Filled 2022-02-10 (×2): qty 1

## 2022-02-10 MED ORDER — FAMOTIDINE 20 MG PO TABS
20.0000 mg | ORAL_TABLET | Freq: Every day | ORAL | Status: DC
  Administered 2022-02-10 – 2022-02-13 (×4): 20 mg via ORAL
  Filled 2022-02-10 (×4): qty 1

## 2022-02-10 MED ORDER — LAMIVUDINE 150 MG PO TABS
300.0000 mg | ORAL_TABLET | Freq: Every day | ORAL | Status: DC
  Administered 2022-02-10: 300 mg via ORAL
  Filled 2022-02-10 (×2): qty 2

## 2022-02-10 MED ORDER — PANTOPRAZOLE SODIUM 40 MG PO TBEC
40.0000 mg | DELAYED_RELEASE_TABLET | Freq: Two times a day (BID) | ORAL | Status: DC
  Administered 2022-02-10 – 2022-02-13 (×7): 40 mg via ORAL
  Filled 2022-02-10 (×7): qty 1

## 2022-02-10 MED ORDER — ACETAMINOPHEN 325 MG PO TABS
650.0000 mg | ORAL_TABLET | Freq: Four times a day (QID) | ORAL | Status: DC | PRN
  Administered 2022-02-10 – 2022-02-12 (×3): 650 mg via ORAL
  Filled 2022-02-10 (×3): qty 2

## 2022-02-10 MED ORDER — LIDOCAINE VISCOUS HCL 2 % MT SOLN
15.0000 mL | Freq: Once | OROMUCOSAL | Status: AC
  Administered 2022-02-10: 15 mL via ORAL
  Filled 2022-02-10: qty 15

## 2022-02-10 MED ORDER — ALUM & MAG HYDROXIDE-SIMETH 200-200-20 MG/5ML PO SUSP
30.0000 mL | Freq: Once | ORAL | Status: AC
  Administered 2022-02-10: 30 mL via ORAL
  Filled 2022-02-10 (×2): qty 30

## 2022-02-10 NOTE — Progress Notes (Signed)
Paged by RN patient endorsing pain with swallowing all day. Mr. Caterino notes that this pain has been occurring since he had multiple bouts of vomiting prior to admission. It is a pressure pain that occurs when he eats or drinks only. It is relieved a bit after this. Denies this occurring in the past, but recall having dysphagia in the past that required esophageal dilation 2 years ago. Denies shortness of breath or persistent chest pain.   Likely irritation of the esophagus secondary to his persistent vomiting, did have some hematemesis on presentation. No further emesis or episodes of hematemesis. Per chart review was seen by GI and had an endoscopy where candida esophagitis was found and treated. On chart review no evidence of esophageal dilation.   Will treat with maalox and viscous lidocaine solution and monitor. Because he has had persistent dysphagia prior to admission, will need to follow up with GI for possible repeat endoscopy.

## 2022-02-10 NOTE — Inpatient Diabetes Management (Signed)
Inpatient Diabetes Program Recommendations  AACE/ADA: New Consensus Statement on Inpatient Glycemic Control (2015)  Target Ranges:  Prepandial:   less than 140 mg/dL      Peak postprandial:   less than 180 mg/dL (1-2 hours)      Critically ill patients:  140 - 180 mg/dL   Lab Results  Component Value Date   GLUCAP 308 (H) 02/10/2022    Review of Glycemic Control  Diabetes history: DM 1 Outpatient Diabetes medications: Toujeo 18 units qhs, Humalog 10-12 units tid, Freestyle libre Current orders for Inpatient glycemic control:  Semglee 12 units Novolog 0-9 units tid  To determine insulin needs please order insulin closer to home dose. Glucose trends still in the 300 range. Renal function now WNL.  -    Increase Semglee to 18 units -    Add Novolog 3 units tid meal coverage if eating >50% of meals  Thanks,  Christena Deem RN, MSN, BC-ADM Inpatient Diabetes Coordinator Team Pager [phone] (8a-5p)

## 2022-02-10 NOTE — Progress Notes (Addendum)
    Subjective:  Feels better than last night. The pain he is having is more like a pressure. Happens when drinking water, after eating, even off just saliva. Not really able to eat much--half a protein shake. Softs/liquids better than solids. A week or two ago he didn't have pain with eating but did have globus sensation in chest. Pain started after started vomiting so much. Globus sensation has gotten worse lately. Pressure sensation at home too but was not previously persistent. Maalox did help a little yesterday.  Objective:  Vital signs in last 24 hours: Vitals:   02/10/22 0007 02/10/22 0433 02/10/22 0836 02/10/22 1105  BP: (!) 158/97 (!) 158/98 (!) 156/100 (!) 148/93  Pulse: 95 98 (!) 105 88  Resp: 16 13 18 14   Temp: 98.4 F (36.9 C) 98.3 F (36.8 C) 99.1 F (37.3 C) 98.3 F (36.8 C)  TempSrc: Oral Oral Oral Oral  SpO2: 99% 99% 100% 99%  Weight:      Height:       Weight change: -1.814 kg  Intake/Output Summary (Last 24 hours) at 02/10/2022 1400 Last data filed at 02/10/2022 0434 Gross per 24 hour  Intake 740 ml  Output 1650 ml  Net -910 ml   General: Appears to be resting comfortably in bed; No acute distress HEENT: Normocephalic, atraumatic; Moist mucus membranes  Cardiovascular: tachycardia, regular rhythm; No murmurs, rubs, or gallops Pulmonary: Normal respiratory effort; Lungs clear to auscultation bilaterally Abdomen: soft, nondistended, nontender Extremities: No lower extremity edema   Assessment/Plan:  Principal Problem:   DKA (diabetic ketoacidosis) (HCC)   Carl Reed is a 56 y.o. male with a past medical history of anemia, T1DM, neuropathy, HTN, GERD, and HIV presenting for 4 days of nausea and vomiting, admitted for medical management of DKA.   #Severe DKA, resolved #T1DM Upon further discussion, alcohol intoxication and poor po intake likely precipitated nausea and vomiting that caused patient not to take insulin consistently over the weekend.  Fasting glucose 308 today. Increase semglee and continue with sliding scale. Discontinued fluids.  - Discontinued endotool  - Semglee 12 u daily + SSI - ACHS glucose checks  #Hematemesis, resolved #Dyspepsia  Persistent dysphagia prior to admission and reports hx of endoscopy. Also, history of candidal esophagitis (in setting of HIV). Could also be ulcer. The patient denies any further episodes of hematemesis since admission. Continues with dysphagia and chest pain with solids and liquids. Feels as though he's better able to tolerate food today. Will give additional Maalox and encourage him to eat. - Protonix 40 mg PO bid  - viscous lidocaine - Maalox  - add pepcid - Continue to monitor for return of emesis  - If additional episode, stat CBC - follow up with GI outpatient for possible endoscopy  HTN Restarted home lisinopril 5 mg daily  #Hyperkalemia  K+ 3.8.  - Continuous cardiac monitoring  - daily BMP  #HIV  The patient confirmed his home HIV medication is Dovato daily. Plan to continue home meds. - Dolutegravir 50 mg PO daily  - Lamividine 300 mg PO daily    LOS: 2 days   59, MD 02/10/2022, 2:00 PM

## 2022-02-11 LAB — GLUCOSE, CAPILLARY
Glucose-Capillary: 395 mg/dL — ABNORMAL HIGH (ref 70–99)
Glucose-Capillary: 360 mg/dL — ABNORMAL HIGH (ref 70–99)
Glucose-Capillary: 204 mg/dL — ABNORMAL HIGH (ref 70–99)
Glucose-Capillary: 338 mg/dL — ABNORMAL HIGH (ref 70–99)

## 2022-02-11 LAB — CBC WITH DIFFERENTIAL/PLATELET
Abs Immature Granulocytes: 0.01 10*3/uL (ref 0.00–0.07)
Basophils Absolute: 0 10*3/uL (ref 0.0–0.1)
Basophils Relative: 0 %
Eosinophils Absolute: 0 10*3/uL (ref 0.0–0.5)
Eosinophils Relative: 0 %
HCT: 31.6 % — ABNORMAL LOW (ref 39.0–52.0)
Hemoglobin: 11.1 g/dL — ABNORMAL LOW (ref 13.0–17.0)
Immature Granulocytes: 0 %
Lymphocytes Relative: 21 %
Lymphs Abs: 1.1 10*3/uL (ref 0.7–4.0)
MCH: 33.6 pg (ref 26.0–34.0)
MCHC: 35.1 g/dL (ref 30.0–36.0)
MCV: 95.8 fL (ref 80.0–100.0)
Monocytes Absolute: 0.6 10*3/uL (ref 0.1–1.0)
Monocytes Relative: 11 %
Neutro Abs: 3.4 10*3/uL (ref 1.7–7.7)
Neutrophils Relative %: 68 %
Platelets: 128 10*3/uL — ABNORMAL LOW (ref 150–400)
RBC: 3.3 MIL/uL — ABNORMAL LOW (ref 4.22–5.81)
RDW: 11.3 % — ABNORMAL LOW (ref 11.5–15.5)
WBC: 5.1 10*3/uL (ref 4.0–10.5)
nRBC: 0 % (ref 0.0–0.2)

## 2022-02-11 LAB — CBC
HCT: 34.5 % — ABNORMAL LOW (ref 39.0–52.0)
Hemoglobin: 11.6 g/dL — ABNORMAL LOW (ref 13.0–17.0)
MCH: 32.9 pg (ref 26.0–34.0)
MCHC: 33.6 g/dL (ref 30.0–36.0)
MCV: 97.7 fL (ref 80.0–100.0)
Platelets: 117 10*3/uL — ABNORMAL LOW (ref 150–400)
RBC: 3.53 MIL/uL — ABNORMAL LOW (ref 4.22–5.81)
RDW: 11.5 % (ref 11.5–15.5)
WBC: 4.2 10*3/uL (ref 4.0–10.5)
nRBC: 0 % (ref 0.0–0.2)

## 2022-02-11 LAB — BASIC METABOLIC PANEL
Anion gap: 13 (ref 5–15)
BUN: 14 mg/dL (ref 6–20)
CO2: 23 mmol/L (ref 22–32)
Calcium: 8.5 mg/dL — ABNORMAL LOW (ref 8.9–10.3)
Chloride: 95 mmol/L — ABNORMAL LOW (ref 98–111)
Creatinine, Ser: 1.3 mg/dL — ABNORMAL HIGH (ref 0.61–1.24)
GFR, Estimated: >60 mL/min (ref >60)
Glucose, Bld: 361 mg/dL — ABNORMAL HIGH (ref 70–99)
Potassium: 3.5 mmol/L (ref 3.5–5.1)
Sodium: 131 mmol/L — ABNORMAL LOW (ref 135–145)

## 2022-02-11 MED ORDER — DOLUTEGRAVIR SODIUM 50 MG PO TABS
50.0000 mg | ORAL_TABLET | Freq: Every day | ORAL | Status: DC
  Administered 2022-02-11: 50 mg via ORAL
  Filled 2022-02-11: qty 1

## 2022-02-11 MED ORDER — NEPRO/CARBSTEADY PO LIQD
237.0000 mL | Freq: Three times a day (TID) | ORAL | Status: DC
  Administered 2022-02-11 – 2022-02-13 (×6): 237 mL via ORAL

## 2022-02-11 MED ORDER — INSULIN GLARGINE-YFGN 100 UNIT/ML ~~LOC~~ SOLN
18.0000 [IU] | Freq: Every day | SUBCUTANEOUS | Status: DC
  Administered 2022-02-11 – 2022-02-12 (×2): 18 [IU] via SUBCUTANEOUS
  Filled 2022-02-11 (×2): qty 0.18

## 2022-02-11 MED ORDER — LACTATED RINGERS IV BOLUS
1000.0000 mL | Freq: Once | INTRAVENOUS | Status: AC
  Administered 2022-02-11: 1000 mL via INTRAVENOUS

## 2022-02-11 MED ORDER — LAMIVUDINE 150 MG PO TABS
300.0000 mg | ORAL_TABLET | Freq: Every day | ORAL | Status: DC
  Filled 2022-02-11: qty 2

## 2022-02-11 MED ORDER — ADULT MULTIVITAMIN LIQUID CH
15.0000 mL | Freq: Every day | ORAL | Status: DC
  Administered 2022-02-12 – 2022-02-13 (×2): 15 mL via ORAL
  Filled 2022-02-11 (×2): qty 15

## 2022-02-11 MED ORDER — INSULIN GLARGINE-YFGN 100 UNIT/ML ~~LOC~~ SOLN: 15.0000 [IU] | Freq: Every day | SUBCUTANEOUS | Status: DC

## 2022-02-11 MED ORDER — SUCRALFATE 1 GM/10ML PO SUSP
1.0000 g | Freq: Three times a day (TID) | ORAL | Status: DC
  Administered 2022-02-11 – 2022-02-13 (×9): 1 g via ORAL
  Filled 2022-02-11 (×9): qty 10

## 2022-02-11 MED ORDER — DOLUTEGRAVIR SODIUM 50 MG PO TABS
50.0000 mg | ORAL_TABLET | Freq: Every day | ORAL | Status: DC
  Administered 2022-02-12 – 2022-02-13 (×2): 50 mg via ORAL
  Filled 2022-02-11 (×2): qty 1

## 2022-02-11 MED ORDER — LAMIVUDINE 150 MG PO TABS
300.0000 mg | ORAL_TABLET | Freq: Every day | ORAL | Status: DC
  Administered 2022-02-11 – 2022-02-13 (×3): 300 mg via ORAL
  Filled 2022-02-11 (×3): qty 2

## 2022-02-11 NOTE — Progress Notes (Addendum)
HD#3 SUBJECTIVE:  Patient Summary: Carl Reed is a 56 y.o. with a pertinent PMH of  type 1 diabetes complicated by neuropathy, hypertension, GERD, HIV who presented with several days of nausea and vomiting, admitted for severe DKA.  Overnight Events: Patient reports a sensation of "pouring alcohol on an open wound" when eating pineapples this morning.  Interim History: Carl Reed reports that aside from the pain described above, he is doing fair. More bland foods do not cause him significant pain. He has had dark, possibly black BM since his admission.   OBJECTIVE:  Vital Signs: Vitals:   02/10/22 2338 02/11/22 0355 02/11/22 0747 02/11/22 1241  BP: (!) 156/90 (!) 154/91 (!) 141/91 (!) 147/83  Pulse: 100 100 (!) 103 94  Resp: 19 20 18 18   Temp: 99.5 F (37.5 C) 98.7 F (37.1 C) 98.2 F (36.8 C) 98.3 F (36.8 C)  TempSrc: Oral Oral Oral Oral  SpO2: 100% 100% 100% 100%  Weight:      Height:       Supplemental O2: Room Air SpO2: 100 %  Filed Weights   02/08/22 1130 02/09/22 1600  Weight: 63.5 kg 61.7 kg     Intake/Output Summary (Last 24 hours) at 02/11/2022 1457 Last data filed at 02/11/2022 1241 Gross per 24 hour  Intake --  Output 200 ml  Net -200 ml   Net IO Since Admission: -570 mL [02/11/22 1457]  Physical Exam: Constitutional:Resting comfortably in bed. In no acute distress. Cardio:Regular rate and rhythm. No murmurs, rubs, or gallops. Pulm:Clear to auscultation bilaterally. Normal work of breathing on room air. Chest:No crepitus palpated. 13/11/23 for extremity edema. Skin:Warm and dry. Decreased skin turgor. Neuro:Alert and oriented x3. No focal deficit noted. Psych:Pleasant mood and affect.  Patient Lines/Drains/Airways Status     Active Line/Drains/Airways     Name Placement date Placement time Site Days   Peripheral IV 02/08/22 20 G Anterior;Right Hand 02/08/22  1355  Hand  3             ASSESSMENT/PLAN:  Assessment: Principal  Problem:   DKA (diabetic ketoacidosis) (HCC)   Plan: #Severe DKA, resolved #T1DM CBG remain elevated >300. He has been attempting to slowly increase his diet over the last several days. Given limited PO intake and clinical exam I am concerned for dehydration/hypovolemia as a continuing secondary problem of persistent hyperglycemia. -Increase semglee to 18 units, continue SSI -1L LR bolus -Monitor CBG -Consult RD per his request to review appropriate foods for DM and for epigastric pain (could be gastritis)   #Hematemesis, resolved #Dyspepsia  Dyspepsia persists, now reported to be significantly worse when consuming foods with higher acidity such as pineapples. He is tolerating more alkaline and soft food items now. He continues to describe the area of pain as centralized and focal over distal esophagus. No recurrent hematemesis, but possible melena concerning for mallory weiss tears with recurrent insult or slow bleeding versus severe GERD versus peptic ulcer with slow bleed. No concern for esophageal perforation. CBC stable at 11.6 (11.8>11.9). Hgb/HCT was 17.3/51 at time of admission but I suspect significant hemoconcentration given drastic change once his volume status was improved. I do not suspect an acute brisk bleed. -Start carafate 1 g suspension PO TID with meals and at bedtime -Protonix 40 mg PO BID, famotidine 20 mg PO daily -Viscous lidocaine, maalox  -STAT CBC if recurrent emesis and consider GI consult -Nursing to page team to assess BM for color and obtain FOBT when he has a  BM again -Will otherwise need follow-up with GI outpatient for possible endoscopy.  #Thrombocytopenia Downward trend in platelets over last several days, normal at admission at 228>146>131>117. PT/INR and aPTT normal at admission. Known HIV diagnosis. He is on lovenox for DVT prophylaxis; 4Ts score of 1 indicating low likelihood of HIT. May be transient or due to platelet clumping though I would not expect  to see gradual decrease over several days in those cases. -CBC w/ diff and smear review 11/12 AM -CMP 11/12 -Given persistent dyspepsia and severe GERD in setting of thrombocytopenia, can consider H. Pylori testing while hospitalized  #HTN Stable. -Continue home lisinopril 5 mg daily   #HIV  -Continue home dolutegravir 50 mg PO daily, lamividine 300 mg PO daily   Best Practice: Diet: Diabetic diet IVF: Fluids: LR, Rate:  1000 cc bolus VTE: enoxaparin (LOVENOX) injection 40 mg Start: 02/10/22 1600 Code: Full AB: None DISPO: Anticipated discharge to Home pending  medical stability .  Signature: Champ Mungo, D.O.  Internal Medicine Resident, PGY-2 [facility]  Pager: [phone]   Please contact the on call pager after 5 pm and on weekends at [phone].

## 2022-02-11 NOTE — Progress Notes (Signed)
Initial Nutrition Assessment  DOCUMENTATION CODES:   Underweight  INTERVENTION:   Nepro Shake po TD, each supplement provides 425 kcal and 19 grams protein  Liquid MVI daily via tube   Pt at high refeed risk; recommend monitor potassium, magnesium and phosphorus labs daily until stable  NUTRITION DIAGNOSIS:   Inadequate oral intake related to acute illness as evidenced by per patient/family report.  GOAL:   Patient will meet greater than or equal to 90% of their needs  MONITOR:   PO intake, Supplement acceptance, Labs, Weight trends, Skin, I & O's  REASON FOR ASSESSMENT:   Consult Assessment of nutrition requirement/status  ASSESSMENT:   56 y/o male with h/o IDDM, HIV and GERD who is admitted with DKA and painful swallowing.  RD working remotely.  Spoke with pt via phone. Pt reports nausea and vomiting that started 11/5. Pt reports that his nausea has resolved but now reports painful swallowing and food getting stuck around his chest area. Pt reports that up until Sunday, he was normal and not having any issues with eating or swallowing. MD suspects that pain may be related to mucosal tear/esophagitis. Pt reports that he mainly has issues with tough or acidic foods. Pt reports that he had oatmeal this morning that he did ok with and he has ordered soup to try for lunch. Pt has been drinking Ensure Max supplements cut with milk as he reports that this helps it go down better. Pt reports significant weight loss pta. Per chart, pt is down 9lbs(6%) since 10/17; RD unsure how much weight loss is r/t volume loss as pt has not been weighed since admission. RD discussed with pt the importance of adequate nutrition needed to preserve lean muscle. Discussed plan for now is to try and maximize nutrition with supplements and MVI. Diabetic friendly supplements were discussed with patient. Pt would like to try Nepro mixed with milk as this is high in calories and protein and is carb steady. RD  will add supplements to help pt meet his estimated needs. Pt is at high refeed risk. RD discussed NGT placement and nutrition support with pt in the event that he is unable to take in adequate oral intake. GI to evaluate pt's swallowing; pt may require EGD.   Pt is at high risk for malnutrition. RD will obtain nutrition related exam at follow-up.   Medications reviewed and include: lovenox, pepcid, insulin, protonix, carafate  Labs reviewed: Na 131(L), K 3.5 wnl, creat 1.30(H) P 3.2 wnl- 11/11 Cbgs- 360, 395 x 24 hrs   NUTRITION - FOCUSED PHYSICAL EXAM: Unable to perform at this time   Diet Order:   Diet Order             Diet Carb Modified Fluid consistency: Thin; Room service appropriate? Yes  Diet effective now                  EDUCATION NEEDS:   Education needs have been addressed  Skin:  Skin Assessment: Reviewed RN Assessment  Last BM:  11/10  Height:   Ht Readings from Last 1 Encounters:  02/09/22 5\' 11"  (1.803 m)    Weight:   Wt Readings from Last 1 Encounters:  02/09/22 61.7 kg    Ideal Body Weight:  78 kg  BMI:  Body mass index is 18.97 kg/m.  Estimated Nutritional Needs:   Kcal:  1900-2200kcal/day  Protein:  95-110g/day  Fluid:  1.9-2.2L/day  13/09/23 MS, RD, LDN Please refer to [facility] for RD  and/or RD on-call/weekend/after hours pager

## 2022-02-12 LAB — CBC WITH DIFFERENTIAL/PLATELET
Abs Immature Granulocytes: 0.01 10*3/uL (ref 0.00–0.07)
Basophils Absolute: 0 10*3/uL (ref 0.0–0.1)
Basophils Relative: 0 %
Eosinophils Absolute: 0 10*3/uL (ref 0.0–0.5)
Eosinophils Relative: 0 %
HCT: 31.6 % — ABNORMAL LOW (ref 39.0–52.0)
Hemoglobin: 10.9 g/dL — ABNORMAL LOW (ref 13.0–17.0)
Immature Granulocytes: 0 %
Lymphocytes Relative: 15 %
Lymphs Abs: 0.8 10*3/uL (ref 0.7–4.0)
MCH: 33.5 pg (ref 26.0–34.0)
MCHC: 34.5 g/dL (ref 30.0–36.0)
MCV: 97.2 fL (ref 80.0–100.0)
Monocytes Absolute: 0.5 10*3/uL (ref 0.1–1.0)
Monocytes Relative: 10 %
Neutro Abs: 4.1 10*3/uL (ref 1.7–7.7)
Neutrophils Relative %: 75 %
Platelets: 134 10*3/uL — ABNORMAL LOW (ref 150–400)
RBC: 3.25 MIL/uL — ABNORMAL LOW (ref 4.22–5.81)
RDW: 11.4 % — ABNORMAL LOW (ref 11.5–15.5)
WBC: 5.5 10*3/uL (ref 4.0–10.5)
nRBC: 0 % (ref 0.0–0.2)

## 2022-02-12 LAB — COMPREHENSIVE METABOLIC PANEL
ALT: 15 U/L (ref 0–44)
AST: 13 U/L — ABNORMAL LOW (ref 15–41)
Albumin: 3 g/dL — ABNORMAL LOW (ref 3.5–5.0)
Alkaline Phosphatase: 90 U/L (ref 38–126)
Anion gap: 8 (ref 5–15)
BUN: 19 mg/dL (ref 6–20)
CO2: 29 mmol/L (ref 22–32)
Calcium: 9.1 mg/dL (ref 8.9–10.3)
Chloride: 96 mmol/L — ABNORMAL LOW (ref 98–111)
Creatinine, Ser: 1.36 mg/dL — ABNORMAL HIGH (ref 0.61–1.24)
GFR, Estimated: >60 mL/min (ref >60)
Glucose, Bld: 369 mg/dL — ABNORMAL HIGH (ref 70–99)
Potassium: 3.6 mmol/L (ref 3.5–5.1)
Sodium: 133 mmol/L — ABNORMAL LOW (ref 135–145)
Total Bilirubin: 0.6 mg/dL (ref 0.3–1.2)
Total Protein: 5.6 g/dL — ABNORMAL LOW (ref 6.5–8.1)

## 2022-02-12 LAB — TECHNOLOGIST SMEAR REVIEW

## 2022-02-12 LAB — GLUCOSE, CAPILLARY
Glucose-Capillary: 130 mg/dL — ABNORMAL HIGH (ref 70–99)
Glucose-Capillary: 469 mg/dL — ABNORMAL HIGH (ref 70–99)
Glucose-Capillary: 585 mg/dL (ref 70–99)
Glucose-Capillary: 359 mg/dL — ABNORMAL HIGH (ref 70–99)
Glucose-Capillary: 69 mg/dL — ABNORMAL LOW (ref 70–99)

## 2022-02-12 LAB — HEMOGLOBIN A1C
Hgb A1c MFr Bld: 9 % — ABNORMAL HIGH (ref 4.8–5.6)
Mean Plasma Glucose: 211.6 mg/dL

## 2022-02-12 LAB — PHOSPHORUS: Phosphorus: 2.4 mg/dL — ABNORMAL LOW (ref 2.5–4.6)

## 2022-02-12 LAB — MAGNESIUM: Magnesium: 1.8 mg/dL (ref 1.7–2.4)

## 2022-02-12 MED ORDER — INSULIN ASPART 100 UNIT/ML IJ SOLN
0.0000 [IU] | Freq: Three times a day (TID) | INTRAMUSCULAR | Status: DC
  Administered 2022-02-12 (×2): 9 [IU] via SUBCUTANEOUS
  Administered 2022-02-13: 2 [IU] via SUBCUTANEOUS
  Administered 2022-02-13: 5 [IU] via SUBCUTANEOUS

## 2022-02-12 MED ORDER — INSULIN GLARGINE-YFGN 100 UNIT/ML ~~LOC~~ SOLN
18.0000 [IU] | Freq: Every day | SUBCUTANEOUS | Status: DC
  Filled 2022-02-12: qty 0.18

## 2022-02-12 MED ORDER — LACTATED RINGERS IV BOLUS
1000.0000 mL | Freq: Once | INTRAVENOUS | Status: AC
  Administered 2022-02-12: 1000 mL via INTRAVENOUS

## 2022-02-12 MED ORDER — INSULIN ASPART 100 UNIT/ML IJ SOLN
5.0000 [IU] | Freq: Three times a day (TID) | INTRAMUSCULAR | Status: DC
  Administered 2022-02-12 – 2022-02-13 (×3): 5 [IU] via SUBCUTANEOUS

## 2022-02-12 MED ORDER — INSULIN ASPART 100 UNIT/ML IJ SOLN: 0.0000 [IU] | Freq: Every day | INTRAMUSCULAR | Status: DC

## 2022-02-12 MED ORDER — INSULIN GLARGINE-YFGN 100 UNIT/ML ~~LOC~~ SOLN
18.0000 [IU] | Freq: Every day | SUBCUTANEOUS | Status: DC
  Administered 2022-02-13: 18 [IU] via SUBCUTANEOUS
  Filled 2022-02-12: qty 0.18

## 2022-02-12 NOTE — Progress Notes (Cosign Needed)
HD#4 SUBJECTIVE:  Patient Summary: Carl Reed is a 56 y.o. with a pertinent PMH of  type 1 diabetes complicated by neuropathy, hypertension, GERD, HIV who presented with several days of nausea and vomiting, admitted for severe DKA.  Overnight Events: NAEO.  Interim History: Still having some discomfort but it's slowly getting better. Shakes are going well for him. Internal dull pain with hot/warm foods in a localized area of lower sternum/epigastrium, but nothing like yesterday with the acidic foods. His pain is manageable. He really likes the pudding. No acute complaints during rounds. Discussed that endoscopy not warranted at this time.  OBJECTIVE:  Vital Signs: Vitals:   02/11/22 1941 02/11/22 2332 02/12/22 0350 02/12/22 0958  BP: 139/77 118/76 (!) 140/80 126/85  Pulse: 94 97 88 (!) 107  Resp: 15 14 19 20   Temp: 98.2 F (36.8 C) 98 F (36.7 C) 98.4 F (36.9 C) 98.5 F (36.9 C)  TempSrc: Oral Oral Oral Oral  SpO2: 100% 100% 100% 100%  Weight:      Height:       Supplemental O2: Room Air SpO2: 100 %  Filed Weights   02/08/22 1130 02/09/22 1600  Weight: 63.5 kg 61.7 kg     Intake/Output Summary (Last 24 hours) at 02/12/2022 1058 Last data filed at 02/11/2022 1800 Gross per 24 hour  Intake --  Output 450 ml  Net -450 ml   Net IO Since Admission: -820 mL [02/12/22 1058]  Physical Exam: Constitutional:Resting comfortably in bed. In no acute distress. Pulm: Clear to auscultation bilaterally. Normal work of breathing on room air. MSK: No edema Skin: Warm and dry.  Neuro: Alert and oriented x3. No focal deficit noted. Psych: Pleasant mood and affect.  Patient Lines/Drains/Airways Status     Active Line/Drains/Airways     Name Placement date Placement time Site Days   Peripheral IV 02/08/22 20 G Anterior;Right Hand 02/08/22  1355  Hand  3             ASSESSMENT/PLAN:  Assessment: Principal Problem:   DKA (diabetic ketoacidosis)  (HCC)   Plan: #Severe DKA, resolved #T1DM CBG remains elevated >300 overnight but 130 fasting today. He has been attempting to slowly increase his diet over the last several days. Given limited PO intake and clinical exam I am concerned for dehydration/hypovolemia as a continuing secondary problem of persistent hyperglycemia. Will add SSI qhs. May require additional mealtime insulin though will hold off until more data available. -Continue semglee 18 units, continue SSI and add SSI qhs -1L LR bolus again -Monitor CBG -Consult RD per his request to review appropriate foods for DM and for epigastric pain (could be gastritis)   #Hematemesis, resolved #Dyspepsia, improved  Dyspepsia persists though manageable today / yesterday evening. He is tolerating more alkaline and soft food items now. No recurrent hematemesis or melena. No concern for esophageal perforation. Do not suspect an acute brisk bleed. -Continue carafate 1 g suspension PO TID with meals and at bedtime -Protonix 40 mg PO BID, famotidine 20 mg PO daily -Viscous lidocaine, maalox  -STAT CBC if recurrent emesis and consider GI consult -Nursing to page team to assess BM for color and obtain FOBT when he has a BM again -Will otherwise need follow-up with GI outpatient for possible endoscopy.  #Thrombocytopenia Downward trend in platelets over last several days, normal at admission at 228>146>131>117. PT/INR and aPTT normal at admission. Known HIV diagnosis. He is on lovenox for DVT prophylaxis; 4Ts score of 1 indicating low likelihood  of HIT. May be transient or due to platelet clumping though I would not expect to see gradual decrease over several days in those cases. -f/u CBC -Given persistent dyspepsia and severe GERD in setting of thrombocytopenia, can consider H. Pylori testing while hospitalized  #HTN Stable. -Continue home lisinopril 5 mg daily   #HIV  -Continue home dolutegravir 50 mg PO daily, lamividine 300 mg PO daily    Best Practice: Diet: Diabetic diet IVF: Fluids: LR, Rate:  1000 cc bolus VTE: enoxaparin (LOVENOX) injection 40 mg Start: 02/10/22 1600 Code: Full AB: None DISPO: Anticipated discharge to Home pending  medical stability .  Signature: [name], MD  Internal Medicine Resident, PGY-1 [facility]  Pager: [phone]   Please contact the on call pager after 5 pm and on weekends at [phone].

## 2022-02-12 NOTE — Progress Notes (Signed)
CBG 469.  MD notified.  Pt is asymptomatic and vitals within normal range.  See new orders.

## 2022-02-13 LAB — CBC WITH DIFFERENTIAL/PLATELET
Abs Immature Granulocytes: 0.01 10*3/uL (ref 0.00–0.07)
Basophils Absolute: 0 10*3/uL (ref 0.0–0.1)
Basophils Relative: 0 %
Eosinophils Absolute: 0 10*3/uL (ref 0.0–0.5)
Eosinophils Relative: 1 %
HCT: 29.3 % — ABNORMAL LOW (ref 39.0–52.0)
Hemoglobin: 10.3 g/dL — ABNORMAL LOW (ref 13.0–17.0)
Immature Granulocytes: 0 %
Lymphocytes Relative: 29 %
Lymphs Abs: 1.4 10*3/uL (ref 0.7–4.0)
MCH: 34 pg (ref 26.0–34.0)
MCHC: 35.2 g/dL (ref 30.0–36.0)
MCV: 96.7 fL (ref 80.0–100.0)
Monocytes Absolute: 0.5 10*3/uL (ref 0.1–1.0)
Monocytes Relative: 11 %
Neutro Abs: 3 10*3/uL (ref 1.7–7.7)
Neutrophils Relative %: 59 %
Platelets: 153 10*3/uL (ref 150–400)
RBC: 3.03 MIL/uL — ABNORMAL LOW (ref 4.22–5.81)
RDW: 11.3 % — ABNORMAL LOW (ref 11.5–15.5)
WBC: 4.9 10*3/uL (ref 4.0–10.5)
nRBC: 0 % (ref 0.0–0.2)

## 2022-02-13 LAB — PHOSPHORUS: Phosphorus: 2.1 mg/dL — ABNORMAL LOW (ref 2.5–4.6)

## 2022-02-13 LAB — BASIC METABOLIC PANEL
Anion gap: 8 (ref 5–15)
BUN: 14 mg/dL (ref 6–20)
CO2: 29 mmol/L (ref 22–32)
Calcium: 8.8 mg/dL — ABNORMAL LOW (ref 8.9–10.3)
Chloride: 97 mmol/L — ABNORMAL LOW (ref 98–111)
Creatinine, Ser: 1.11 mg/dL (ref 0.61–1.24)
GFR, Estimated: >60 mL/min (ref >60)
Glucose, Bld: 346 mg/dL — ABNORMAL HIGH (ref 70–99)
Potassium: 3 mmol/L — ABNORMAL LOW (ref 3.5–5.1)
Sodium: 134 mmol/L — ABNORMAL LOW (ref 135–145)

## 2022-02-13 LAB — MAGNESIUM: Magnesium: 1.5 mg/dL — ABNORMAL LOW (ref 1.7–2.4)

## 2022-02-13 LAB — CULTURE, BLOOD (ROUTINE X 2)
Culture: NO GROWTH
Special Requests: ADEQUATE

## 2022-02-13 LAB — GLUCOSE, CAPILLARY
Glucose-Capillary: 312 mg/dL — ABNORMAL HIGH (ref 70–99)
Glucose-Capillary: 172 mg/dL — ABNORMAL HIGH (ref 70–99)

## 2022-02-13 MED ORDER — SUCRALFATE 1 GM/10ML PO SUSP: 1.0000 g | Freq: Three times a day (TID) | ORAL | 0 refills | Status: DC

## 2022-02-13 MED ORDER — PANTOPRAZOLE SODIUM 40 MG PO TBEC: 40.0000 mg | DELAYED_RELEASE_TABLET | Freq: Two times a day (BID) | ORAL | 0 refills | Status: DC

## 2022-02-13 MED ORDER — POTASSIUM CHLORIDE CRYS ER 20 MEQ PO TBCR
40.0000 meq | EXTENDED_RELEASE_TABLET | Freq: Once | ORAL | Status: AC
  Administered 2022-02-13: 40 meq via ORAL
  Filled 2022-02-13: qty 2

## 2022-02-13 MED ORDER — NEPRO/CARBSTEADY PO LIQD: 237.0000 mL | Freq: Three times a day (TID) | ORAL | 0 refills | Status: AC

## 2022-02-13 MED ORDER — MAGNESIUM OXIDE -MG SUPPLEMENT 400 (240 MG) MG PO TABS
800.0000 mg | ORAL_TABLET | Freq: Once | ORAL | Status: AC
  Administered 2022-02-13: 800 mg via ORAL
  Filled 2022-02-13: qty 2

## 2022-02-13 MED ORDER — FAMOTIDINE 20 MG PO TABS: 20.0000 mg | ORAL_TABLET | Freq: Every day | ORAL | 0 refills | Status: DC

## 2022-02-13 MED ORDER — DOVATO 50-300 MG PO TABS: 1.0000 | ORAL_TABLET | Freq: Every day | ORAL | 2 refills | Status: DC

## 2022-02-13 MED ORDER — MAGNESIUM SULFATE 2 GM/50ML IV SOLN
2.0000 g | Freq: Once | INTRAVENOUS | Status: DC
  Filled 2022-02-13: qty 50

## 2022-02-13 NOTE — Discharge Summary (Addendum)
Name: Carl Reed MRN: [id number] DOB: Jan 18, 1966 56 y.o. PCP: Patient, No Pcp Per  Date of Admission: 02/08/2022 11:06 AM Date of Discharge: 02/13/2022 Attending Physician: Dickie La, MD  Discharge Diagnosis: 1. Principal Problem:   DKA (diabetic ketoacidosis) (HCC)  Discharge Medications: Allergies as of 02/13/2022   No Known Allergies      Medication List     STOP taking these medications    omeprazole 40 MG capsule Commonly known as: PRILOSEC       TAKE these medications    Dovato 50-300 MG tablet Generic drug: dolutegravir-lamiVUDine Take 1 tablet by mouth daily. take Dovato 2 hours before multivitamin, sucralfate, and feeding supplement What changed: additional instructions   famotidine 20 MG tablet Commonly known as: PEPCID Take 1 tablet (20 mg total) by mouth daily. Start taking on: February 14, 2022   feeding supplement (NEPRO CARB STEADY) Liqd Take 237 mLs by mouth 3 (three) times daily between meals for 11 days.   gabapentin 300 MG capsule Commonly known as: NEURONTIN Take 600 mg by mouth 3 (three) times daily.   Gvoke HypoPen 2-Pack 1 MG/0.2ML Soaj Generic drug: Glucagon Inject 1 mL into the skin once as needed (for hypoglycemia).   insulin glargine (2 Unit Dial) 300 UNIT/ML Solostar Pen Commonly known as: TOUJEO MAX Inject 16 Units into the skin daily.   insulin lispro 100 UNIT/ML injection Commonly known as: HUMALOG Continue as previously prescribed by [facility] endocrinologist depending on the scale   lisinopril 5 MG tablet Commonly known as: ZESTRIL Take 5 mg by mouth daily.   meloxicam 15 MG tablet Commonly known as: MOBIC Take 15 mg by mouth daily.   metFORMIN 500 MG tablet Commonly known as: GLUCOPHAGE Take 500 mg by mouth 2 (two) times daily with a meal.   pantoprazole 40 MG tablet Commonly known as: PROTONIX Take 1 tablet (40 mg total) by mouth 2 (two) times daily for 14 days.   sucralfate 1 GM/10ML  suspension Commonly known as: CARAFATE Take 10 mLs (1 g total) by mouth 4 (four) times daily -  with meals and at bedtime.   tadalafil 20 MG tablet Commonly known as: CIALIS Take 20 mg by mouth daily as needed for erectile dysfunction.   traZODone 50 MG tablet Commonly known as: DESYREL Take 50 mg by mouth at bedtime as needed for sleep.        Disposition and follow-up:   Carl Reed was discharged from [facility] in Stable condition.  At the hospital follow up visit please address:  1.  T1DM, severe DKA: Patient's blood sugar remains labile at discharge. Plan for discharge on previous home regimen with close endocrinology follow-up. He would likely benefit from insulin pump. Please ensure follow up as well as continued absence of nausea, vomiting, and reduced appetite.  Hypokelamia, Hypomagnesemia: Mag 1.5, K 3.0 day of discharge. Received Mag 2g and K 40 meq. Please follow up with repeat labs.   Hematemesis, Dyspepsia: Patient has been without hematemesis during admission though continues with dyspepsia. Much better able to tolerate food however. Will discharge with carafate, protonix, famotidine. Patient instructed to follow up with his outpatient gastroenterologist. Please ensure this materializes. Also of note, patient should take his Dovato 2 hours before sucralfate, feeding supplement, and multivitamin to prevent reducing efficacy.  2.  Labs / imaging needed at time of follow-up: CBC, BMP, Magnesium  3.  Pending labs/ test needing follow-up: none  Follow-up Appointments:  Follow-up Information  Family Medicine , Primary Care. Schedule an appointment as soon as possible for a visit.   Why: Follow up with your PCP, Joyce Gross PA Contact information: [facility]  [address]  [address]  [place], [place] [address]  [phone]                Hospital Course by problem list: 1. PYP:PJKDTOI presented with history of  T1DM and AUD after having gone several days with little insulin due to poor appetite and drinking alcohol. Initial labs notable for bicarb 15, glucose 254, creatinine 1.68, calcium 6.5, anion gap 13, magnesium 1.5.  About 45 minutes later labs significantly worsened with potassium 7.5, chloride 76, CO2 undetectable, glucose 977, creatinine 3.49 and anion gap unable to be calculated.  Lactic acid 3.6.  CBC with mild leukocytosis 11.6.  Troponin negative.  Beta hydroxybutyrate greater than 8.  pH 7.12 with PCO2 18.  In the ED patient was treated with IV fluids and started on insulin infusion.  He was given calcium gluconate for severe hyperkalemia.  EKG showing sinus tach and evidence of peaked T waves.  UA positive for glucosuria and ketones, not consistent with infection.  HD 2 had significant improvement in electrolyte derangements, anion gap close and bicarb corrected. AKI also improved with fluids to near baseline. Transitioned off endotool and began subq insulin. Blood sugar remained labile with Semglee 18 + SSI. Eventually added hs correction as well as 5 u short acting with meals but continued with occasional postprandial and fasting sugars both low (69) and high (300s-500s), this likely complicated by continued inconsistency in po intake. Day of discharge, po intake much better with medication. Plans to resume home insulin regimen at discharge and follow up with endocrinology as soon as possible.  Hematemesis, dyspepsia: Reports of vomitus with dark brown components at presentation however did not have any more of this during admission. Tenderness to palpation at epigastrium. Also reported globus sensation. Has seen GI in past with reported dilation. Last RUQ [place] in 2021 without signs of liver disease. LFTs within normal limits, and his exam was negative for stigmata of liver disease, thus low suspicion for bleeding esophageal varices. Reported throat pain from profuse vomiting, suspicious for Mallory-Weiss  tears. Differential also included upper GI bleed, gastric ulcer. Tachycardic, but otherwise HDS. Symptoms were typically with po intake, liquids and solids. Worse with acidic foods and warm foods. Dyspepsia regimen escalated to include protonix 40 mg po twice daily, carafate, pepcid. Much better able to tolerate po intake by day of discharge, though still with room for improvement. Patient discharged with said regimen as well as instructions to followup with GI.  Discharge Exam:   BP 113/72 (BP Location: Right Arm)   Pulse 88   Temp 98.6 F (37 C) (Oral)   Resp 14   Ht 5\' 11"  (1.803 m)   Wt 61.7 kg   SpO2 100%   BMI 18.97 kg/m  Discharge exam:  Physical Exam Constitutional:      General: He is not in acute distress. HENT:     Head: Normocephalic and atraumatic.  Cardiovascular:     Rate and Rhythm: Normal rate and regular rhythm.  Pulmonary:     Effort: Pulmonary effort is normal.     Breath sounds: No wheezing, rhonchi or rales.  Abdominal:     Comments: Mild tenderness to palpation at epigastrium  Musculoskeletal:        General: No swelling.  Skin:    General:  Skin is warm and dry.  Neurological:     General: No focal deficit present.     Mental Status: He is alert and oriented to person, place, and time.  Psychiatric:        Mood and Affect: Mood normal.        Behavior: Behavior normal.      Pertinent Labs, Studies, and Procedures:  DG Chest Port 1 View  Result Date: 02/08/2022 CLINICAL DATA:  Sepsis EXAM: PORTABLE CHEST 1 VIEW COMPARISON:  None Available. FINDINGS: No pleural effusion. No pneumothorax. No focal airspace opacity. Unchanged cardiac and mediastinal contours. No displaced rib fractures. Visualized upper abdomen is unremarkable. IMPRESSION: No focal airspace opacity. Electronically Signed   By: Lorenza Cambridge M.D.   On: 02/08/2022 13:01     Discharge Instructions: Mr. Pauwels,  It was a pleasure caring for you during your admission here at [facility]  You came in with diabetic ketoacidosis. Your symptoms have improved and your appetite and nutrition are also improving. If you have a return of vomiting or dark, bloody stools, please seek medical attention. Please note the following items: -Schedule an appointment in the next week or so with your endocrinologist -Schedule an appointment in the next week or so with your gastroenterologist -Schedule an appointment in the next week or so with your primary car provider -resume your home insulin regimen -Be sure to take Dovato 2 hours before sucralfate, multivitamin, and feeding supplement as it may reduce the effect of this medication   Signed: [name], MD 02/13/2022, 11:42 AM   Pager: [phone]

## 2022-02-13 NOTE — Plan of Care (Signed)
  Problem: Education: Goal: Knowledge of General Education information will improve Description: Including pain rating scale, medication(s)/side effects and non-pharmacologic comfort measures Outcome: Adequate for Discharge   Problem: Health Behavior/Discharge Planning: Goal: Ability to manage health-related needs will improve Outcome: Adequate for Discharge   Problem: Clinical Measurements: Goal: Ability to maintain clinical measurements within normal limits will improve Outcome: Adequate for Discharge Goal: Will remain free from infection Outcome: Adequate for Discharge Goal: Diagnostic test results will improve Outcome: Adequate for Discharge Goal: Respiratory complications will improve Outcome: Adequate for Discharge Goal: Cardiovascular complication will be avoided Outcome: Adequate for Discharge   Problem: Activity: Goal: Risk for activity intolerance will decrease Outcome: Adequate for Discharge   Problem: Nutrition: Goal: Adequate nutrition will be maintained Outcome: Adequate for Discharge   Problem: Coping: Goal: Level of anxiety will decrease Outcome: Adequate for Discharge   Problem: Inadequate Intake (NI-2.1) Goal: Food and/or nutrient delivery Description: Individualized approach for food/nutrient provision. Outcome: Adequate for Discharge   Problem: Education: Goal: Ability to describe self-care measures that may prevent or decrease complications (Diabetes Survival Skills Education) will improve Outcome: Adequate for Discharge Goal: Individualized Educational Video(s) Outcome: Adequate for Discharge   Problem: Coping: Goal: Ability to adjust to condition or change in health will improve Outcome: Adequate for Discharge   Problem: Fluid Volume: Goal: Ability to maintain a balanced intake and output will improve Outcome: Adequate for Discharge   Problem: Health Behavior/Discharge Planning: Goal: Ability to identify and utilize available resources and  services will improve Outcome: Adequate for Discharge Goal: Ability to manage health-related needs will improve Outcome: Adequate for Discharge   Problem: Metabolic: Goal: Ability to maintain appropriate glucose levels will improve Outcome: Adequate for Discharge   Problem: Nutritional: Goal: Maintenance of adequate nutrition will improve Outcome: Adequate for Discharge Goal: Progress toward achieving an optimal weight will improve Outcome: Adequate for Discharge   Problem: Skin Integrity: Goal: Risk for impaired skin integrity will decrease Outcome: Adequate for Discharge   Problem: Tissue Perfusion: Goal: Adequacy of tissue perfusion will improve Outcome: Adequate for Discharge

## 2022-03-08 ENCOUNTER — Other Ambulatory Visit: Payer: Self-pay

## 2022-06-06 DIAGNOSIS — M87052 Idiopathic aseptic necrosis of left femur: Secondary | ICD-10-CM | POA: Insufficient documentation

## 2022-06-10 ENCOUNTER — Other Ambulatory Visit: Payer: Self-pay

## 2022-06-28 ENCOUNTER — Encounter (HOSPITAL_BASED_OUTPATIENT_CLINIC_OR_DEPARTMENT_OTHER): Payer: Self-pay | Admitting: Emergency Medicine

## 2022-06-28 ENCOUNTER — Emergency Department (HOSPITAL_BASED_OUTPATIENT_CLINIC_OR_DEPARTMENT_OTHER): Payer: BC Managed Care – PPO

## 2022-06-28 ENCOUNTER — Emergency Department (HOSPITAL_BASED_OUTPATIENT_CLINIC_OR_DEPARTMENT_OTHER)
Admission: EM | Admit: 2022-06-28 | Discharge: 2022-06-28 | Disposition: A | Discharge status: Home / Self Care | Payer: BC Managed Care – PPO | Attending: Emergency Medicine | Admitting: Emergency Medicine

## 2022-06-28 ENCOUNTER — Other Ambulatory Visit: Payer: Self-pay

## 2022-06-28 DIAGNOSIS — S92352A Displaced fracture of fifth metatarsal bone, left foot, initial encounter for closed fracture: Secondary | ICD-10-CM | POA: Insufficient documentation

## 2022-06-28 DIAGNOSIS — W109XXA Fall (on) (from) unspecified stairs and steps, initial encounter: Secondary | ICD-10-CM | POA: Diagnosis not present

## 2022-06-28 DIAGNOSIS — M79672 Pain in left foot: Secondary | ICD-10-CM | POA: Diagnosis present

## 2022-06-28 DIAGNOSIS — Z794 Long term (current) use of insulin: Secondary | ICD-10-CM | POA: Insufficient documentation

## 2022-06-28 DIAGNOSIS — S99192A Other physeal fracture of left metatarsal, initial encounter for closed fracture: Secondary | ICD-10-CM

## 2022-06-28 DIAGNOSIS — S90122A Contusion of left lesser toe(s) without damage to nail, initial encounter: Secondary | ICD-10-CM | POA: Diagnosis not present

## 2022-06-28 DIAGNOSIS — W19XXXA Unspecified fall, initial encounter: Secondary | ICD-10-CM

## 2022-06-28 DIAGNOSIS — M542 Cervicalgia: Secondary | ICD-10-CM | POA: Diagnosis not present

## 2022-06-28 NOTE — ED Triage Notes (Signed)
Pt fell down some steps 2 days ago; c/o LT foot pain; also c/o RT side neck pain

## 2022-06-28 NOTE — ED Provider Notes (Signed)
[facility] Provider Note   CSN: [id number] Arrival date & time: 06/28/22  1743     History  Chief Complaint  Patient presents with   Fall    Carl Reed is a 57 y.o. male presented after having a mechanical fall 4 days ago.  Patient dates he was going up the stairs when he missed a step fell and landed on his left foot.  Patient denies hitting his head or being on any blood thinners.  Patient states the past few days he has been icing his foot and taking Tylenol but still endorses left foot pain.  Patient states he can still feel and move his foot as normal.  Patient also endorsed chronic neck pain that is located in the back neck muscles on the right side.  His neck pain exacerbated with movement.  Patient denies any recent head trauma or neck trauma.  Patient has chest pain, shortness of breath, loss of consciousness, changes in sensation/motor skills, vision changes, headache, abdominal pain, weakness  Home Medications Prior to Admission medications   Medication Sig Start Date End Date Taking? Authorizing Provider  DOVATO 50-300 MG tablet TAKE 1 TABLET BY MOUTH ONCE DAILY. TAKE 2 HOURS BEFORE MULTIVITAMIN, SUCRALAFATE, AND FEEDING SUPPLEMENT 06/15/22   Linward Natal, MD  famotidine (PEPCID) 20 MG tablet Take 1 tablet by mouth once daily 03/15/22   Linward Natal, MD  gabapentin (NEURONTIN) 300 MG capsule Take 600 mg by mouth 3 (three) times daily. Patient not taking: Reported on 02/09/2022    [provider]  GVOKE HYPOPEN 2-PACK 1 MG/0.2ML SOAJ Inject 1 mL into the skin once as needed (for hypoglycemia). Patient not taking: Reported on 02/09/2022 01/17/22   [provider]  insulin glargine, 2 Unit Dial, (TOUJEO MAX) 300 UNIT/ML Solostar Pen Inject 16 Units into the skin daily. Patient not taking: Reported on 02/09/2022 03/28/20   Georgette Shell, MD  insulin lispro (HUMALOG) 100 UNIT/ML injection Continue as  previously prescribed by [facility] endocrinologist depending on the scale Patient not taking: Reported on 02/09/2022 03/28/20 02/09/23  Georgette Shell, MD  lisinopril (ZESTRIL) 5 MG tablet Take 5 mg by mouth daily. Patient not taking: Reported on 02/09/2022    [provider]  meloxicam (MOBIC) 15 MG tablet Take 15 mg by mouth daily. Patient not taking: Reported on 02/09/2022    [provider]  metFORMIN (GLUCOPHAGE) 500 MG tablet Take 500 mg by mouth 2 (two) times daily with a meal. Patient not taking: Reported on 02/09/2022 10/25/20   [provider]  pantoprazole (PROTONIX) 40 MG tablet Take 1 tablet (40 mg total) by mouth 2 (two) times daily for 14 days. 02/13/22 02/27/22  Linward Natal, MD  sucralfate (CARAFATE) 1 GM/10ML suspension Take 10 mLs (1 g total) by mouth 4 (four) times daily -  with meals and at bedtime. 02/13/22   Linward Natal, MD  tadalafil (CIALIS) 20 MG tablet Take 20 mg by mouth daily as needed for erectile dysfunction. Patient not taking: Reported on 02/09/2022 12/14/21   [provider]  traZODone (DESYREL) 50 MG tablet Take 50 mg by mouth at bedtime as needed for sleep. Patient not taking: Reported on 02/09/2022 11/25/20   [provider]      Allergies    Patient has no known allergies.    Review of Systems   Review of Systems Left foot pain See HPI Physical Exam Updated Vital Signs BP (!) 135/95 (BP Location:  Left Arm)   Pulse 95   Temp 98.5 F (36.9 C) (Oral)   Resp 17   Ht 5' 11.5" (1.816 m)   Wt 64.9 kg   SpO2 99%   BMI 19.67 kg/m  Physical Exam Constitutional:      General: He is not in acute distress. Eyes:     Extraocular Movements: Extraocular movements intact.     Conjunctiva/sclera: Conjunctivae normal.     Pupils: Pupils are equal, round, and reactive to light.  Cardiovascular:     Pulses: Normal pulses.     Comments: 2+ bilateral dorsalis pedis pulses with slightly increased  rate Musculoskeletal:        General: No deformity. Normal range of motion.     Cervical back: Normal range of motion and neck supple. No tenderness.     Comments: Tender to palpation at base of fifth metatarsal left foot No step-off/crepitus/abnormalities palpated on left foot or neck/skull Full active range of motion in neck and left foot Soft compartments No signs of open fracture  Skin:    General: Skin is warm and dry.     Capillary Refill: Capillary refill takes less than 2 seconds.     Comments: Bruising on left second through fourth metatarsal joints  Neurological:     General: No focal deficit present.     Mental Status: He is alert and oriented to person, place, and time.     Comments: Sensation intact distally     ED Results / Procedures / Treatments   Labs (all labs ordered are listed, but only abnormal results are displayed) Labs Reviewed - No data to display  EKG None  Radiology DG Cervical Spine 2-3 Views  Result Date: 06/28/2022 CLINICAL DATA:  Recent fall 2 days ago with neck pain, initial encounter EXAM: CERVICAL SPINE - 3 VIEW COMPARISON:  10/25/2020 FINDINGS: Seven cervical segments are well visualized. Vertebral body height is well maintained. Multilevel osteophytic change and facet hypertrophic changes seen. Mild degenerative anterolisthesis of C3 on C4 is noted new from the prior exam. No prevertebral soft tissue abnormality is seen. The odontoid is within normal limits. IMPRESSION: Multilevel degenerative change with anterolisthesis as described. Electronically Signed   By: Inez Catalina M.D.   On: 06/28/2022 19:13   DG Foot Complete Left  Result Date: 06/28/2022 CLINICAL DATA:  Left foot pain after fall down steps 2 days ago. EXAM: LEFT FOOT - COMPLETE 3+ VIEW COMPARISON:  None Available. FINDINGS: Transverse fracture of the base of the left fifth metatarsal bone with mild distraction of fracture fragments. No other acute fractures are identified. Hallux  valgus deformity with degenerative changes of the first metatarsal-phalangeal joint. No radiopaque soft tissue foreign bodies or soft tissue gas. IMPRESSION: 1. Transverse fracture of the base of the left fifth metatarsal bone. 2. 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 Electronically Signed   By: Lucienne Capers M.D.   On: 06/28/2022 19:13    Procedures Procedures    Medications Ordered in ED Medications - No data to display  ED Course/ Medical Decision Making/ A&P                             Medical Decision Making Amount and/or Complexity of Data Reviewed Radiology: ordered.   Carl Reed 57 y.o. presented today for left foot pain and neck pain. Working DDx that I considered at this time includes, but not limited to, vertebral arthritis, MSK strain, vertebral  fracture, Jones fracture, pseudo Jones fracture, Lisfranc fracture, ankle sprain.  R/o DDx: Pseudo Jones fracture, Lisfranc fracture, ankle sprain,: These are considered less likely due to history of present illness and physical exam findings  Review of prior external notes: 06/06/2022 office visit  Unique Tests and My Interpretation:  Cervical spine x-ray: Signs of arthritis, no acute changes Left foot x-ray: Transverse fracture of the proximal fifth metatarsal  Discussion with Independent Historian: None  Discussion of Management of Tests: None  Risk: Low:  - based on diagnostic testing/clinical impression and treatment plan   Risk Stratification Score: None  Plan: Patient presented for left foot pain and neck pain. On exam patient was in no acute distress and stable vitals.  Patient tenderness to palpation at the base of his left fifth metatarsal however no abnormalities were palpated.  This would correlate with patient's x-ray showing transverse fracture at the base of the fifth metatarsal concerning for Jones fracture.  Patient be placed in a boot with orthopedic follow-up and encouraged to  continue taking Tylenol every 6 hours as needed for pain and icing his foot.  As for the patient's neck neck x-ray was negative for any acute changes but did show arthritis.  On exam patient did not have tenderness and had full active range of motion in his neck.  Patient denied any red flag symptoms concerning his neck.  I spoke with the patient about continuing Tylenol as well for that.  Patient will be discharged with primary care follow-up.  Patient was given return precautions. Patient stable for discharge at this time.  Patient verbalized understanding of plan.         Final Clinical Impression(s) / ED Diagnoses Final diagnoses:  Closed fracture of base of fifth metatarsal bone of left foot at metaphyseal-diaphyseal junction, initial encounter  Fall, initial encounter    Rx / DC Orders ED Discharge Orders     None         Elvina Sidle 06/28/22 2023    Regan Lemming, MD 06/28/22 2121

## 2022-06-28 NOTE — Discharge Instructions (Signed)
Please call the orthopedist I have provided here for you in regards to your foot.  X-ray showed fracture in your fifth metatarsal.  You may take Tylenol every 6 hours as needed for pain.  Please continue to ice your foot and if symptoms worsen please return to ER.

## 2022-08-15 ENCOUNTER — Other Ambulatory Visit: Payer: Self-pay

## 2022-10-09 ENCOUNTER — Emergency Department (HOSPITAL_BASED_OUTPATIENT_CLINIC_OR_DEPARTMENT_OTHER): Payer: BC Managed Care – PPO

## 2022-10-09 ENCOUNTER — Other Ambulatory Visit: Payer: Self-pay

## 2022-10-09 ENCOUNTER — Encounter (HOSPITAL_BASED_OUTPATIENT_CLINIC_OR_DEPARTMENT_OTHER): Payer: Self-pay | Admitting: Emergency Medicine

## 2022-10-09 ENCOUNTER — Inpatient Hospital Stay (HOSPITAL_BASED_OUTPATIENT_CLINIC_OR_DEPARTMENT_OTHER)
Admission: EM | Admit: 2022-10-09 | Discharge: 2022-10-11 | DRG: 638 | Disposition: A | Discharge status: Home / Self Care | Payer: BC Managed Care – PPO | Attending: Internal Medicine | Admitting: Internal Medicine

## 2022-10-09 DIAGNOSIS — Z794 Long term (current) use of insulin: Secondary | ICD-10-CM | POA: Diagnosis not present

## 2022-10-09 DIAGNOSIS — I10 Essential (primary) hypertension: Secondary | ICD-10-CM | POA: Diagnosis present

## 2022-10-09 DIAGNOSIS — Z21 Asymptomatic human immunodeficiency virus [HIV] infection status: Secondary | ICD-10-CM | POA: Diagnosis present

## 2022-10-09 DIAGNOSIS — Y92009 Unspecified place in unspecified non-institutional (private) residence as the place of occurrence of the external cause: Secondary | ICD-10-CM

## 2022-10-09 DIAGNOSIS — B20 Human immunodeficiency virus [HIV] disease: Secondary | ICD-10-CM | POA: Diagnosis present

## 2022-10-09 DIAGNOSIS — Z91199 Patient's noncompliance with other medical treatment and regimen due to unspecified reason: Secondary | ICD-10-CM

## 2022-10-09 DIAGNOSIS — W19XXXA Unspecified fall, initial encounter: Secondary | ICD-10-CM | POA: Diagnosis present

## 2022-10-09 DIAGNOSIS — R112 Nausea with vomiting, unspecified: Secondary | ICD-10-CM | POA: Diagnosis present

## 2022-10-09 DIAGNOSIS — R7989 Other specified abnormal findings of blood chemistry: Secondary | ICD-10-CM | POA: Insufficient documentation

## 2022-10-09 DIAGNOSIS — N179 Acute kidney failure, unspecified: Secondary | ICD-10-CM | POA: Diagnosis present

## 2022-10-09 DIAGNOSIS — F17210 Nicotine dependence, cigarettes, uncomplicated: Secondary | ICD-10-CM | POA: Diagnosis present

## 2022-10-09 DIAGNOSIS — R7401 Elevation of levels of liver transaminase levels: Secondary | ICD-10-CM | POA: Diagnosis present

## 2022-10-09 DIAGNOSIS — Z79899 Other long term (current) drug therapy: Secondary | ICD-10-CM | POA: Diagnosis not present

## 2022-10-09 DIAGNOSIS — Z791 Long term (current) use of non-steroidal anti-inflammatories (NSAID): Secondary | ICD-10-CM

## 2022-10-09 DIAGNOSIS — E1010 Type 1 diabetes mellitus with ketoacidosis without coma: Principal | ICD-10-CM

## 2022-10-09 DIAGNOSIS — Z7984 Long term (current) use of oral hypoglycemic drugs: Secondary | ICD-10-CM

## 2022-10-09 DIAGNOSIS — K3184 Gastroparesis: Secondary | ICD-10-CM | POA: Diagnosis present

## 2022-10-09 DIAGNOSIS — E1043 Type 1 diabetes mellitus with diabetic autonomic (poly)neuropathy: Secondary | ICD-10-CM | POA: Diagnosis present

## 2022-10-09 DIAGNOSIS — E1110 Type 2 diabetes mellitus with ketoacidosis without coma: Secondary | ICD-10-CM | POA: Diagnosis present

## 2022-10-09 DIAGNOSIS — E860 Dehydration: Secondary | ICD-10-CM | POA: Diagnosis present

## 2022-10-09 HISTORY — DX: Essential (primary) hypertension: I10

## 2022-10-09 HISTORY — DX: Asymptomatic human immunodeficiency virus (hiv) infection status: Z21

## 2022-10-09 LAB — HEPATITIS PANEL, ACUTE
HCV Ab: NONREACTIVE
Hep A IgM: NONREACTIVE
Hep B C IgM: NONREACTIVE
Hepatitis B Surface Ag: NONREACTIVE

## 2022-10-09 LAB — URINALYSIS, MICROSCOPIC (REFLEX): Bacteria, UA: NONE SEEN

## 2022-10-09 LAB — GLUCOSE, CAPILLARY
Glucose-Capillary: 116 mg/dL — ABNORMAL HIGH (ref 70–99)
Glucose-Capillary: 122 mg/dL — ABNORMAL HIGH (ref 70–99)
Glucose-Capillary: 141 mg/dL — ABNORMAL HIGH (ref 70–99)
Glucose-Capillary: 151 mg/dL — ABNORMAL HIGH (ref 70–99)
Glucose-Capillary: 152 mg/dL — ABNORMAL HIGH (ref 70–99)
Glucose-Capillary: 178 mg/dL — ABNORMAL HIGH (ref 70–99)
Glucose-Capillary: 189 mg/dL — ABNORMAL HIGH (ref 70–99)
Glucose-Capillary: 203 mg/dL — ABNORMAL HIGH (ref 70–99)
Glucose-Capillary: 204 mg/dL — ABNORMAL HIGH (ref 70–99)
Glucose-Capillary: 256 mg/dL — ABNORMAL HIGH (ref 70–99)
Glucose-Capillary: 281 mg/dL — ABNORMAL HIGH (ref 70–99)
Glucose-Capillary: 324 mg/dL — ABNORMAL HIGH (ref 70–99)

## 2022-10-09 LAB — BASIC METABOLIC PANEL
Anion gap: 23 — ABNORMAL HIGH (ref 5–15)
Anion gap: 14 (ref 5–15)
Anion gap: 9 (ref 5–15)
BUN: 25 mg/dL — ABNORMAL HIGH (ref 6–20)
BUN: 22 mg/dL — ABNORMAL HIGH (ref 6–20)
BUN: 21 mg/dL — ABNORMAL HIGH (ref 6–20)
CO2: 10 mmol/L — ABNORMAL LOW (ref 22–32)
CO2: 18 mmol/L — ABNORMAL LOW (ref 22–32)
CO2: 21 mmol/L — ABNORMAL LOW (ref 22–32)
Calcium: 8.3 mg/dL — ABNORMAL LOW (ref 8.9–10.3)
Calcium: 8.2 mg/dL — ABNORMAL LOW (ref 8.9–10.3)
Calcium: 8.3 mg/dL — ABNORMAL LOW (ref 8.9–10.3)
Chloride: 100 mmol/L (ref 98–111)
Chloride: 101 mmol/L (ref 98–111)
Chloride: 105 mmol/L (ref 98–111)
Creatinine, Ser: 1.94 mg/dL — ABNORMAL HIGH (ref 0.61–1.24)
Creatinine, Ser: 1.41 mg/dL — ABNORMAL HIGH (ref 0.61–1.24)
Creatinine, Ser: 1.2 mg/dL (ref 0.61–1.24)
GFR, Estimated: 40 mL/min — ABNORMAL LOW (ref >60)
GFR, Estimated: 59 mL/min — ABNORMAL LOW (ref >60)
GFR, Estimated: >60 mL/min (ref >60)
Glucose, Bld: 343 mg/dL — ABNORMAL HIGH (ref 70–99)
Glucose, Bld: 209 mg/dL — ABNORMAL HIGH (ref 70–99)
Glucose, Bld: 138 mg/dL — ABNORMAL HIGH (ref 70–99)
Potassium: 5 mmol/L (ref 3.5–5.1)
Potassium: 4.5 mmol/L (ref 3.5–5.1)
Potassium: 4.1 mmol/L (ref 3.5–5.1)
Sodium: 133 mmol/L — ABNORMAL LOW (ref 135–145)
Sodium: 133 mmol/L — ABNORMAL LOW (ref 135–145)
Sodium: 135 mmol/L (ref 135–145)

## 2022-10-09 LAB — I-STAT VENOUS BLOOD GAS, ED
Acid-base deficit: 22 mmol/L — ABNORMAL HIGH (ref 0.0–2.0)
Bicarbonate: 5.2 mmol/L — ABNORMAL LOW (ref 20.0–28.0)
Calcium, Ion: 1.13 mmol/L — ABNORMAL LOW (ref 1.15–1.40)
HCT: 38 % — ABNORMAL LOW (ref 39.0–52.0)
Hemoglobin: 12.9 g/dL — ABNORMAL LOW (ref 13.0–17.0)
O2 Saturation: 79 %
Patient temperature: 98
Potassium: 5.4 mmol/L — ABNORMAL HIGH (ref 3.5–5.1)
Sodium: 126 mmol/L — ABNORMAL LOW (ref 135–145)
TCO2: 6 mmol/L — ABNORMAL LOW (ref 22–32)
pCO2, Ven: 16 mmHg — CL (ref 44–60)
pH, Ven: 7.121 — CL (ref 7.25–7.43)
pO2, Ven: 55 mmHg — ABNORMAL HIGH (ref 32–45)

## 2022-10-09 LAB — URINALYSIS, ROUTINE W REFLEX MICROSCOPIC
Glucose, UA: >=500 mg/dL — AB
Ketones, ur: >=80 mg/dL — AB
Leukocytes,Ua: NEGATIVE
Nitrite: NEGATIVE
Protein, ur: 100 mg/dL — AB
Specific Gravity, Urine: 1.02 (ref 1.005–1.030)
pH: 5.5 (ref 5.0–8.0)

## 2022-10-09 LAB — COMPREHENSIVE METABOLIC PANEL
ALT: 46 U/L — ABNORMAL HIGH (ref 0–44)
AST: 55 U/L — ABNORMAL HIGH (ref 15–41)
Albumin: 4.1 g/dL (ref 3.5–5.0)
Alkaline Phosphatase: 136 U/L — ABNORMAL HIGH (ref 38–126)
Anion gap: 33 — ABNORMAL HIGH (ref 5–15)
BUN: 25 mg/dL — ABNORMAL HIGH (ref 6–20)
CO2: 8 mmol/L — ABNORMAL LOW (ref 22–32)
Calcium: 9.1 mg/dL (ref 8.9–10.3)
Chloride: 89 mmol/L — ABNORMAL LOW (ref 98–111)
Creatinine, Ser: 1.92 mg/dL — ABNORMAL HIGH (ref 0.61–1.24)
GFR, Estimated: 41 mL/min — ABNORMAL LOW (ref >60)
Glucose, Bld: 526 mg/dL (ref 70–99)
Potassium: 5.9 mmol/L — ABNORMAL HIGH (ref 3.5–5.1)
Sodium: 130 mmol/L — ABNORMAL LOW (ref 135–145)
Total Bilirubin: 2.6 mg/dL — ABNORMAL HIGH (ref 0.3–1.2)
Total Protein: 7.6 g/dL (ref 6.5–8.1)

## 2022-10-09 LAB — CBG MONITORING, ED
Glucose-Capillary: 500 mg/dL — ABNORMAL HIGH (ref 70–99)
Glucose-Capillary: 494 mg/dL — ABNORMAL HIGH (ref 70–99)
Glucose-Capillary: 406 mg/dL — ABNORMAL HIGH (ref 70–99)

## 2022-10-09 LAB — CBC
HCT: 36 % — ABNORMAL LOW (ref 39.0–52.0)
Hemoglobin: 11.5 g/dL — ABNORMAL LOW (ref 13.0–17.0)
MCH: 33.4 pg (ref 26.0–34.0)
MCHC: 31.9 g/dL (ref 30.0–36.0)
MCV: 104.7 fL — ABNORMAL HIGH (ref 80.0–100.0)
Platelets: 234 10*3/uL (ref 150–400)
RBC: 3.44 MIL/uL — ABNORMAL LOW (ref 4.22–5.81)
RDW: 13.2 % (ref 11.5–15.5)
WBC: 8.9 10*3/uL (ref 4.0–10.5)
nRBC: 0 % (ref 0.0–0.2)

## 2022-10-09 LAB — BETA-HYDROXYBUTYRIC ACID: Beta-Hydroxybutyric Acid: >8 mmol/L — ABNORMAL HIGH (ref 0.05–0.27)

## 2022-10-09 MED ORDER — LORAZEPAM 1 MG PO TABS: 1.0000 mg | ORAL_TABLET | ORAL | Status: DC | PRN

## 2022-10-09 MED ORDER — DEXTROSE 50 % IV SOLN: 0.0000 mL | INTRAVENOUS | Status: DC | PRN

## 2022-10-09 MED ORDER — LACTATED RINGERS IV BOLUS
1000.0000 mL | Freq: Once | INTRAVENOUS | Status: AC
  Administered 2022-10-09: 1000 mL via INTRAVENOUS

## 2022-10-09 MED ORDER — CHLORHEXIDINE GLUCONATE CLOTH 2 % EX PADS
6.0000 | MEDICATED_PAD | Freq: Every day | CUTANEOUS | Status: DC
  Administered 2022-10-09: 6 via TOPICAL

## 2022-10-09 MED ORDER — HYDROMORPHONE HCL 1 MG/ML IJ SOLN: 1.0000 mg | INTRAMUSCULAR | Status: DC | PRN

## 2022-10-09 MED ORDER — ONDANSETRON HCL 4 MG/2ML IJ SOLN: 4.0000 mg | Freq: Four times a day (QID) | INTRAMUSCULAR | Status: DC | PRN

## 2022-10-09 MED ORDER — TRAZODONE HCL 50 MG PO TABS: 25.0000 mg | ORAL_TABLET | Freq: Every evening | ORAL | Status: DC | PRN

## 2022-10-09 MED ORDER — THIAMINE MONONITRATE 100 MG PO TABS
100.0000 mg | ORAL_TABLET | Freq: Every day | ORAL | Status: DC
  Administered 2022-10-09 – 2022-10-11 (×3): 100 mg via ORAL
  Filled 2022-10-09 (×4): qty 1

## 2022-10-09 MED ORDER — ORAL CARE MOUTH RINSE: 15.0000 mL | OROMUCOSAL | Status: DC | PRN

## 2022-10-09 MED ORDER — THIAMINE HCL 100 MG/ML IJ SOLN
100.0000 mg | Freq: Every day | INTRAMUSCULAR | Status: DC
  Filled 2022-10-09: qty 2

## 2022-10-09 MED ORDER — ACETAMINOPHEN 325 MG PO TABS: 650.0000 mg | ORAL_TABLET | Freq: Four times a day (QID) | ORAL | Status: DC | PRN

## 2022-10-09 MED ORDER — SODIUM CHLORIDE 0.9 % IV BOLUS
1000.0000 mL | Freq: Once | INTRAVENOUS | Status: AC
  Administered 2022-10-09: 1000 mL via INTRAVENOUS

## 2022-10-09 MED ORDER — LACTATED RINGERS IV SOLN: INTRAVENOUS | Status: DC

## 2022-10-09 MED ORDER — ENOXAPARIN SODIUM 40 MG/0.4ML IJ SOSY
40.0000 mg | PREFILLED_SYRINGE | Freq: Every day | INTRAMUSCULAR | Status: DC
  Administered 2022-10-09 – 2022-10-11 (×3): 40 mg via SUBCUTANEOUS
  Filled 2022-10-09 (×3): qty 0.4

## 2022-10-09 MED ORDER — DEXTROSE IN LACTATED RINGERS 5 % IV SOLN: INTRAVENOUS | Status: AC

## 2022-10-09 MED ORDER — ADULT MULTIVITAMIN W/MINERALS CH
1.0000 | ORAL_TABLET | Freq: Every day | ORAL | Status: DC
  Administered 2022-10-09 – 2022-10-11 (×3): 1 via ORAL
  Filled 2022-10-09 (×3): qty 1

## 2022-10-09 MED ORDER — SENNOSIDES-DOCUSATE SODIUM 8.6-50 MG PO TABS: 1.0000 | ORAL_TABLET | Freq: Every evening | ORAL | Status: DC | PRN

## 2022-10-09 MED ORDER — ALBUTEROL SULFATE (2.5 MG/3ML) 0.083% IN NEBU: 2.5000 mg | INHALATION_SOLUTION | RESPIRATORY_TRACT | Status: DC | PRN

## 2022-10-09 MED ORDER — DOLUTEGRAVIR-LAMIVUDINE 50-300 MG PO TABS
1.0000 | ORAL_TABLET | Freq: Every day | ORAL | Status: DC
  Administered 2022-10-09 – 2022-10-11 (×3): 1 via ORAL
  Filled 2022-10-09 (×4): qty 1

## 2022-10-09 MED ORDER — FOLIC ACID 1 MG PO TABS
1.0000 mg | ORAL_TABLET | Freq: Every day | ORAL | Status: DC
  Administered 2022-10-09 – 2022-10-11 (×3): 1 mg via ORAL
  Filled 2022-10-09 (×3): qty 1

## 2022-10-09 MED ORDER — ONDANSETRON HCL 4 MG PO TABS: 4.0000 mg | ORAL_TABLET | Freq: Four times a day (QID) | ORAL | Status: DC | PRN

## 2022-10-09 MED ORDER — PANTOPRAZOLE SODIUM 40 MG IV SOLR
40.0000 mg | Freq: Every day | INTRAVENOUS | Status: DC
  Administered 2022-10-09 – 2022-10-11 (×3): 40 mg via INTRAVENOUS
  Filled 2022-10-09 (×3): qty 10

## 2022-10-09 MED ORDER — ACETAMINOPHEN 650 MG RE SUPP: 650.0000 mg | Freq: Four times a day (QID) | RECTAL | Status: DC | PRN

## 2022-10-09 MED ORDER — INSULIN REGULAR(HUMAN) IN NACL 100-0.9 UT/100ML-% IV SOLN
INTRAVENOUS | Status: AC
  Administered 2022-10-09: 4.2 [IU]/h via INTRAVENOUS
  Filled 2022-10-09: qty 100

## 2022-10-09 NOTE — ED Notes (Signed)
Insulin cont at 4.2

## 2022-10-09 NOTE — ED Provider Notes (Signed)
[place] [facility] Provider Note   CSN: [id number] Arrival date & time: 10/09/22  0719     History  Chief Complaint  Patient presents with   Shortness of Breath   Hyperglycemia    Carl Reed is a 57 y.o. male.  Patient is a 57 year old male who has a history of insulin-dependent diabetes and alcohol use disorder.  He also has neuropathy.  He reports that he has been feeling bad for the last 2 to 3 days.  His sugars have been running high.  He feels like his sensor was not working correctly.  He has had some coughing as well and feels congested in his chest.  He has had some posttussive emesis but also some nausea and vomiting that started today.  He has been feeling fatigued.  He has had some loose stools.  He said he has had couple falls, the most recently was about a week ago.  He got up too fast and says he passed out.  He hit his head although he does not have a headache currently.  He also fell on his tailbone and feels like his tailbone may be broken.  He denies any other injuries.  He previously had a foot fracture which has healed.  He denies any fevers.  No associated chest pain.  He denies any other injuries from the fall.  He does say that he drinks daily.  His last alcohol intake was yesterday.  He does not feel any withdrawal symptoms currently.       Home Medications Prior to Admission medications   Medication Sig Start Date End Date Taking? Authorizing Provider  DOVATO 50-300 MG tablet TAKE 1 TABLET BY MOUTH ONCE DAILY. TAKE 2 HOURS BEFORE MULTIVITAMIN, SUCRALAFATE, AND FEEDING SUPPLEMENT 06/15/22   [name], MD  famotidine (PEPCID) 20 MG tablet Take 1 tablet by mouth once daily 03/15/22   [name], MD  gabapentin (NEURONTIN) 300 MG capsule Take 600 mg by mouth 3 (three) times daily. Patient not taking: Reported on 02/09/2022    [provider]  GVOKE HYPOPEN 2-PACK 1 MG/0.2ML SOAJ Inject 1 mL into the skin  once as needed (for hypoglycemia). Patient not taking: Reported on 02/09/2022 01/17/22   [provider]  insulin glargine, 2 Unit Dial, (TOUJEO MAX) 300 UNIT/ML Solostar Pen Inject 16 Units into the skin daily. Patient not taking: Reported on 02/09/2022 03/28/20   [name], MD  insulin lispro (HUMALOG) 100 UNIT/ML injection Continue as previously prescribed by [facility] endocrinologist depending on the scale Patient not taking: Reported on 02/09/2022 03/28/20 02/09/23  [name], MD  lisinopril (ZESTRIL) 5 MG tablet Take 5 mg by mouth daily. Patient not taking: Reported on 02/09/2022    [provider]  meloxicam (MOBIC) 15 MG tablet Take 15 mg by mouth daily. Patient not taking: Reported on 02/09/2022    [provider]  metFORMIN (GLUCOPHAGE) 500 MG tablet Take 500 mg by mouth 2 (two) times daily with a meal. Patient not taking: Reported on 02/09/2022 10/25/20   [provider]  pantoprazole (PROTONIX) 40 MG tablet Take 1 tablet (40 mg total) by mouth 2 (two) times daily for 14 days. 02/13/22 02/27/22  [name], MD  sucralfate (CARAFATE) 1 GM/10ML suspension Take 10 mLs (1 g total) by mouth 4 (four) times daily -  with meals and at bedtime. 02/13/22   [name], MD  tadalafil (CIALIS) 20 MG tablet Take 20 mg by  mouth daily as needed for erectile dysfunction. Patient not taking: Reported on 02/09/2022 12/14/21   [provider]  traZODone (DESYREL) 50 MG tablet Take 50 mg by mouth at bedtime as needed for sleep. Patient not taking: Reported on 02/09/2022 11/25/20   [provider]      Allergies    Patient has no known allergies.    Review of Systems   Review of Systems  Constitutional:  Positive for fatigue. Negative for chills, diaphoresis and fever.  HENT:  Negative for congestion, rhinorrhea and sneezing.   Eyes: Negative.   Respiratory:  Positive for cough and shortness of breath. Negative for chest  tightness.   Cardiovascular:  Negative for chest pain and leg swelling.  Gastrointestinal:  Positive for diarrhea, nausea and vomiting. Negative for abdominal pain and blood in stool.  Genitourinary:  Negative for difficulty urinating, flank pain, frequency and hematuria.  Musculoskeletal:  Positive for back pain. Negative for arthralgias.  Skin:  Negative for rash.  Neurological:  Negative for dizziness, speech difficulty, weakness, numbness and headaches.    Physical Exam Updated Vital Signs BP (!) 150/70 (BP Location: Right Arm)   Pulse (!) 107   Temp 98 F (36.7 C) (Oral)   Resp 16   Ht 5\' 11"  (1.803 m)   Wt 63.5 kg   SpO2 100%   BMI 19.53 kg/m  Physical Exam Constitutional:      Appearance: He is well-developed.     Comments: Smells of ketones  HENT:     Head: Normocephalic and atraumatic.  Eyes:     Pupils: Pupils are equal, round, and reactive to light.  Cardiovascular:     Rate and Rhythm: Normal rate and regular rhythm.     Heart sounds: Normal heart sounds.  Pulmonary:     Effort: Pulmonary effort is normal. No respiratory distress.     Breath sounds: Normal breath sounds. No wheezing or rales.  Chest:     Chest wall: No tenderness.  Abdominal:     General: Bowel sounds are normal.     Palpations: Abdomen is soft.     Tenderness: There is no abdominal tenderness. There is no guarding or rebound.  Musculoskeletal:        General: Normal range of motion.     Cervical back: Normal range of motion and neck supple.     Comments: No pain along the cervical, thoracic or lumbar spine.  There is pain over the coccyx.,  No pain on palpation of the extremities  Lymphadenopathy:     Cervical: No cervical adenopathy.  Skin:    General: Skin is warm and dry.     Findings: No rash.  Neurological:     General: No focal deficit present.     Mental Status: He is alert and oriented to person, place, and time.     ED Results / Procedures / Treatments   Labs (all labs  ordered are listed, but only abnormal results are displayed) Labs Reviewed  CBC - Abnormal; Notable for the following components:      Result Value   RBC 3.44 (*)    Hemoglobin 11.5 (*)    HCT 36.0 (*)    MCV 104.7 (*)    All other components within normal limits  URINALYSIS, ROUTINE W REFLEX MICROSCOPIC - Abnormal; Notable for the following components:   Glucose, UA >=500 (*)    Hgb urine dipstick TRACE (*)    Bilirubin Urine SMALL (*)    Ketones,  ur >=80 (*)    Protein, ur 100 (*)    All other components within normal limits  COMPREHENSIVE METABOLIC PANEL - Abnormal; Notable for the following components:   Sodium 130 (*)    Potassium 5.9 (*)    Chloride 89 (*)    CO2 8 (*)    Glucose, Bld 526 (*)    BUN 25 (*)    Creatinine, Ser 1.92 (*)    AST 55 (*)    ALT 46 (*)    Alkaline Phosphatase 136 (*)    Total Bilirubin 2.6 (*)    GFR, Estimated 41 (*)    Anion gap 33 (*)    All other components within normal limits  CBG MONITORING, ED - Abnormal; Notable for the following components:   Glucose-Capillary 500 (*)    All other components within normal limits  I-STAT VENOUS BLOOD GAS, ED - Abnormal; Notable for the following components:   pH, Ven 7.121 (*)    pCO2, Ven 16.0 (*)    pO2, Ven 55 (*)    Bicarbonate 5.2 (*)    TCO2 6 (*)    Acid-base deficit 22.0 (*)    Sodium 126 (*)    Potassium 5.4 (*)    Calcium, Ion 1.13 (*)    HCT 38.0 (*)    Hemoglobin 12.9 (*)    All other components within normal limits  URINALYSIS, MICROSCOPIC (REFLEX)  BETA-HYDROXYBUTYRIC ACID  BASIC METABOLIC PANEL  BASIC METABOLIC PANEL  BASIC METABOLIC PANEL  BASIC METABOLIC PANEL  CBG MONITORING, ED    EKG EKG Interpretation Date/Time:  Monday October 09 2022 07:32:16 EDT Ventricular Rate:  107 PR Interval:  152 QRS Duration:  79 QT Interval:  324 QTC Calculation: 433 R Axis:   72  Text Interpretation: Sinus tachycardia Anteroseptal infarct, old Nonspecific T abnormalities, lateral  leads Confirmed by Rolan Bucco [phone]) on 10/09/2022 7:56:13 AM  Radiology DG Sacrum/Coccyx  Result Date: 10/09/2022 CLINICAL DATA:  Syncope 1 week ago, pain in sacrum/coccyx area. EXAM: SACRUM AND COCCYX - 2+ VIEW COMPARISON:  None Available. FINDINGS: There is no definite acute fracture of the sacrum/coccyx. Femoroacetabular alignment is normal. The SI joints and symphysis pubis are intact. There is moderate left and mild right femoroacetabular joint space narrowing sclerosis of the femoral heads, left significantly worse than right, is consistent with avascular necrosis as seen on prior MRI. IMPRESSION: 1. No definite acute finding in the sacrum/coccyx. 2. Avascular necrosis of the femoral heads, left worse than right, and left worse than right femoroacetabular joint space narrowing. Electronically Signed   By: Lesia Hausen M.D.   On: 10/09/2022 08:27   DG Chest 2 View  Result Date: 10/09/2022 CLINICAL DATA:  Syncope 1 week ago, shortness of breath EXAM: CHEST - 2 VIEW COMPARISON:  Chest radiograph 02/08/2022 FINDINGS: The cardiomediastinal silhouette is normal There is no focal consolidation or pulmonary edema. There is no pleural effusion or pneumothorax There is no acute osseous abnormality. IMPRESSION: No radiographic evidence of acute cardiopulmonary process. Electronically Signed   By: Lesia Hausen M.D.   On: 10/09/2022 08:25   CT Head Wo Contrast  Result Date: 10/09/2022 CLINICAL DATA:  Head trauma, moderate-severe EXAM: CT HEAD WITHOUT CONTRAST TECHNIQUE: Contiguous axial images were obtained from the base of the skull through the vertex without intravenous contrast. RADIATION DOSE REDUCTION: This exam was performed according to the departmental dose-optimization program which includes automated exposure control, adjustment of the mA and/or kV according to patient size and/or  use of iterative reconstruction technique. COMPARISON:  None Available. FINDINGS: Brain: No evidence of acute  infarction, hemorrhage, hydrocephalus, extra-axial collection or mass lesion/mass effect. Sequela of mild chronic microvascular ischemic change Vascular: No hyperdense vessel or unexpected calcification. Skull: Normal. Negative for fracture or focal lesion. Sinuses/Orbits: No middle ear or mastoid effusion. Paranasal sinuses are clear. Orbits are unremarkable. Other: None. IMPRESSION: No CT evidence of intracranial injury. Electronically Signed   By: Lorenza Cambridge M.D.   On: 10/09/2022 08:20    Procedures Procedures    Medications Ordered in ED Medications  insulin regular, human (MYXREDLIN) 100 units/ 100 mL infusion (4.2 Units/hr Intravenous New Bag/Given 10/09/22 0911)  lactated ringers infusion ( Intravenous New Bag/Given 10/09/22 0915)  dextrose 5 % in lactated ringers infusion (has no administration in time range)  dextrose 50 % solution 0-50 mL (has no administration in time range)  lactated ringers bolus 1,000 mL (1,000 mLs Intravenous New Bag/Given 10/09/22 0815)  sodium chloride 0.9 % bolus 1,000 mL (1,000 mLs Intravenous New Bag/Given 10/09/22 0933)    ED Course/ Medical Decision Making/ A&P                             Medical Decision Making Amount and/or Complexity of Data Reviewed Labs: ordered. Radiology: ordered.  Risk Prescription drug management. Decision regarding hospitalization.   Patient is a 57 year old male who presents with nausea and vomiting.  His glucose is over 500.  Labs reviewed and show acidosis with a pH of 7.1.  His bicarb is markedly low and he has a large anion gap.  He also has evidence of an AKI with an elevated creatinine.  His potassium is mildly elevated at 5.9.  He was started on IV fluids and Endo tool.  He does not have a fever.  He does have a cough and he had a chest x-ray which was interpreted by me and confirmed by the radiologist to show no evidence of pneumonia.  He also has had some falls.  Head CT shows no evidence of intracranial hemorrhage  or other acute abnormality.  X-rays of his sacrum/coccyx do not show any evidence of acute fracture.  I discussed the patient with Dr. Erenest Blank who has accepted the patient for admission to either [facility] or [facility].  CRITICAL CARE Performed by: Rolan Bucco Total critical care time: 60 minutes Critical care time was exclusive of separately billable procedures and treating other patients. Critical care was necessary to treat or prevent imminent or life-threatening deterioration. Critical care was time spent personally by me on the following activities: development of treatment plan with patient and/or surrogate as well as nursing, discussions with consultants, evaluation of patient's response to treatment, examination of patient, obtaining history from patient or surrogate, ordering and performing treatments and interventions, ordering and review of laboratory studies, ordering and review of radiographic studies, pulse oximetry and re-evaluation of patient's condition.   Final Clinical Impression(s) / ED Diagnoses Final diagnoses:  Diabetic ketoacidosis without coma associated with type 1 diabetes mellitus (HCC)  AKI (acute kidney injury) [facility])    Rx / DC Orders ED Discharge Orders     None         Rolan Bucco, MD 10/09/22 [phone]

## 2022-10-09 NOTE — ED Triage Notes (Signed)
States had some abd pain yesterday n/v and sob states sugar has been running high in the 400

## 2022-10-09 NOTE — Inpatient Diabetes Management (Addendum)
Inpatient Diabetes Program Recommendations  AACE/ADA: New Consensus Statement on Inpatient Glycemic Control (2015)  Target Ranges:  Prepandial:   less than 140 mg/dL      Peak postprandial:   less than 180 mg/dL (1-2 hours)      Critically ill patients:  140 - 180 mg/dL   Lab Results  Component Value Date   GLUCAP 324 (H) 10/09/2022   HGBA1C 9.0 (H) 02/12/2022    Review of Glycemic Control  Latest Reference Range & Units 10/09/22 10:14 10/09/22 11:03 10/09/22 11:46  Glucose-Capillary 70 - 99 mg/dL 161 (H) 096 (H) 045 (H)  (H): Data is abnormally high Diabetes history: DM 1 Outpatient Diabetes medications: See's Dr [name] (endocrinology) Novolog scale with built in meal coverage- Base dose plus approximately 1 unit for every 50 mg/dL >409 mg/dL  10 units -breakfast 3 units- lunch 14 units -dinner Toujeo insulin 18 units subcutaneous in AM  Current orders for Inpatient glycemic control:  IV insulin/DKA  Inpatient Diabetes Program Recommendations:    Agree with orders. Patient definitely needs insulin at home.  Will see patient when appropriate.   Addendum:  73- briefly visited with patient at bedside.  He notes that he ate a Hamburger on Saturday (7/6) and started throwing up afterwards.  He says he was taking his long acting insulin?  However unclear if he was monitoring closely and taking rapid acting insulin.  He has a FLS 2 sensor for monitoring but unsure if he was wearing over the weekend.  Will follow up with patient on 7/9.    Thanks,  Beryl Meager, RN, BC-ADM Inpatient Diabetes Coordinator Pager [phone]  (8a-5p)

## 2022-10-09 NOTE — ED Notes (Signed)
Ice chips RN approved

## 2022-10-09 NOTE — H&P (Signed)
History and Physical  Carl Reed ZOX:[id number] DOB: Jan 17, 1966 DOA: 10/09/2022  PCP: Patient, No Pcp Per   Chief Complaint: vomiting, high blood sugar   HPI: Carl Reed is a 57 y.o. male with medical history significant for poorly controlled type 1 diabetes, HIV, suspected gastroparesis being admitted to the hospital with recurrent vomiting and DKA.  Patient states that he was in his usual state of health until yesterday, when he had loss of appetite, abdominal discomfort, and vomiting.  Only thing he has been able to keep down in the last 24 hours is a can of soup.  Denies any fevers, chest pain, diarrhea.  No blood in his emesis.  States that his blood sugar was reading in the 400s.  He was supposed to follow-up as an outpatient with gastroenterology due to concern for his recurrent abdominal discomfort, and possibility of gastroparesis, however has not done so yet.  He does drink alcohol daily, his last drink of alcohol was 7/7.  ED Course: He presented to [facility], where he was found to be borderline tachycardic, blood pressure 150/70 saturating well on room air.  Lab work was done and demonstrates glucose 526, creatinine 1.92 up from baseline of about 1.1, AST 55/ALT 46, hemoglobin 12.  He was given IV fluids, started on insulin drip per DKA protocol, and hospitalist contacted for admission.  He also had CT scan of the head and x-ray of the sacrum due to reported fall at home.  Review of Systems: Please see HPI for pertinent positives and negatives. A complete 10 system review of systems are otherwise negative.  Past Medical History:  Diagnosis Date   Diabetes mellitus without complication (HCC)    HIV (human immunodeficiency virus infection) (HCC)    Hypertension    History reviewed. No pertinent surgical history.  Social History:  reports that he has been smoking cigarettes. He has been smoking an average of 1 pack per day. He has never used smokeless tobacco. He reports  current alcohol use of about 4.0 standard drinks of alcohol per week. He reports that he does not use drugs.   No Known Allergies  History reviewed. No pertinent family history.   Prior to Admission medications   Medication Sig Start Date End Date Taking? Authorizing Provider  DOVATO 50-300 MG tablet TAKE 1 TABLET BY MOUTH ONCE DAILY. TAKE 2 HOURS BEFORE MULTIVITAMIN, SUCRALAFATE, AND FEEDING SUPPLEMENT 06/15/22   [name], MD  famotidine (PEPCID) 20 MG tablet Take 1 tablet by mouth once daily 03/15/22   [name], MD  gabapentin (NEURONTIN) 300 MG capsule Take 600 mg by mouth 3 (three) times daily. Patient not taking: Reported on 02/09/2022    [provider]  GVOKE HYPOPEN 2-PACK 1 MG/0.2ML SOAJ Inject 1 mL into the skin once as needed (for hypoglycemia). Patient not taking: Reported on 02/09/2022 01/17/22   [provider]  insulin glargine, 2 Unit Dial, (TOUJEO MAX) 300 UNIT/ML Solostar Pen Inject 16 Units into the skin daily. Patient not taking: Reported on 02/09/2022 03/28/20   [name], MD  insulin lispro (HUMALOG) 100 UNIT/ML injection Continue as previously prescribed by [facility] endocrinologist depending on the scale Patient not taking: Reported on 02/09/2022 03/28/20 02/09/23  [name], MD  lisinopril (ZESTRIL) 5 MG tablet Take 5 mg by mouth daily. Patient not taking: Reported on 02/09/2022    [provider]  meloxicam (MOBIC) 15 MG tablet Take 15 mg by mouth daily. Patient not taking: Reported on 02/09/2022  [provider]  metFORMIN (GLUCOPHAGE) 500 MG tablet Take 500 mg by mouth 2 (two) times daily with a meal. Patient not taking: Reported on 02/09/2022 10/25/20   [provider]  pantoprazole (PROTONIX) 40 MG tablet Take 1 tablet (40 mg total) by mouth 2 (two) times daily for 14 days. 02/13/22 02/27/22  [name], MD  sucralfate (CARAFATE) 1 GM/10ML suspension Take 10 mLs (1 g total) by mouth  4 (four) times daily -  with meals and at bedtime. 02/13/22   [name], MD  tadalafil (CIALIS) 20 MG tablet Take 20 mg by mouth daily as needed for erectile dysfunction. Patient not taking: Reported on 02/09/2022 12/14/21   [provider]  traZODone (DESYREL) 50 MG tablet Take 50 mg by mouth at bedtime as needed for sleep. Patient not taking: Reported on 02/09/2022 11/25/20   [provider]    Physical Exam: BP 134/73 (BP Location: Right Arm)   Pulse 91   Temp 98.2 F (36.8 C) (Oral)   Resp 20   Ht 5\' 11"  (1.803 m)   Wt 62.4 kg   SpO2 100%   BMI 19.19 kg/m   General:  Alert, oriented, calm, in no acute distress, looks very comfortable, pleasant and cooperative Eyes: EOMI, clear conjuctivae, white sclerea Neck: supple, no masses, trachea mildline  Cardiovascular: RRR, no murmurs or rubs, no peripheral edema  Respiratory: clear to auscultation bilaterally, no wheezes, no crackles  Abdomen: soft, nontender, nondistended, normal bowel tones heard  Skin: dry, no rashes  Musculoskeletal: no joint effusions, normal range of motion  Psychiatric: appropriate affect, normal speech  Neurologic: extraocular muscles intact, clear speech, moving all extremities with intact sensorium          Labs on Admission:  Basic Metabolic Panel: Recent Labs  Lab 10/09/22 0735 10/09/22 0828  NA 130* 126*  K 5.9* 5.4*  CL 89*  --   CO2 8*  --   GLUCOSE 526*  --   BUN 25*  --   CREATININE 1.92*  --   CALCIUM 9.1  --    Liver Function Tests: Recent Labs  Lab 10/09/22 0735  AST 55*  ALT 46*  ALKPHOS 136*  BILITOT 2.6*  PROT 7.6  ALBUMIN 4.1   No results for input(s): "LIPASE", "AMYLASE" in the last 168 hours. No results for input(s): "AMMONIA" in the last 168 hours. CBC: Recent Labs  Lab 10/09/22 0735 10/09/22 0828  WBC 8.9  --   HGB 11.5* 12.9*  HCT 36.0* 38.0*  MCV 104.7*  --   PLT 234  --    Cardiac Enzymes: No results for input(s): "CKTOTAL", "CKMB",  "CKMBINDEX", "TROPONINI" in the last 168 hours.  BNP (last 3 results) No results for input(s): "BNP" in the last 8760 hours.  ProBNP (last 3 results) No results for input(s): "PROBNP" in the last 8760 hours.  CBG: Recent Labs  Lab 10/09/22 0737 10/09/22 1014 10/09/22 1103 10/09/22 1146  GLUCAP 500* 494* 406* 324*    Radiological Exams on Admission: DG Sacrum/Coccyx  Result Date: 10/09/2022 CLINICAL DATA:  Syncope 1 week ago, pain in sacrum/coccyx area. EXAM: SACRUM AND COCCYX - 2+ VIEW COMPARISON:  None Available. FINDINGS: There is no definite acute fracture of the sacrum/coccyx. Femoroacetabular alignment is normal. The SI joints and symphysis pubis are intact. There is moderate left and mild right femoroacetabular joint space narrowing sclerosis of the femoral heads, left significantly worse than right, is consistent with avascular necrosis as seen on prior MRI.  IMPRESSION: 1. No definite acute finding in the sacrum/coccyx. 2. Avascular necrosis of the femoral heads, left worse than right, and left worse than right femoroacetabular joint space narrowing. Electronically Signed   By: Lesia Hausen M.D.   On: 10/09/2022 08:27   DG Chest 2 View  Result Date: 10/09/2022 CLINICAL DATA:  Syncope 1 week ago, shortness of breath EXAM: CHEST - 2 VIEW COMPARISON:  Chest radiograph 02/08/2022 FINDINGS: The cardiomediastinal silhouette is normal There is no focal consolidation or pulmonary edema. There is no pleural effusion or pneumothorax There is no acute osseous abnormality. IMPRESSION: No radiographic evidence of acute cardiopulmonary process. Electronically Signed   By: Lesia Hausen M.D.   On: 10/09/2022 08:25   CT Head Wo Contrast  Result Date: 10/09/2022 CLINICAL DATA:  Head trauma, moderate-severe EXAM: CT HEAD WITHOUT CONTRAST TECHNIQUE: Contiguous axial images were obtained from the base of the skull through the vertex without intravenous contrast. RADIATION DOSE REDUCTION: This exam was  performed according to the departmental dose-optimization program which includes automated exposure control, adjustment of the mA and/or kV according to patient size and/or use of iterative reconstruction technique. COMPARISON:  None Available. FINDINGS: Brain: No evidence of acute infarction, hemorrhage, hydrocephalus, extra-axial collection or mass lesion/mass effect. Sequela of mild chronic microvascular ischemic change Vascular: No hyperdense vessel or unexpected calcification. Skull: Normal. Negative for fracture or focal lesion. Sinuses/Orbits: No middle ear or mastoid effusion. Paranasal sinuses are clear. Orbits are unremarkable. Other: None. IMPRESSION: No CT evidence of intracranial injury. Electronically Signed   By: Lorenza Cambridge M.D.   On: 10/09/2022 08:20    Assessment/Plan This is a 57 year old gentleman with a history of HIV, suspected gastroparesis, poorly controlled insulin-dependent type 1 diabetes admitted to the hospital with diabetic ketoacidosis.  DKA-with metabolic acidosis, severe hyperglycemia, likely from dehydration due to intractable vomiting. -Inpatient admission to stepdown unit -Continue LR infusion -Keep n.p.o. for now -Insulin drip -Monitor every 4 hours BMP  Intractable vomiting and abdominal discomfort-in the setting of uncontrolled diabetes, suspicious for gastroparesis -Supportive care with IV fluids, pain and nausea medication as needed  Poorly controlled type 1 diabetes-last hemoglobin A1c in our system 9.0 -Check hemoglobin A1c -Diabetic diet when advanced  Abnormal LFTs-could be due to recent vomiting, or alcohol use though not elevated in classic 2-1 pattern. -Check acute hepatitis panel -Trend  History of dyspepsia-continue PPI, switch to IV for now  HIV-continue Dovato  AKI-likely prerenal azotemia due to relative dehydration -Hydrate as above -Avoid nephrotoxins as able -Follow renal function with morning labs  DVT prophylaxis: Lovenox      Code Status: Full Code  Consults called: None  Admission status: The appropriate patient status for this patient is INPATIENT. Inpatient status is judged to be reasonable and necessary in order to provide the required intensity of service to ensure the patient's safety. The patient's presenting symptoms, physical exam findings, and initial radiographic and laboratory data in the context of their chronic comorbidities is felt to place them at high risk for further clinical deterioration. Furthermore, it is not anticipated that the patient will be medically stable for discharge from the hospital within 2 midnights of admission.    I certify that at the point of admission it is my clinical judgment that the patient will require inpatient hospital care spanning beyond 2 midnights from the point of admission due to high intensity of service, high risk for further deterioration and high frequency of surveillance required  Time spent: 59 minutes  Aashvi Rezabek M  Kirby Crigler MD [facility] Pager [phone]  If 7PM-7AM, please contact night-coverage [email or web address] Password [facility]  10/09/2022, 12:24 PM

## 2022-10-10 ENCOUNTER — Other Ambulatory Visit (HOSPITAL_COMMUNITY): Payer: Self-pay

## 2022-10-10 DIAGNOSIS — R7989 Other specified abnormal findings of blood chemistry: Secondary | ICD-10-CM | POA: Insufficient documentation

## 2022-10-10 DIAGNOSIS — N179 Acute kidney failure, unspecified: Secondary | ICD-10-CM | POA: Diagnosis not present

## 2022-10-10 DIAGNOSIS — E1010 Type 1 diabetes mellitus with ketoacidosis without coma: Secondary | ICD-10-CM | POA: Diagnosis not present

## 2022-10-10 LAB — CBC
HCT: 29.3 % — ABNORMAL LOW (ref 39.0–52.0)
Hemoglobin: 10.2 g/dL — ABNORMAL LOW (ref 13.0–17.0)
MCH: 34.6 pg — ABNORMAL HIGH (ref 26.0–34.0)
MCHC: 34.8 g/dL (ref 30.0–36.0)
MCV: 99.3 fL (ref 80.0–100.0)
Platelets: 167 10*3/uL (ref 150–400)
RBC: 2.95 MIL/uL — ABNORMAL LOW (ref 4.22–5.81)
RDW: 12.4 % (ref 11.5–15.5)
WBC: 6 10*3/uL (ref 4.0–10.5)
nRBC: 0 % (ref 0.0–0.2)

## 2022-10-10 LAB — BASIC METABOLIC PANEL
Anion gap: 8 (ref 5–15)
BUN: 17 mg/dL (ref 6–20)
CO2: 22 mmol/L (ref 22–32)
Calcium: 8.2 mg/dL — ABNORMAL LOW (ref 8.9–10.3)
Chloride: 105 mmol/L (ref 98–111)
Creatinine, Ser: 1.1 mg/dL (ref 0.61–1.24)
GFR, Estimated: >60 mL/min (ref >60)
Glucose, Bld: 277 mg/dL — ABNORMAL HIGH (ref 70–99)
Potassium: 3.8 mmol/L (ref 3.5–5.1)
Sodium: 135 mmol/L (ref 135–145)

## 2022-10-10 LAB — GLUCOSE, CAPILLARY
Glucose-Capillary: 126 mg/dL — ABNORMAL HIGH (ref 70–99)
Glucose-Capillary: 108 mg/dL — ABNORMAL HIGH (ref 70–99)
Glucose-Capillary: 297 mg/dL — ABNORMAL HIGH (ref 70–99)
Glucose-Capillary: 295 mg/dL — ABNORMAL HIGH (ref 70–99)
Glucose-Capillary: 119 mg/dL — ABNORMAL HIGH (ref 70–99)
Glucose-Capillary: 70 mg/dL (ref 70–99)
Glucose-Capillary: 151 mg/dL — ABNORMAL HIGH (ref 70–99)
Glucose-Capillary: 133 mg/dL — ABNORMAL HIGH (ref 70–99)

## 2022-10-10 LAB — COMPREHENSIVE METABOLIC PANEL
ALT: 31 U/L (ref 0–44)
AST: 31 U/L (ref 15–41)
Albumin: 3.1 g/dL — ABNORMAL LOW (ref 3.5–5.0)
Alkaline Phosphatase: 93 U/L (ref 38–126)
Anion gap: 8 (ref 5–15)
BUN: 19 mg/dL (ref 6–20)
CO2: 22 mmol/L (ref 22–32)
Calcium: 8.5 mg/dL — ABNORMAL LOW (ref 8.9–10.3)
Chloride: 105 mmol/L (ref 98–111)
Creatinine, Ser: 1.14 mg/dL (ref 0.61–1.24)
GFR, Estimated: >60 mL/min (ref >60)
Glucose, Bld: 138 mg/dL — ABNORMAL HIGH (ref 70–99)
Potassium: 3.9 mmol/L (ref 3.5–5.1)
Sodium: 135 mmol/L (ref 135–145)
Total Bilirubin: 1.3 mg/dL — ABNORMAL HIGH (ref 0.3–1.2)
Total Protein: 5.8 g/dL — ABNORMAL LOW (ref 6.5–8.1)

## 2022-10-10 LAB — HEMOGLOBIN A1C
Hgb A1c MFr Bld: 9.4 % — ABNORMAL HIGH (ref 4.8–5.6)
Mean Plasma Glucose: 223 mg/dL

## 2022-10-10 LAB — BETA-HYDROXYBUTYRIC ACID: Beta-Hydroxybutyric Acid: 1.58 mmol/L — ABNORMAL HIGH (ref 0.05–0.27)

## 2022-10-10 MED ORDER — GABAPENTIN 300 MG PO CAPS
300.0000 mg | ORAL_CAPSULE | Freq: Three times a day (TID) | ORAL | Status: DC
  Administered 2022-10-10 – 2022-10-11 (×4): 300 mg via ORAL
  Filled 2022-10-10 (×4): qty 1

## 2022-10-10 MED ORDER — INSULIN ASPART 100 UNIT/ML IJ SOLN: 0.0000 [IU] | Freq: Three times a day (TID) | INTRAMUSCULAR | Status: DC

## 2022-10-10 MED ORDER — INSULIN DETEMIR 100 UNIT/ML ~~LOC~~ SOLN: 10.0000 [IU] | Freq: Two times a day (BID) | SUBCUTANEOUS | Status: DC

## 2022-10-10 MED ORDER — INSULIN ASPART 100 UNIT/ML IJ SOLN: 0.0000 [IU] | Freq: Every day | INTRAMUSCULAR | Status: DC

## 2022-10-10 MED ORDER — INSULIN ASPART 100 UNIT/ML IJ SOLN
0.0000 [IU] | Freq: Every day | INTRAMUSCULAR | Status: DC
  Administered 2022-10-10: 2 [IU] via SUBCUTANEOUS

## 2022-10-10 MED ORDER — SODIUM CHLORIDE 0.9% FLUSH: 3.0000 mL | INTRAVENOUS | Status: DC | PRN

## 2022-10-10 MED ORDER — SODIUM CHLORIDE 0.9% FLUSH
3.0000 mL | Freq: Two times a day (BID) | INTRAVENOUS | Status: DC
  Administered 2022-10-10 (×3): 3 mL via INTRAVENOUS

## 2022-10-10 MED ORDER — INSULIN DETEMIR 100 UNIT/ML ~~LOC~~ SOLN
10.0000 [IU] | Freq: Two times a day (BID) | SUBCUTANEOUS | Status: DC
  Administered 2022-10-10 (×2): 10 [IU] via SUBCUTANEOUS
  Filled 2022-10-10 (×3): qty 0.1

## 2022-10-10 MED ORDER — INSULIN ASPART 100 UNIT/ML IJ SOLN
3.0000 [IU] | Freq: Once | INTRAMUSCULAR | Status: AC
  Administered 2022-10-10: 3 [IU] via SUBCUTANEOUS

## 2022-10-10 MED ORDER — INSULIN ASPART 100 UNIT/ML IJ SOLN
0.0000 [IU] | Freq: Three times a day (TID) | INTRAMUSCULAR | Status: DC
  Administered 2022-10-10 – 2022-10-11 (×2): 2 [IU] via SUBCUTANEOUS

## 2022-10-10 MED ORDER — INSULIN GLARGINE-YFGN 100 UNIT/ML ~~LOC~~ SOLN
5.0000 [IU] | Freq: Once | SUBCUTANEOUS | Status: AC
  Administered 2022-10-10: 5 [IU] via SUBCUTANEOUS
  Filled 2022-10-10: qty 0.05

## 2022-10-10 MED ORDER — INSULIN ASPART 100 UNIT/ML IJ SOLN: 4.0000 [IU] | Freq: Three times a day (TID) | INTRAMUSCULAR | Status: DC

## 2022-10-10 MED ORDER — INSULIN ASPART 100 UNIT/ML IJ SOLN
4.0000 [IU] | Freq: Three times a day (TID) | INTRAMUSCULAR | Status: DC
  Administered 2022-10-10 – 2022-10-11 (×3): 4 [IU] via SUBCUTANEOUS

## 2022-10-10 MED ORDER — SODIUM CHLORIDE 0.9 % IV SOLN: 250.0000 mL | INTRAVENOUS | Status: DC | PRN

## 2022-10-10 NOTE — Inpatient Diabetes Management (Signed)
Inpatient Diabetes Program Recommendations  AACE/ADA: New Consensus Statement on Inpatient Glycemic Control (2015)  Target Ranges:  Prepandial:   less than 140 mg/dL      Peak postprandial:   less than 180 mg/dL (1-2 hours)      Critically ill patients:  140 - 180 mg/dL   Lab Results  Component Value Date   GLUCAP 70 10/10/2022   HGBA1C 9.4 (H) 10/09/2022    Review of Glycemic Control  Latest Reference Range & Units 10/09/22 23:47 10/10/22 01:13 10/10/22 02:21 10/10/22 03:39 10/10/22 04:50 10/10/22 07:55 10/10/22 11:59  Glucose-Capillary 70 - 99 mg/dL 161 (H) 096 (H) 045 (H) 297 (H) 295 (H) 119 (H) 70   Diabetes history: DM 1 Outpatient Diabetes medications: Diabetes history: DM 1 Outpatient Diabetes medications: See's Dr [name] (endocrinology) Novolog scale with built in meal coverage- Base dose plus approximately 1 unit for every 50 mg/dL >409 mg/dL  10 units -breakfast 3 units- lunch 14 units -dinner Toujeo insulin 18 units subcutaneous in AM  Current orders for Inpatient glycemic control:  Novolog 0-9 units tid with meals and HS Novolog 4 units tid with meals Levemir 10 units bid  Inpatient Diabetes Program Recommendations:    Spoke to patient at bedside.  He uses FSL 2 at home for CGM.  Gave him sample for [employer] 3 and explained how to use and download app.  Explained that it only requires one scan and then blood glucose is continuously available on app.  He states that he believes the vomiting precipitated DKA.  Discussed sick day rules including keto sticks, more frequent monitoring, and importance of taking insulin.  Will attach Sick day rules to D/c paper work as well.  Patient appreciative of information and plans to try FSL 3 and call outpatient MD for refills.    Thanks,  Beryl Meager, RN, BC-ADM Inpatient Diabetes Coordinator Pager [phone]  (8a-5p)

## 2022-10-10 NOTE — TOC Transition Note (Signed)
Transition of Care [facility]) - CM/SW Discharge Note   Patient Details  Name: Carl Reed MRN: [id number] Date of Birth: 04/24/1965  [facility]) CM/SW Contact:  Amada Jupiter, LCSW Phone Number: 10/10/2022, 2:38 PM   Clinical Narrative:    Received TOC order to assist with potential SA resources and medication assistance needs.  Met with pt who confirms he has insurance coverage via employment and states no concerns about medication affordability at this time.  He does have PCP in [facility] as well as endocrinology coverage. Have added PCP info to pt's chart.    Addressed possible SA concerns and pt denies any significant concerns but reports he has received past treatment in [place].  He is receptive to having local SA resource information placed on AVS - done.  Denies any further dc concerns.  Hopeful he will be able to dc tomorrow.  Final next level of care: Home/Self Care Barriers to Discharge: No Barriers Identified   Patient Goals and CMS Choice      Discharge Placement                         Discharge Plan and Services Additional resources added to the After Visit Summary for                                       Social Determinants of Health (SDOH) Interventions SDOH Screenings   Food Insecurity: No Food Insecurity (10/09/2022)  Housing: Low Risk  (10/09/2022)  Transportation Needs: No Transportation Needs (10/09/2022)  Utilities: Not At Risk (10/09/2022)  Tobacco Use: High Risk (10/09/2022)     Readmission Risk Interventions    10/10/2022    2:37 PM  Readmission Risk Prevention Plan  Post Dischage Appt Complete  Medication Screening Complete  Transportation Screening Complete

## 2022-10-10 NOTE — Progress Notes (Addendum)
[facility] PROGRESS NOTE    Progress Note  Carl Reed  WUJ:[id number] DOB: 15-May-1965 DOA: 10/09/2022 PCP: Carl Reed     Brief Narrative:   Carl Reed is an 57 y.o. male past medical history of controlled diabetes mellitus type 1, HIV suspect gastroparesis admitted to the hospital for recurrent vomiting in the setting of DKA, blood sugar at home was over 400 he had a follow-up appointment with GI for possible gastroparesis in the ED blood glucose was above 500 creatinine 1.9 with started on IV insulin  Assessment/Plan:   DKA (diabetic ketoacidosis) (HCC)/poorly controlled diabetes mellitus type 1: Started on insulin drip admitted to stepdown and fluid resuscitated. He has now been transition to Levemir plus sliding scale insulin. His blood glucose ranging around 2 72-95. A1c was 9.4. Increase long-acting insulin twice a day.,  Start him on sliding scale insulin. Has no signs of infection, question compliance with his insulin. Transferred to MedSurg.  HIV (human immunodeficiency virus infection) (HCC) Resume HIV therapy.  Abnormal LFTs Acute hepatitis panel is negative. Elevation in LFTs has now resolved. Question due to nausea and vomiting.  AKI (acute kidney injury) (HCC) Likely prerenal azotemia resolved with IV fluid resuscitation.    DVT prophylaxis: lovenox Family Communication:none Status is: Inpatient, transferred to MedSurg. Remains inpatient appropriate because: DKA    Code Status:     Code Status Orders  (From admission, onward)           Start     Ordered   10/09/22 1224  Full code  Continuous       Question:  By:  Answer:  Consent: discussion documented in EHR   10/09/22 1224           Code Status History     Date Active Date Inactive Code Status Order ID Comments User Context   02/08/2022 1502 02/13/2022 1744 Full Code [id number]  Champ Mungo, DO ED   03/27/2020 1706 03/28/2020 1903 Full Code [id number]  Synetta Fail, MD Inpatient         IV Access:   Peripheral IV   Procedures and diagnostic studies:   DG Sacrum/Coccyx  Result Date: 10/09/2022 CLINICAL DATA:  Syncope 1 week ago, pain in sacrum/coccyx area. EXAM: SACRUM AND COCCYX - 2+ VIEW COMPARISON:  None Available. FINDINGS: There is no definite acute fracture of the sacrum/coccyx. Femoroacetabular alignment is normal. The SI joints and symphysis pubis are intact. There is moderate left and mild right femoroacetabular joint space narrowing sclerosis of the femoral heads, left significantly worse than right, is consistent with avascular necrosis as seen on prior MRI. IMPRESSION: 1. No definite acute finding in the sacrum/coccyx. 2. Avascular necrosis of the femoral heads, left worse than right, and left worse than right femoroacetabular joint space narrowing. Electronically Signed   By: Lesia Hausen M.D.   On: 10/09/2022 08:27   DG Chest 2 View  Result Date: 10/09/2022 CLINICAL DATA:  Syncope 1 week ago, shortness of breath EXAM: CHEST - 2 VIEW COMPARISON:  Chest radiograph 02/08/2022 FINDINGS: The cardiomediastinal silhouette is normal There is no focal consolidation or pulmonary edema. There is no pleural effusion or pneumothorax There is no acute osseous abnormality. IMPRESSION: No radiographic evidence of acute cardiopulmonary process. Electronically Signed   By: Lesia Hausen M.D.   On: 10/09/2022 08:25   CT Head Wo Contrast  Result Date: 10/09/2022 CLINICAL DATA:  Head trauma, moderate-severe EXAM: CT HEAD WITHOUT CONTRAST TECHNIQUE: Contiguous axial images were obtained from  the base of the skull through the vertex without intravenous contrast. RADIATION DOSE REDUCTION: This exam was performed according to the departmental dose-optimization program which includes automated exposure control, adjustment of the mA and/or kV according to patient size and/or use of iterative reconstruction technique. COMPARISON:  None Available. FINDINGS:  Brain: No evidence of acute infarction, hemorrhage, hydrocephalus, extra-axial collection or mass lesion/mass effect. Sequela of mild chronic microvascular ischemic change Vascular: No hyperdense vessel or unexpected calcification. Skull: Normal. Negative for fracture or focal lesion. Sinuses/Orbits: No middle ear or mastoid effusion. Paranasal sinuses are clear. Orbits are unremarkable. Other: None. IMPRESSION: No CT evidence of intracranial injury. Electronically Signed   By: Lorenza Cambridge M.D.   On: 10/09/2022 08:20     Medical Consultants:   None.   Subjective:    Carl Reed feels better tolerating his diet.  Objective:    Vitals:   10/09/22 2028 10/10/22 0005 10/10/22 0034 10/10/22 0415  BP:  134/65  132/63  Pulse: 81 86 86 95  Resp: 13 18 15 17   Temp: 97.8 F (36.6 C)  98.2 F (36.8 C)   TempSrc: Oral  Oral   SpO2: 100% 100% 100% 100%  Weight:      Height:       SpO2: 100 %   Intake/Output Summary (Last 24 hours) at 10/10/2022 0734 Last data filed at 10/10/2022 0500 Gross Reed 24 hour  Intake 3062.73 ml  Output 1600 ml  Net 1462.73 ml   Filed Weights   10/09/22 0734 10/09/22 1148  Weight: 63.5 kg 62.4 kg    Exam: General exam: In no acute distress. Respiratory system: Good air movement and clear to auscultation. Cardiovascular system: S1 & S2 heard, RRR. No JVD. Gastrointestinal system: Abdomen is nondistended, soft and nontender.  Extremities: No pedal edema. Skin: No rashes, lesions or ulcers Psychiatry: Judgement and insight appear normal. Mood & affect appropriate.    Data Reviewed:    Labs: Basic Metabolic Panel: Recent Labs  Lab 10/09/22 1219 10/09/22 1653 10/09/22 2108 10/10/22 0012 10/10/22 0327  NA 133* 133* 135 135 135  K 5.0 4.5 4.1 3.9 3.8  CL 100 101 105 105 105  CO2 10* 18* 21* 22 22  GLUCOSE 343* 209* 138* 138* 277*  BUN 25* 22* 21* 19 17  CREATININE 1.94* 1.41* 1.20 1.14 1.10  CALCIUM 8.3* 8.2* 8.3* 8.5* 8.2*    GFR Estimated Creatinine Clearance: 67 mL/min (by C-G formula based on SCr of 1.1 mg/dL). Liver Function Tests: Recent Labs  Lab 10/09/22 0735 10/10/22 0012  AST 55* 31  ALT 46* 31  ALKPHOS 136* 93  BILITOT 2.6* 1.3*  PROT 7.6 5.8*  ALBUMIN 4.1 3.1*   No results for input(s): "LIPASE", "AMYLASE" in the last 168 hours. No results for input(s): "AMMONIA" in the last 168 hours. Coagulation profile No results for input(s): "INR", "PROTIME" in the last 168 hours. COVID-19 Labs  No results for input(s): "DDIMER", "FERRITIN", "LDH", "CRP" in the last 72 hours.  Lab Results  Component Value Date   SARSCOV2NAA NEGATIVE 03/27/2020    CBC: Recent Labs  Lab 10/09/22 0735 10/09/22 0828 10/10/22 0012  WBC 8.9  --  6.0  HGB 11.5* 12.9* 10.2*  HCT 36.0* 38.0* 29.3*  MCV 104.7*  --  99.3  PLT 234  --  167   Cardiac Enzymes: No results for input(s): "CKTOTAL", "CKMB", "CKMBINDEX", "TROPONINI" in the last 168 hours. BNP (last 3 results) No results for input(s): "PROBNP" in the last 8760  hours. CBG: Recent Labs  Lab 10/09/22 2347 10/10/22 0113 10/10/22 0221 10/10/22 0339 10/10/22 0450  GLUCAP 141* 126* 108* 297* 295*   D-Dimer: No results for input(s): "DDIMER" in the last 72 hours. Hgb A1c: Recent Labs    10/09/22 1322  HGBA1C 9.4*   Lipid Profile: No results for input(s): "CHOL", "HDL", "LDLCALC", "TRIG", "CHOLHDL", "LDLDIRECT" in the last 72 hours. Thyroid function studies: No results for input(s): "TSH", "T4TOTAL", "T3FREE", "THYROIDAB" in the last 72 hours.  Invalid input(s): "FREET3" Anemia work up: No results for input(s): "VITAMINB12", "FOLATE", "FERRITIN", "TIBC", "IRON", "RETICCTPCT" in the last 72 hours. Sepsis Labs: Recent Labs  Lab 10/09/22 0735 10/10/22 0012  WBC 8.9 6.0   Microbiology No results found for this or any previous visit (from the past 240 hour(s)).   Medications:    Chlorhexidine Gluconate Cloth  6 each Topical Daily    dolutegravir-lamiVUDine  1 tablet Oral Daily   enoxaparin (LOVENOX) injection  40 mg Subcutaneous Daily   folic acid  1 mg Oral Daily   insulin aspart  0-5 Units Subcutaneous QHS   insulin aspart  0-9 Units Subcutaneous TID WC   multivitamin with minerals  1 tablet Oral Daily   pantoprazole (PROTONIX) IV  40 mg Intravenous Daily   sodium chloride flush  3 mL Intravenous Q12H   thiamine  100 mg Oral Daily   Or   thiamine  100 mg Intravenous Daily   Continuous Infusions:  sodium chloride     lactated ringers 125 mL/hr at 10/09/22 1357      LOS: 1 day   [name]  [facility]  10/10/2022, 7:34 AM

## 2022-10-10 NOTE — TOC Benefit Eligibility Note (Signed)
Pharmacy Patient Advocate Encounter  Insurance verification completed.    The patient is insured through [employer] for [employer] and the current 30 day co-pay is $35.00.  Ran test claim for [employer] and the current 30 day co-pay is $35.00.  This test claim was processed through [facility]- copay amounts may vary at other pharmacies due to pharmacy/plan contracts, or as the patient moves through the different stages of their insurance plan.    Roland Earl, CPHT Pharmacy Patient Advocate Specialist [facility] Patient Advocate Team Direct Number: [phone]  Fax: [phone]

## 2022-10-11 DIAGNOSIS — E1010 Type 1 diabetes mellitus with ketoacidosis without coma: Secondary | ICD-10-CM | POA: Diagnosis not present

## 2022-10-11 DIAGNOSIS — N179 Acute kidney failure, unspecified: Secondary | ICD-10-CM | POA: Diagnosis not present

## 2022-10-11 LAB — BASIC METABOLIC PANEL
Anion gap: 8 (ref 5–15)
BUN: 19 mg/dL (ref 6–20)
CO2: 24 mmol/L (ref 22–32)
Calcium: 8.7 mg/dL — ABNORMAL LOW (ref 8.9–10.3)
Chloride: 104 mmol/L (ref 98–111)
Creatinine, Ser: 1.19 mg/dL (ref 0.61–1.24)
GFR, Estimated: >60 mL/min (ref >60)
Glucose, Bld: 285 mg/dL — ABNORMAL HIGH (ref 70–99)
Potassium: 3.7 mmol/L (ref 3.5–5.1)
Sodium: 136 mmol/L (ref 135–145)

## 2022-10-11 LAB — GLUCOSE, CAPILLARY: Glucose-Capillary: 177 mg/dL — ABNORMAL HIGH (ref 70–99)

## 2022-10-11 MED ORDER — INSULIN DETEMIR 100 UNIT/ML ~~LOC~~ SOLN
15.0000 [IU] | Freq: Two times a day (BID) | SUBCUTANEOUS | Status: DC
  Administered 2022-10-11: 15 [IU] via SUBCUTANEOUS
  Filled 2022-10-11 (×2): qty 0.15

## 2022-10-11 MED ORDER — INSULIN GLARGINE (2 UNIT DIAL) 300 UNIT/ML ~~LOC~~ SOPN: 30.0000 [IU] | PEN_INJECTOR | Freq: Every day | SUBCUTANEOUS | 4 refills | Status: DC

## 2022-10-11 NOTE — Discharge Summary (Signed)
Physician Discharge Summary  Carl Reed ZOX:[id number] DOB: Oct 20, 1965 DOA: 10/09/2022  PCP: Daylene Katayama, PA  Admit date: 10/09/2022 Discharge date: 10/11/2022  Admitted From: Home Disposition:  Home  Recommendations for Outpatient Follow-up:  Follow up with PCP in 1-2 weeks   Home Health:No Equipment/Devices:None  Discharge Condition:Stable CODE STATUS:Full Diet recommendation: Heart Healthy  Brief/Interim Summary: 57 y.o. male past medical history of controlled diabetes mellitus type 1, HIV suspect gastroparesis admitted to the hospital for recurrent vomiting in the setting of DKA, blood sugar at home was over 400 he had a follow-up appointment with GI for possible gastroparesis in the ED blood glucose was above 500 creatinine 1.9 with started on IV insulin   Discharge Diagnoses:  Principal Problem:   DKA (diabetic ketoacidosis) (HCC) Active Problems:   HIV (human immunodeficiency virus infection) (HCC)   Abnormal LFTs   AKI (acute kidney injury) (HCC)  DKA/poorly controlled diabetes mellitus type 1: Likely due to noncompliance he was started on IV insulin drip and IV fluids anion gap corrected bicarb greater than 20. His long-acting was increased to 30 units with meal coverage. Which she will continue as an outpatient. Metformin was discontinued.  HIV: No change made to his medication.  Normal LFTs: Likely due to DKA, acute otitis panel is negative elevation in LFTs resolved.  Acute kidney injury: Likely prerenal azotemia resolved with IV fluids.  Discharge Instructions  Discharge Instructions     Diet - low sodium heart healthy   Complete by: As directed    Increase activity slowly   Complete by: As directed       Allergies as of 10/11/2022   No Known Allergies      Medication List     STOP taking these medications    meloxicam 15 MG tablet Commonly known as: MOBIC   metFORMIN 500 MG tablet Commonly known as: GLUCOPHAGE       TAKE  these medications    cyclobenzaprine 10 MG tablet Commonly known as: FLEXERIL Take 1 tablet by mouth daily as needed for muscle spasms.   Dovato 50-300 MG tablet Generic drug: dolutegravir-lamiVUDine TAKE 1 TABLET BY MOUTH ONCE DAILY. TAKE 2 HOURS BEFORE MULTIVITAMIN, SUCRALAFATE, AND FEEDING SUPPLEMENT What changed: See the new instructions.   famotidine 20 MG tablet Commonly known as: PEPCID Take 1 tablet by mouth once daily   gabapentin 300 MG capsule Commonly known as: NEURONTIN Take 600 mg by mouth 3 (three) times daily.   insulin glargine (2 Unit Dial) 300 UNIT/ML Solostar Pen Commonly known as: TOUJEO MAX Inject 30 Units into the skin daily. What changed: how much to take   insulin lispro 100 UNIT/ML injection Commonly known as: HUMALOG Continue as previously prescribed by [facility] endocrinologist depending on the scale What changed:  how much to take how to take this when to take this additional instructions   lisinopril 5 MG tablet Commonly known as: ZESTRIL Take 5 mg by mouth daily.   Multi-Vitamin tablet Take 1 tablet by mouth daily.   nicotine 14 mg/24hr patch Commonly known as: NICODERM CQ - dosed in mg/24 hours Place 14 mg onto the skin daily.   omeprazole 40 MG capsule Commonly known as: PRILOSEC Take 40 mg by mouth daily.   tadalafil 20 MG tablet Commonly known as: CIALIS Take 20 mg by mouth daily as needed for erectile dysfunction.   traZODone 50 MG tablet Commonly known as: DESYREL Take 50 mg by mouth at bedtime as needed for sleep.  No Known Allergies  Consultations: None   Procedures/Studies: DG Sacrum/Coccyx  Result Date: 10/09/2022 CLINICAL DATA:  Syncope 1 week ago, pain in sacrum/coccyx area. EXAM: SACRUM AND COCCYX - 2+ VIEW COMPARISON:  None Available. FINDINGS: There is no definite acute fracture of the sacrum/coccyx. Femoroacetabular alignment is normal. The SI joints and symphysis pubis are intact. There is  moderate left and mild right femoroacetabular joint space narrowing sclerosis of the femoral heads, left significantly worse than right, is consistent with avascular necrosis as seen on prior MRI. IMPRESSION: 1. No definite acute finding in the sacrum/coccyx. 2. Avascular necrosis of the femoral heads, left worse than right, and left worse than right femoroacetabular joint space narrowing. Electronically Signed   By: Lesia Hausen M.D.   On: 10/09/2022 08:27   DG Chest 2 View  Result Date: 10/09/2022 CLINICAL DATA:  Syncope 1 week ago, shortness of breath EXAM: CHEST - 2 VIEW COMPARISON:  Chest radiograph 02/08/2022 FINDINGS: The cardiomediastinal silhouette is normal There is no focal consolidation or pulmonary edema. There is no pleural effusion or pneumothorax There is no acute osseous abnormality. IMPRESSION: No radiographic evidence of acute cardiopulmonary process. Electronically Signed   By: Lesia Hausen M.D.   On: 10/09/2022 08:25   CT Head Wo Contrast  Result Date: 10/09/2022 CLINICAL DATA:  Head trauma, moderate-severe EXAM: CT HEAD WITHOUT CONTRAST TECHNIQUE: Contiguous axial images were obtained from the base of the skull through the vertex without intravenous contrast. RADIATION DOSE REDUCTION: This exam was performed according to the departmental dose-optimization program which includes automated exposure control, adjustment of the mA and/or kV according to patient size and/or use of iterative reconstruction technique. COMPARISON:  None Available. FINDINGS: Brain: No evidence of acute infarction, hemorrhage, hydrocephalus, extra-axial collection or mass lesion/mass effect. Sequela of mild chronic microvascular ischemic change Vascular: No hyperdense vessel or unexpected calcification. Skull: Normal. Negative for fracture or focal lesion. Sinuses/Orbits: No middle ear or mastoid effusion. Paranasal sinuses are clear. Orbits are unremarkable. Other: None. IMPRESSION: No CT evidence of intracranial  injury. Electronically Signed   By: Lorenza Cambridge M.D.   On: 10/09/2022 08:20   (Echo, Carotid, EGD, Colonoscopy, ERCP)    Subjective: No complaints  Discharge Exam: Vitals:   10/10/22 2007 10/11/22 0420  BP: 127/71 130/84  Pulse: 82 78  Resp: 18 16  Temp: 98.5 F (36.9 C) 98.4 F (36.9 C)  SpO2: 100% 100%   Vitals:   10/10/22 0900 10/10/22 1024 10/10/22 2007 10/11/22 0420  BP: (!) 208/90 135/83 127/71 130/84  Pulse: 92 79 82 78  Resp: 16 16 18 16   Temp:  98.1 F (36.7 C) 98.5 F (36.9 C) 98.4 F (36.9 C)  TempSrc:  Oral  Oral  SpO2: 100% 100% 100% 100%  Weight:      Height:        General: Pt is alert, awake, not in acute distress Cardiovascular: RRR, S1/S2 +, no rubs, no gallops Respiratory: CTA bilaterally, no wheezing, no rhonchi Abdominal: Soft, NT, ND, bowel sounds + Extremities: no edema, no cyanosis    The results of significant diagnostics from this hospitalization (including imaging, microbiology, ancillary and laboratory) are listed below for reference.     Microbiology: No results found for this or any previous visit (from the past 240 hour(s)).   Labs: BNP (last 3 results) No results for input(s): "BNP" in the last 8760 hours. Basic Metabolic Panel: Recent Labs  Lab 10/09/22 1653 10/09/22 2108 10/10/22 0012 10/10/22 0327 10/11/22 1610  NA 133* 135 135 135 136  K 4.5 4.1 3.9 3.8 3.7  CL 101 105 105 105 104  CO2 18* 21* 22 22 24   GLUCOSE 209* 138* 138* 277* 285*  BUN 22* 21* 19 17 19   CREATININE 1.41* 1.20 1.14 1.10 1.19  CALCIUM 8.2* 8.3* 8.5* 8.2* 8.7*   Liver Function Tests: Recent Labs  Lab 10/09/22 0735 10/10/22 0012  AST 55* 31  ALT 46* 31  ALKPHOS 136* 93  BILITOT 2.6* 1.3*  PROT 7.6 5.8*  ALBUMIN 4.1 3.1*   No results for input(s): "LIPASE", "AMYLASE" in the last 168 hours. No results for input(s): "AMMONIA" in the last 168 hours. CBC: Recent Labs  Lab 10/09/22 0735 10/09/22 0828 10/10/22 0012  WBC 8.9  --  6.0   HGB 11.5* 12.9* 10.2*  HCT 36.0* 38.0* 29.3*  MCV 104.7*  --  99.3  PLT 234  --  167   Cardiac Enzymes: No results for input(s): "CKTOTAL", "CKMB", "CKMBINDEX", "TROPONINI" in the last 168 hours. BNP: Invalid input(s): "POCBNP" CBG: Recent Labs  Lab 10/10/22 0755 10/10/22 1159 10/10/22 1620 10/10/22 2110 10/11/22 0756  GLUCAP 119* 70 151* 133* 177*   D-Dimer No results for input(s): "DDIMER" in the last 72 hours. Hgb A1c Recent Labs    10/09/22 1322  HGBA1C 9.4*   Lipid Profile No results for input(s): "CHOL", "HDL", "LDLCALC", "TRIG", "CHOLHDL", "LDLDIRECT" in the last 72 hours. Thyroid function studies No results for input(s): "TSH", "T4TOTAL", "T3FREE", "THYROIDAB" in the last 72 hours.  Invalid input(s): "FREET3" Anemia work up No results for input(s): "VITAMINB12", "FOLATE", "FERRITIN", "TIBC", "IRON", "RETICCTPCT" in the last 72 hours. Urinalysis    Component Value Date/Time   COLORURINE YELLOW 10/09/2022 0737   APPEARANCEUR CLEAR 10/09/2022 0737   LABSPEC 1.020 10/09/2022 0737   PHURINE 5.5 10/09/2022 0737   GLUCOSEU >=500 (A) 10/09/2022 0737   HGBUR TRACE (A) 10/09/2022 0737   BILIRUBINUR SMALL (A) 10/09/2022 0737   KETONESUR >=80 (A) 10/09/2022 0737   PROTEINUR 100 (A) 10/09/2022 0737   NITRITE NEGATIVE 10/09/2022 0737   LEUKOCYTESUR NEGATIVE 10/09/2022 0737   Sepsis Labs Recent Labs  Lab 10/09/22 0735 10/10/22 0012  WBC 8.9 6.0   Microbiology No results found for this or any previous visit (from the past 240 hour(s)).  SIGNED:   [name], MD  [facility] 10/11/2022, 9:59 AM Pager   If 7PM-7AM, please contact night-coverage [email or web address] Password [email or web address]

## 2022-10-15 ENCOUNTER — Encounter (HOSPITAL_BASED_OUTPATIENT_CLINIC_OR_DEPARTMENT_OTHER): Payer: Self-pay

## 2022-10-15 ENCOUNTER — Emergency Department (HOSPITAL_BASED_OUTPATIENT_CLINIC_OR_DEPARTMENT_OTHER): Payer: BC Managed Care – PPO

## 2022-10-15 ENCOUNTER — Other Ambulatory Visit: Payer: Self-pay

## 2022-10-15 ENCOUNTER — Emergency Department (HOSPITAL_BASED_OUTPATIENT_CLINIC_OR_DEPARTMENT_OTHER)
Admission: EM | Admit: 2022-10-15 | Discharge: 2022-10-15 | Disposition: A | Discharge status: Home / Self Care | Payer: BC Managed Care – PPO | Attending: Emergency Medicine | Admitting: Emergency Medicine

## 2022-10-15 DIAGNOSIS — M87059 Idiopathic aseptic necrosis of unspecified femur: Secondary | ICD-10-CM | POA: Insufficient documentation

## 2022-10-15 DIAGNOSIS — D72819 Decreased white blood cell count, unspecified: Secondary | ICD-10-CM | POA: Insufficient documentation

## 2022-10-15 DIAGNOSIS — Z79899 Other long term (current) drug therapy: Secondary | ICD-10-CM | POA: Insufficient documentation

## 2022-10-15 DIAGNOSIS — I10 Essential (primary) hypertension: Secondary | ICD-10-CM | POA: Diagnosis not present

## 2022-10-15 DIAGNOSIS — M7989 Other specified soft tissue disorders: Secondary | ICD-10-CM | POA: Diagnosis present

## 2022-10-15 DIAGNOSIS — Z794 Long term (current) use of insulin: Secondary | ICD-10-CM | POA: Insufficient documentation

## 2022-10-15 DIAGNOSIS — R600 Localized edema: Secondary | ICD-10-CM | POA: Insufficient documentation

## 2022-10-15 DIAGNOSIS — Z21 Asymptomatic human immunodeficiency virus [HIV] infection status: Secondary | ICD-10-CM | POA: Diagnosis not present

## 2022-10-15 DIAGNOSIS — R7989 Other specified abnormal findings of blood chemistry: Secondary | ICD-10-CM | POA: Insufficient documentation

## 2022-10-15 DIAGNOSIS — E119 Type 2 diabetes mellitus without complications: Secondary | ICD-10-CM | POA: Insufficient documentation

## 2022-10-15 LAB — COMPREHENSIVE METABOLIC PANEL
ALT: 105 U/L — ABNORMAL HIGH (ref 0–44)
AST: 137 U/L — ABNORMAL HIGH (ref 15–41)
Albumin: 3.2 g/dL — ABNORMAL LOW (ref 3.5–5.0)
Alkaline Phosphatase: 98 U/L (ref 38–126)
Anion gap: 10 (ref 5–15)
BUN: 18 mg/dL (ref 6–20)
CO2: 23 mmol/L (ref 22–32)
Calcium: 9.2 mg/dL (ref 8.9–10.3)
Chloride: 106 mmol/L (ref 98–111)
Creatinine, Ser: 1.02 mg/dL (ref 0.61–1.24)
GFR, Estimated: >60 mL/min (ref >60)
Glucose, Bld: 74 mg/dL (ref 70–99)
Potassium: 3.1 mmol/L — ABNORMAL LOW (ref 3.5–5.1)
Sodium: 139 mmol/L (ref 135–145)
Total Bilirubin: 0.3 mg/dL (ref 0.3–1.2)
Total Protein: 6.3 g/dL — ABNORMAL LOW (ref 6.5–8.1)

## 2022-10-15 LAB — CBC WITH DIFFERENTIAL/PLATELET
Abs Immature Granulocytes: 0.01 10*3/uL (ref 0.00–0.07)
Basophils Absolute: 0 10*3/uL (ref 0.0–0.1)
Basophils Relative: 1 %
Eosinophils Absolute: 0.2 10*3/uL (ref 0.0–0.5)
Eosinophils Relative: 7 %
HCT: 29.1 % — ABNORMAL LOW (ref 39.0–52.0)
Hemoglobin: 9.8 g/dL — ABNORMAL LOW (ref 13.0–17.0)
Immature Granulocytes: 0 %
Lymphocytes Relative: 35 %
Lymphs Abs: 1.2 10*3/uL (ref 0.7–4.0)
MCH: 34 pg (ref 26.0–34.0)
MCHC: 33.7 g/dL (ref 30.0–36.0)
MCV: 101 fL — ABNORMAL HIGH (ref 80.0–100.0)
Monocytes Absolute: 0.5 10*3/uL (ref 0.1–1.0)
Monocytes Relative: 14 %
Neutro Abs: 1.5 10*3/uL — ABNORMAL LOW (ref 1.7–7.7)
Neutrophils Relative %: 43 %
Platelets: 207 10*3/uL (ref 150–400)
RBC: 2.88 MIL/uL — ABNORMAL LOW (ref 4.22–5.81)
RDW: 13.3 % (ref 11.5–15.5)
WBC: 3.5 10*3/uL — ABNORMAL LOW (ref 4.0–10.5)
nRBC: 0 % (ref 0.0–0.2)

## 2022-10-15 LAB — ETHANOL: Alcohol, Ethyl (B): 85 mg/dL — ABNORMAL HIGH (ref <10)

## 2022-10-15 LAB — HEPATITIS PANEL, ACUTE
HCV Ab: NONREACTIVE
Hep A IgM: NONREACTIVE
Hep B C IgM: NONREACTIVE
Hepatitis B Surface Ag: NONREACTIVE

## 2022-10-15 LAB — MAGNESIUM: Magnesium: 1.2 mg/dL — ABNORMAL LOW (ref 1.7–2.4)

## 2022-10-15 LAB — BRAIN NATRIURETIC PEPTIDE: B Natriuretic Peptide: 81.7 pg/mL (ref 0.0–100.0)

## 2022-10-15 LAB — ACETAMINOPHEN LEVEL: Acetaminophen (Tylenol), Serum: <10 ug/mL — ABNORMAL LOW (ref 10–30)

## 2022-10-15 MED ORDER — MAGNESIUM SULFATE 2 GM/50ML IV SOLN
2.0000 g | Freq: Once | INTRAVENOUS | Status: AC
  Administered 2022-10-15: 2 g via INTRAVENOUS
  Filled 2022-10-15: qty 50

## 2022-10-15 MED ORDER — IOHEXOL 300 MG/ML  SOLN
80.0000 mL | Freq: Once | INTRAMUSCULAR | Status: AC | PRN
  Administered 2022-10-15: 80 mL via INTRAVENOUS

## 2022-10-15 MED ORDER — POTASSIUM CHLORIDE CRYS ER 20 MEQ PO TBCR
40.0000 meq | EXTENDED_RELEASE_TABLET | Freq: Once | ORAL | Status: AC
  Administered 2022-10-15: 40 meq via ORAL
  Filled 2022-10-15: qty 2

## 2022-10-15 NOTE — ED Notes (Signed)
Pt's blood sugar monitor is alerting him that glucose is 68; EDP notified; provided pt with apple juice and graham crackers

## 2022-10-15 NOTE — ED Provider Notes (Signed)
[facility] Provider Note   CSN: [id number] Arrival date & time: 10/15/22  4010     History  Chief Complaint  Patient presents with   Leg Swelling    Kenyatta Hiegel is a 57 y.o. male with a past medical history significant for diabetes, hypertension, and HIV who presents to the ED due to bilateral lower extremity edema that has been present for the past few days.  Patient was recently admitted to hospital on 7/8 to 7/10 for DKA.  Patient denies associated chest pain or shortness of breath.  Lower extremity edema started in his left leg and then transitioned to his right lower extremity.  Edema worse in his bilateral feet.  No chest pain or shortness of breath.  No history of blood clots.  No recent surgeries or travel.  Did have a left metatarsal fracture roughly 3 months ago.  No further injuries.  Placed on Flexeril after his admission however, no other medications.  Patient is not currently on amlodipine.  No history of CHF.  History obtained from patient and past medical records. No interpreter used during encounter.       Home Medications Prior to Admission medications   Medication Sig Start Date End Date Taking? Authorizing Provider  cyclobenzaprine (FLEXERIL) 10 MG tablet Take 1 tablet by mouth daily as needed for muscle spasms. 07/25/22   [provider]  DOVATO 50-300 MG tablet TAKE 1 TABLET BY MOUTH ONCE DAILY. TAKE 2 HOURS BEFORE MULTIVITAMIN, SUCRALAFATE, AND FEEDING SUPPLEMENT Patient taking differently: Take 1 tablet by mouth daily. 06/15/22   [name], MD  famotidine (PEPCID) 20 MG tablet Take 1 tablet by mouth once daily 03/15/22   [name], MD  gabapentin (NEURONTIN) 300 MG capsule Take 600 mg by mouth 3 (three) times daily.    [provider]  insulin glargine, 2 Unit Dial, (TOUJEO MAX) 300 UNIT/ML Solostar Pen Inject 30 Units into the skin daily. 10/11/22   [name], MD  insulin  lispro (HUMALOG) 100 UNIT/ML injection Continue as previously prescribed by [facility] endocrinologist depending on the scale Patient taking differently: Inject 5-20 Units into the skin 3 (three) times daily with meals. Continue as previously prescribed by [facility] endocrinologist depending on the scale  70-90 : 5/breakfast 3/lunch 7/supper 91-130: 9/breakfast 5/lunch 12/supper 131-150: 10/breakfast 6/lunch 13/supper 151-200: 11/breakfast 7/lunch 14/supper 201-250: 12/breakfast 8/lunch 15/supper 251-300: 13/breakfast 9/lunch 16/supper 301-350: 14/breakfast 10/lunch 17/supper 351-400: 15/breakfast 11/lunch 18/supper 401-450: 16/breakfast 12/lunch 19/supper >450: 17/breakfast 13/lunch 20/supper 03/28/20 02/09/23  [name], MD  lisinopril (ZESTRIL) 5 MG tablet Take 5 mg by mouth daily.    [provider]  Multiple Vitamin (MULTI-VITAMIN) tablet Take 1 tablet by mouth daily. 08/21/18   [provider]  nicotine (NICODERM CQ - DOSED IN MG/24 HOURS) 14 mg/24hr patch Place 14 mg onto the skin daily.    [provider]  omeprazole (PRILOSEC) 40 MG capsule Take 40 mg by mouth daily. 06/06/22   [provider]  tadalafil (CIALIS) 20 MG tablet Take 20 mg by mouth daily as needed for erectile dysfunction. 12/14/21   [provider]  traZODone (DESYREL) 50 MG tablet Take 50 mg by mouth at bedtime as needed for sleep. 11/25/20   [provider]      Allergies    Patient has no known allergies.    Review of Systems   Review of Systems  Constitutional:  Negative for fever.  Respiratory:  Negative  for shortness of breath.   Cardiovascular:  Positive for leg swelling. Negative for chest pain.    Physical Exam Updated Vital Signs BP (!) 154/83   Pulse 95   Temp 97.7 F (36.5 C) (Oral)   Resp 17   Ht 5\' 11"  (1.803 m)   Wt 62 kg   SpO2 100%   BMI 19.06 kg/m  Physical Exam Vitals and nursing note reviewed.  Constitutional:       General: He is not in acute distress.    Appearance: He is not ill-appearing.  HENT:     Head: Normocephalic.  Eyes:     Pupils: Pupils are equal, round, and reactive to light.  Cardiovascular:     Rate and Rhythm: Normal rate and regular rhythm.     Pulses: Normal pulses.     Heart sounds: Normal heart sounds. No murmur heard.    No friction rub. No gallop.  Pulmonary:     Effort: Pulmonary effort is normal.     Breath sounds: Normal breath sounds.  Abdominal:     General: Abdomen is flat. There is no distension.     Palpations: Abdomen is soft.     Tenderness: There is no abdominal tenderness. There is no guarding or rebound.  Musculoskeletal:        General: Normal range of motion.     Cervical back: Neck supple.     Comments: Edema to bilateral ankles and feet. No erythema. No calf tenderness  Skin:    General: Skin is warm and dry.  Neurological:     General: No focal deficit present.     Mental Status: He is alert.  Psychiatric:        Mood and Affect: Mood normal.        Behavior: Behavior normal.     ED Results / Procedures / Treatments   Labs (all labs ordered are listed, but only abnormal results are displayed) Labs Reviewed  CBC WITH DIFFERENTIAL/PLATELET - Abnormal; Notable for the following components:      Result Value   WBC 3.5 (*)    RBC 2.88 (*)    Hemoglobin 9.8 (*)    HCT 29.1 (*)    MCV 101.0 (*)    Neutro Abs 1.5 (*)    All other components within normal limits  COMPREHENSIVE METABOLIC PANEL - Abnormal; Notable for the following components:   Potassium 3.1 (*)    Total Protein 6.3 (*)    Albumin 3.2 (*)    AST 137 (*)    ALT 105 (*)    All other components within normal limits  ETHANOL - Abnormal; Notable for the following components:   Alcohol, Ethyl (B) 85 (*)    All other components within normal limits  ACETAMINOPHEN LEVEL - Abnormal; Notable for the following components:   Acetaminophen (Tylenol), Serum <10 (*)    All other  components within normal limits  MAGNESIUM - Abnormal; Notable for the following components:   Magnesium 1.2 (*)    All other components within normal limits  BRAIN NATRIURETIC PEPTIDE  HEPATITIS PANEL, ACUTE    EKG None  Radiology CT ABDOMEN PELVIS W CONTRAST  Result Date: 10/15/2022 CLINICAL DATA:  Sepsis Abdominal pain, acute, nonlocalized EXAM: CT ABDOMEN AND PELVIS WITH CONTRAST TECHNIQUE: Multidetector CT imaging of the abdomen and pelvis was performed using the standard protocol following bolus administration of intravenous contrast. RADIATION DOSE REDUCTION: This exam was performed according to the departmental dose-optimization program which includes automated  exposure control, adjustment of the mA and/or kV according to patient size and/or use of iterative reconstruction technique. CONTRAST:  80mL OMNIPAQUE IOHEXOL 300 MG/ML  SOLN COMPARISON:  X-ray 03/27/2020. FINDINGS: Lower chest: Included lung bases are clear.  Heart size is normal. Hepatobiliary: Diffusely decreased attenuation of the hepatic parenchyma. No focal liver lesion is identified. Unremarkable gallbladder. No hyperdense gallstone. No biliary dilatation. Pancreas: Unremarkable. No pancreatic ductal dilatation or surrounding inflammatory changes. Spleen: Normal in size without focal abnormality. Adrenals/Urinary Tract: Unremarkable adrenal glands. Kidneys enhance symmetrically without focal lesion, stone, or hydronephrosis. Ureters are nondilated. Urinary bladder appears unremarkable for the degree of distention. Stomach/Bowel: Stomach is within normal limits. Appendix appears normal (series 5, image 61). No evidence of bowel wall thickening, distention, or inflammatory changes. Moderate volume stool throughout the colon. Vascular/Lymphatic: Aortic atherosclerosis. No enlarged abdominal or pelvic lymph nodes. Reproductive: Prostate is unremarkable. Other: No free fluid. No abdominopelvic fluid collection. No pneumoperitoneum. No  abdominal wall hernia. Musculoskeletal: Extensive changes of avascular necrosis of the bilateral femoral heads with articular surface collapse of the left femoral head. Right femoral head contour is maintained without collapse. 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 IMPRESSION: 1. No acute abdominopelvic findings. 2. Moderate volume stool throughout the colon. Correlate for constipation. 3. Hepatic steatosis. 4. 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 5. Aortic atherosclerosis (ICD10-I70.0). Electronically Signed   By: Duanne Guess D.O.   On: 10/15/2022 13:16   DG Chest Portable 1 View  Result Date: 10/15/2022 CLINICAL DATA:  Lower extremity edema. EXAM: PORTABLE CHEST 1 VIEW COMPARISON:  October 09, 2022 FINDINGS: Cardiomediastinal silhouette is normal. Mediastinal contours appear intact. There is no evidence of focal airspace consolidation, pleural effusion or pneumothorax. Osseous structures are without acute abnormality. Soft tissues are grossly normal. IMPRESSION: No active disease. Electronically Signed   By: Ted Mcalpine M.D.   On: 10/15/2022 12:11   US Venous Img Lower Bilateral (DVT)  Result Date: 10/15/2022 CLINICAL DATA:  Bilateral lower extremity swelling for the past week. History of left foot fracture 3 months ago. Evaluate for DVT. EXAM: BILATERAL LOWER EXTREMITY VENOUS DOPPLER ULTRASOUND TECHNIQUE: Gray-scale sonography with graded compression, as well as color Doppler and duplex ultrasound were performed to evaluate the lower extremity deep venous systems from the level of the common femoral vein and including the common femoral, femoral, profunda femoral, popliteal and calf veins including the posterior tibial, peroneal and gastrocnemius veins when visible. The superficial great saphenous vein was also interrogated. Spectral Doppler was utilized to evaluate flow at rest and with distal augmentation  maneuvers in the common femoral, femoral and popliteal veins. COMPARISON:  None Available. FINDINGS: RIGHT LOWER EXTREMITY Common Femoral Vein: No evidence of thrombus. Normal compressibility, respiratory phasicity and response to augmentation. Saphenofemoral Junction: No evidence of thrombus. Normal compressibility and flow on color Doppler imaging. Profunda Femoral Vein: No evidence of thrombus. Normal compressibility and flow on color Doppler imaging. Femoral Vein: No evidence of thrombus. Normal compressibility, respiratory phasicity and response to augmentation. Popliteal Vein: No evidence of thrombus. Normal compressibility, respiratory phasicity and response to augmentation. Calf Veins: No evidence of thrombus. Normal compressibility and flow on color Doppler imaging. Superficial Great Saphenous Vein: No evidence of thrombus. Normal compressibility. Other Findings: Note is made of an approximately 2.1 x 1.7 x 1.0 cm complex fluid collection with the right popliteal fossa compatible with a Baker's cyst. There is a moderate to large amount of subcutaneous  edema at the level of the right lower leg and ankle. LEFT LOWER EXTREMITY Common Femoral Vein: No evidence of thrombus. Normal compressibility, respiratory phasicity and response to augmentation. Saphenofemoral Junction: No evidence of thrombus. Normal compressibility and flow on color Doppler imaging. Profunda Femoral Vein: No evidence of thrombus. Normal compressibility and flow on color Doppler imaging. Femoral Vein: No evidence of thrombus. Normal compressibility, respiratory phasicity and response to augmentation. Popliteal Vein: No evidence of thrombus. Normal compressibility, respiratory phasicity and response to augmentation. Calf Veins: No evidence of thrombus. Normal compressibility and flow on color Doppler imaging. Superficial Great Saphenous Vein: No evidence of thrombus. Normal compressibility. Other Findings: Note is made of a benign-appearing  non pathologically enlarged left inguinal lymph nodes, reactive in etiology. Note is made of a 2.0 x 1.4 x 0.7 cm fluid collection with the left popliteal fossa compatible with a Baker's cyst. There is a moderate-to-large amount of subcutaneous edema at the level of the left calf and lower leg. IMPRESSION: No evidence of DVT within either lower extremity. Electronically Signed   By: Simonne Come M.D.   On: 10/15/2022 11:42    Procedures Procedures    Medications Ordered in ED Medications  magnesium sulfate IVPB 2 g 50 mL (2 g Intravenous New Bag/Given 10/15/22 1451)  potassium chloride SA (KLOR-CON M) CR tablet 40 mEq (40 mEq Oral Given 10/15/22 1322)  iohexol (OMNIPAQUE) 300 MG/ML solution 80 mL (80 mLs Intravenous Contrast Given 10/15/22 1253)    ED Course/ Medical Decision Making/ A&P Clinical Course as of 10/15/22 1452  Sun Oct 15, 2022  1240 Potassium(!): 3.1 [CA]    Clinical Course User Index [CA] Mannie Stabile, PA-C                             Medical Decision Making Amount and/or Complexity of Data Reviewed Independent Historian: spouse Labs: ordered. Decision-making details documented in ED Course. Radiology: ordered and independent interpretation performed. Decision-making details documented in ED Course. ECG/medicine tests: ordered and independent interpretation performed. Decision-making details documented in ED Course.  Risk Prescription drug management.   This patient presents to the ED for concern of lower extremity edema, this involves an extensive number of treatment options, and is a complaint that carries with it a high risk of complications and morbidity.  The differential diagnosis includes CHF, DVT, medication side effect, dependent edema, etc  57 year old male presents to the ED due to bilateral lower extremity edema that has been present for the past few days.  Recent admission on 7/8 to 7/10 due to DKA.  Patient states lower extremity edema started in  the left lower extremity and then transitioned to the right.  No history of blood clots.  No chest pain or shortness of breath.  Upon arrival, vitals all within normal limits.  Patient is afebrile, not tachycardic or hypoxic.  Patient in no acute distress.  Edema to bilateral ankles and feet.  No calf tenderness.  Negative Homan sign.  Routine labs ordered.  Ultrasound rule out DVT.  BNP to rule out CHF. Edema could be secondary to aggressive fluid resuscitation during admission for DKA.  CBC significant for leukopenia at 3.5.  Hemoglobin at 9.8 which appears to be around patient's baseline.  BNP normal.  Low suspicion for CHF.  CMP significant for hypokalemia at 3.1.  Transaminitis with AST at 137 and ALT at 105.  Hepatitis panel and acetaminophen level ordered.  CT abdomen ordered  because unfortunately ultrasound tech is not here currently to get a right upper quadrant ultrasound.  Patient admits to drinking alcohol daily.  Transaminitis likely secondary to alcohol abuse.  Ethanol elevated 85.  Ultrasound negative for DVT.  Does demonstrate a Baker's cyst on LLE.  Chest x-ray personally reviewed and interpreted which is negative for signs of fluid overload.  Normal chest x-ray.  Magnesium at 1.2.  Magnesium repleted.  Acetaminophen levels within normal limits. CT abdomen personally reviewed which is negative for any acute abnormalities.  Does demonstrate moderate volume of stool.  Patient had a bowel movement here in the ED.  Also demonstrates chronic avascular necrosis.  Patient made aware of incidental finding.  suspect lower extremity edema likely multifactorial.  Suspect left lower extremity edema secondary to Baker's cyst and possible aggressive fluid resuscitation during his recent admission for DKA.  No evidence of CHF or DVT.  Patient stable for discharge. Strict ED precautions discussed with patient. Patient states understanding and agrees to plan. Patient discharged home in no acute distress and  stable vitals  Lives at home Hx DM and HIV       Final Clinical Impression(s) / ED Diagnoses Final diagnoses:  Bilateral lower extremity edema  Elevated LFTs  Avascular necrosis of bone of hip, unspecified laterality [facility])    Rx / DC Orders ED Discharge Orders     None         Mannie Stabile, PA-C 10/15/22 1454    Tanda Rockers A, DO 10/17/22 0421

## 2022-10-15 NOTE — Discharge Instructions (Addendum)
It was a pleasure taking care of you today.  As discussed, your labs are reassuring.  Your liver enzymes are slightly elevated likely secondary to your alcohol use.  Please have your liver enzymes rechecked in a few days.  Your CT scan showed you are also constipated.  Take over-the-counter MiraLAX.  It also showed chronic avascular necrosis of your hips.  Please follow-up with PCP for further evaluation.  I suspect your lower extremity swelling is from the IV fluids you were given in the hospital and your Baker's cyst on your left leg.  Please follow-up with PCP this week for recheck.  Return to the ER for new or worsening symptoms.

## 2022-10-15 NOTE — ED Triage Notes (Addendum)
Pt reports discharge this past Wednesday due to dka. Noticed swelling to left lower extremitiy prior to admission. Thought swelling due to hx of fx left lower ext. Now right LE is swollen as well. Denies pain . No SOB noted

## 2022-10-15 NOTE — ED Notes (Signed)
US at bedside

## 2023-04-26 ENCOUNTER — Other Ambulatory Visit: Payer: Self-pay

## 2023-04-26 ENCOUNTER — Emergency Department (HOSPITAL_COMMUNITY): Payer: BC Managed Care – PPO

## 2023-04-26 ENCOUNTER — Observation Stay (HOSPITAL_COMMUNITY)
Admission: EM | Admit: 2023-04-26 | Discharge: 2023-04-27 | Disposition: A | Discharge status: Home / Self Care | Payer: BC Managed Care – PPO | Attending: Emergency Medicine | Admitting: Emergency Medicine

## 2023-04-26 ENCOUNTER — Encounter (HOSPITAL_COMMUNITY): Payer: Self-pay | Admitting: *Deleted

## 2023-04-26 DIAGNOSIS — I10 Essential (primary) hypertension: Secondary | ICD-10-CM | POA: Diagnosis present

## 2023-04-26 DIAGNOSIS — K219 Gastro-esophageal reflux disease without esophagitis: Secondary | ICD-10-CM | POA: Diagnosis not present

## 2023-04-26 DIAGNOSIS — D539 Nutritional anemia, unspecified: Secondary | ICD-10-CM | POA: Diagnosis present

## 2023-04-26 DIAGNOSIS — E1042 Type 1 diabetes mellitus with diabetic polyneuropathy: Secondary | ICD-10-CM | POA: Insufficient documentation

## 2023-04-26 DIAGNOSIS — E869 Volume depletion, unspecified: Secondary | ICD-10-CM | POA: Diagnosis not present

## 2023-04-26 DIAGNOSIS — F1090 Alcohol use, unspecified, uncomplicated: Secondary | ICD-10-CM | POA: Insufficient documentation

## 2023-04-26 DIAGNOSIS — B20 Human immunodeficiency virus [HIV] disease: Secondary | ICD-10-CM | POA: Insufficient documentation

## 2023-04-26 DIAGNOSIS — D531 Other megaloblastic anemias, not elsewhere classified: Secondary | ICD-10-CM | POA: Diagnosis not present

## 2023-04-26 DIAGNOSIS — F17210 Nicotine dependence, cigarettes, uncomplicated: Secondary | ICD-10-CM | POA: Insufficient documentation

## 2023-04-26 DIAGNOSIS — Z79899 Other long term (current) drug therapy: Secondary | ICD-10-CM | POA: Insufficient documentation

## 2023-04-26 DIAGNOSIS — F1010 Alcohol abuse, uncomplicated: Secondary | ICD-10-CM | POA: Diagnosis present

## 2023-04-26 DIAGNOSIS — Z1152 Encounter for screening for COVID-19: Secondary | ICD-10-CM | POA: Diagnosis not present

## 2023-04-26 DIAGNOSIS — E119 Type 2 diabetes mellitus without complications: Secondary | ICD-10-CM

## 2023-04-26 DIAGNOSIS — N179 Acute kidney failure, unspecified: Secondary | ICD-10-CM | POA: Diagnosis not present

## 2023-04-26 DIAGNOSIS — Z794 Long term (current) use of insulin: Secondary | ICD-10-CM | POA: Insufficient documentation

## 2023-04-26 DIAGNOSIS — R739 Hyperglycemia, unspecified: Secondary | ICD-10-CM | POA: Diagnosis present

## 2023-04-26 DIAGNOSIS — F17200 Nicotine dependence, unspecified, uncomplicated: Secondary | ICD-10-CM | POA: Diagnosis present

## 2023-04-26 DIAGNOSIS — Z21 Asymptomatic human immunodeficiency virus [HIV] infection status: Secondary | ICD-10-CM | POA: Diagnosis present

## 2023-04-26 LAB — URINALYSIS, ROUTINE W REFLEX MICROSCOPIC
Bacteria, UA: NONE SEEN
Bilirubin Urine: NEGATIVE
Glucose, UA: >=500 mg/dL — AB
Ketones, ur: 5 mg/dL — AB
Leukocytes,Ua: NEGATIVE
Nitrite: NEGATIVE
Protein, ur: 30 mg/dL — AB
Specific Gravity, Urine: 1.013 (ref 1.005–1.030)
pH: 6 (ref 5.0–8.0)

## 2023-04-26 LAB — CBC WITH DIFFERENTIAL/PLATELET
Abs Immature Granulocytes: 0.01 10*3/uL (ref 0.00–0.07)
Basophils Absolute: 0 10*3/uL (ref 0.0–0.1)
Basophils Relative: 0 %
Eosinophils Absolute: 0 10*3/uL (ref 0.0–0.5)
Eosinophils Relative: 0 %
HCT: 38.2 % — ABNORMAL LOW (ref 39.0–52.0)
Hemoglobin: 12.7 g/dL — ABNORMAL LOW (ref 13.0–17.0)
Immature Granulocytes: 0 %
Lymphocytes Relative: 33 %
Lymphs Abs: 1.5 10*3/uL (ref 0.7–4.0)
MCH: 33.9 pg (ref 26.0–34.0)
MCHC: 33.2 g/dL (ref 30.0–36.0)
MCV: 101.9 fL — ABNORMAL HIGH (ref 80.0–100.0)
Monocytes Absolute: 0.5 10*3/uL (ref 0.1–1.0)
Monocytes Relative: 12 %
Neutro Abs: 2.6 10*3/uL (ref 1.7–7.7)
Neutrophils Relative %: 55 %
Platelets: 245 10*3/uL (ref 150–400)
RBC: 3.75 MIL/uL — ABNORMAL LOW (ref 4.22–5.81)
RDW: 12.6 % (ref 11.5–15.5)
WBC: 4.6 10*3/uL (ref 4.0–10.5)
nRBC: 0 % (ref 0.0–0.2)

## 2023-04-26 LAB — BLOOD GAS, VENOUS
Acid-base deficit: 0.3 mmol/L (ref 0.0–2.0)
Bicarbonate: 26.4 mmol/L (ref 20.0–28.0)
O2 Saturation: 53.4 %
Patient temperature: 37
pCO2, Ven: 49 mm[Hg] (ref 44–60)
pH, Ven: 7.34 (ref 7.25–7.43)
pO2, Ven: 31 mm[Hg] — CL (ref 32–45)

## 2023-04-26 LAB — COMPREHENSIVE METABOLIC PANEL
ALT: 38 U/L (ref 0–44)
AST: 29 U/L (ref 15–41)
Albumin: 3.9 g/dL (ref 3.5–5.0)
Alkaline Phosphatase: 95 U/L (ref 38–126)
Anion gap: 15 (ref 5–15)
BUN: 28 mg/dL — ABNORMAL HIGH (ref 6–20)
CO2: 20 mmol/L — ABNORMAL LOW (ref 22–32)
Calcium: 10.1 mg/dL (ref 8.9–10.3)
Chloride: 98 mmol/L (ref 98–111)
Creatinine, Ser: 1.68 mg/dL — ABNORMAL HIGH (ref 0.61–1.24)
GFR, Estimated: 48 mL/min — ABNORMAL LOW (ref >60)
Glucose, Bld: 280 mg/dL — ABNORMAL HIGH (ref 70–99)
Potassium: 4.5 mmol/L (ref 3.5–5.1)
Sodium: 133 mmol/L — ABNORMAL LOW (ref 135–145)
Total Bilirubin: 0.9 mg/dL (ref 0.0–1.2)
Total Protein: 7.5 g/dL (ref 6.5–8.1)

## 2023-04-26 LAB — TROPONIN I (HIGH SENSITIVITY): Troponin I (High Sensitivity): 4 ng/L (ref <18)

## 2023-04-26 LAB — RESP PANEL BY RT-PCR (RSV, FLU A&B, COVID)  RVPGX2
Influenza A by PCR: NEGATIVE
Influenza B by PCR: NEGATIVE
Resp Syncytial Virus by PCR: NEGATIVE
SARS Coronavirus 2 by RT PCR: NEGATIVE

## 2023-04-26 LAB — HEMOGLOBIN A1C
Hgb A1c MFr Bld: 9.7 % — ABNORMAL HIGH (ref 4.8–5.6)
Mean Plasma Glucose: 231.69 mg/dL

## 2023-04-26 LAB — GLUCOSE, CAPILLARY
Glucose-Capillary: 190 mg/dL — ABNORMAL HIGH (ref 70–99)
Glucose-Capillary: 206 mg/dL — ABNORMAL HIGH (ref 70–99)

## 2023-04-26 LAB — BETA-HYDROXYBUTYRIC ACID: Beta-Hydroxybutyric Acid: 2.67 mmol/L — ABNORMAL HIGH (ref 0.05–0.27)

## 2023-04-26 LAB — I-STAT CG4 LACTIC ACID, ED: Lactic Acid, Venous: 1.4 mmol/L (ref 0.5–1.9)

## 2023-04-26 LAB — CBG MONITORING, ED
Glucose-Capillary: 264 mg/dL — ABNORMAL HIGH (ref 70–99)
Glucose-Capillary: 191 mg/dL — ABNORMAL HIGH (ref 70–99)

## 2023-04-26 MED ORDER — ALUM & MAG HYDROXIDE-SIMETH 200-200-20 MG/5ML PO SUSP
30.0000 mL | Freq: Once | ORAL | Status: AC
  Administered 2023-04-26: 30 mL via ORAL
  Filled 2023-04-26: qty 30

## 2023-04-26 MED ORDER — ENOXAPARIN SODIUM 40 MG/0.4ML IJ SOSY
40.0000 mg | PREFILLED_SYRINGE | INTRAMUSCULAR | Status: DC
  Administered 2023-04-26: 40 mg via SUBCUTANEOUS
  Filled 2023-04-26: qty 0.4

## 2023-04-26 MED ORDER — INSULIN GLARGINE-YFGN 100 UNIT/ML ~~LOC~~ SOLN
30.0000 [IU] | Freq: Every day | SUBCUTANEOUS | Status: DC
  Filled 2023-04-26: qty 0.3

## 2023-04-26 MED ORDER — MAGNESIUM SULFATE 2 GM/50ML IV SOLN
2.0000 g | Freq: Once | INTRAVENOUS | Status: AC
  Administered 2023-04-26: 2 g via INTRAVENOUS
  Filled 2023-04-26: qty 50

## 2023-04-26 MED ORDER — LACTATED RINGERS IV BOLUS
1000.0000 mL | Freq: Once | INTRAVENOUS | Status: AC
  Administered 2023-04-26: 1000 mL via INTRAVENOUS

## 2023-04-26 MED ORDER — ACETAMINOPHEN 325 MG PO TABS: 650.0000 mg | ORAL_TABLET | Freq: Four times a day (QID) | ORAL | Status: DC | PRN

## 2023-04-26 MED ORDER — ONDANSETRON HCL 4 MG/2ML IJ SOLN: 4.0000 mg | Freq: Four times a day (QID) | INTRAMUSCULAR | Status: DC | PRN

## 2023-04-26 MED ORDER — DOLUTEGRAVIR-LAMIVUDINE 50-300 MG PO TABS
1.0000 | ORAL_TABLET | Freq: Every day | ORAL | Status: DC
  Administered 2023-04-27: 1 via ORAL
  Filled 2023-04-26: qty 1

## 2023-04-26 MED ORDER — INSULIN ASPART 100 UNIT/ML IJ SOLN
8.0000 [IU] | Freq: Once | INTRAMUSCULAR | Status: AC
  Administered 2023-04-26: 8 [IU] via SUBCUTANEOUS
  Filled 2023-04-26: qty 0.08

## 2023-04-26 MED ORDER — PANTOPRAZOLE SODIUM 40 MG IV SOLR
40.0000 mg | Freq: Once | INTRAVENOUS | Status: AC
  Administered 2023-04-26: 40 mg via INTRAVENOUS
  Filled 2023-04-26: qty 10

## 2023-04-26 MED ORDER — LACTATED RINGERS IV SOLN: INTRAVENOUS | Status: AC

## 2023-04-26 MED ORDER — LACTATED RINGERS IV BOLUS
500.0000 mL | Freq: Once | INTRAVENOUS | Status: AC
  Administered 2023-04-26: 500 mL via INTRAVENOUS

## 2023-04-26 MED ORDER — PANTOPRAZOLE SODIUM 40 MG PO TBEC
40.0000 mg | DELAYED_RELEASE_TABLET | Freq: Every day | ORAL | Status: DC
Start: 2023-04-26 — End: 2023-04-27
  Administered 2023-04-27: 40 mg via ORAL
  Filled 2023-04-26: qty 1

## 2023-04-26 MED ORDER — TRAZODONE HCL 50 MG PO TABS
50.0000 mg | ORAL_TABLET | Freq: Every evening | ORAL | Status: DC | PRN
  Administered 2023-04-26: 50 mg via ORAL
  Filled 2023-04-26: qty 1

## 2023-04-26 MED ORDER — ONDANSETRON HCL 4 MG PO TABS: 4.0000 mg | ORAL_TABLET | Freq: Four times a day (QID) | ORAL | Status: DC | PRN

## 2023-04-26 MED ORDER — INSULIN ASPART 100 UNIT/ML IJ SOLN
2.0000 [IU] | Freq: Once | INTRAMUSCULAR | Status: AC
  Administered 2023-04-26: 2 [IU] via SUBCUTANEOUS

## 2023-04-26 MED ORDER — ONDANSETRON HCL 4 MG/2ML IJ SOLN
4.0000 mg | Freq: Once | INTRAMUSCULAR | Status: AC
  Administered 2023-04-26: 4 mg via INTRAVENOUS
  Filled 2023-04-26: qty 2

## 2023-04-26 MED ORDER — ACETAMINOPHEN 650 MG RE SUPP: 650.0000 mg | Freq: Four times a day (QID) | RECTAL | Status: DC | PRN

## 2023-04-26 MED ORDER — GABAPENTIN 400 MG PO CAPS
600.0000 mg | ORAL_CAPSULE | Freq: Three times a day (TID) | ORAL | Status: DC
  Administered 2023-04-26 – 2023-04-27 (×2): 600 mg via ORAL
  Filled 2023-04-26 (×2): qty 2

## 2023-04-26 MED ORDER — INSULIN ASPART 100 UNIT/ML IJ SOLN
0.0000 [IU] | Freq: Three times a day (TID) | INTRAMUSCULAR | Status: DC
Start: 2023-04-26 — End: 2023-04-27
  Administered 2023-04-26: 3 [IU] via SUBCUTANEOUS
  Administered 2023-04-27: 8 [IU] via SUBCUTANEOUS

## 2023-04-26 MED ORDER — NICOTINE 21 MG/24HR TD PT24
21.0000 mg | MEDICATED_PATCH | Freq: Every day | TRANSDERMAL | Status: DC
  Administered 2023-04-26 – 2023-04-27 (×2): 21 mg via TRANSDERMAL
  Filled 2023-04-26 (×2): qty 1

## 2023-04-26 MED ORDER — SODIUM CHLORIDE 0.9 % IV BOLUS
1000.0000 mL | Freq: Once | INTRAVENOUS | Status: AC
  Administered 2023-04-26: 1000 mL via INTRAVENOUS

## 2023-04-26 NOTE — ED Triage Notes (Addendum)
Here by POV from home for NVD, decreased appetite, increased urination, acid reflux, NVD, and "feeling dehydrated". Reports high BS. BS at home was "high". Recently able to keep fluids down when taking POs in slower. Alert, NAD, calm, interactive. Sent to ED by/ from endocrinologist. BP low. Not sure if he had his BP meds this am.

## 2023-04-26 NOTE — H&P (Signed)
History and Physical    PatientDavantae Reed QIO:[id number] DOB: [birth year] DOA: 04/26/2023 DOS: the patient was seen and examined on 04/26/2023 PCP: Patient, No Pcp Per  Patient coming from: Home  Chief Complaint:  Chief Complaint  Patient presents with   Hyperglycemia   HPI: Carl Reed is a 58 y.o. male with medical history significant of type 2 diabetes, HIV disease, hypertension, normal LFTs, GERD who presented to the emergency department complaints of feeling dehydrated, hyperglycemia, but also been having decreased oral intake, burning epigastric discomfort, nausea, multiple episodes of emesis and diarrhea for the past 4 days.  No  constipation, melena or hematochezia. He denied fever, chills, rhinorrhea, sore throat, wheezing or hemoptysis.  No chest pain, palpitations, diaphoresis, PND, orthopnea or pitting edema of the lower extremities. No flank pain, dysuria, frequency or hematuria. No polyuria, polydipsia, polyphagia or blurred vision. He last used his long-acting insulin this morning.  Lab work: CBC showed white count 4.6, hemoglobin 12.7 g/dL with an MCV of 324.4 fL and platelets 245.  Venous blood gas showed a pCO2 of 31 mmHg, but was otherwise normal.  Beta hydroxybutyric acid was 2.67 mmol/L.  CMP showed a CO2 of 20 mmol/L with an anion gap of 15, the rest of the electrolytes and LFTs were normal after sodium correction.  Glucose 280, BUN 28 and creatinine 1.68 mg/dL.  Lactic acid and troponin were normal.  Imaging: 2 view chest radiograph with no active cardiopulmonary disease.  Normal heart size and mediastinal contours with clear lungs.   ED course: Initial vital signs were temperature 98.1 F, pulse 100 respirations 16, BP 82/64 mmHg O2 sat 100% on room air.  The patient received ondansetron 4 mg IVP, Protonix 40 mg IVP, Maalox 30 mL p.o. and 1000 mL of normal saline bolus.  Review of Systems: As mentioned in the history of present illness. All other systems reviewed and  are negative. Past Medical History:  Diagnosis Date   Diabetes mellitus without complication (HCC)    HIV (human immunodeficiency virus infection) (HCC)    Hypertension    History reviewed. No pertinent surgical history. Social History:  reports that he has been smoking cigarettes. He has never used smokeless tobacco. He reports current alcohol use of about 4.0 standard drinks of alcohol per week. He reports that he does not use drugs.  No Known Allergies  History reviewed. No pertinent family history.  Prior to Admission medications   Medication Sig Start Date End Date Taking? Authorizing Provider  DOVATO 50-300 MG tablet TAKE 1 TABLET BY MOUTH ONCE DAILY. TAKE 2 HOURS BEFORE MULTIVITAMIN, SUCRALAFATE, AND FEEDING SUPPLEMENT 06/15/22  Yes [name], MD  cyclobenzaprine (FLEXERIL) 10 MG tablet Take 1 tablet by mouth daily as needed for muscle spasms. 07/25/22   [provider]  famotidine (PEPCID) 20 MG tablet Take 1 tablet by mouth once daily 03/15/22   [name], MD  gabapentin (NEURONTIN) 300 MG capsule Take 600 mg by mouth 3 (three) times daily.    [provider]  insulin glargine, 2 Unit Dial, (TOUJEO MAX) 300 UNIT/ML Solostar Pen Inject 30 Units into the skin daily. 10/11/22   [name], MD  insulin lispro (HUMALOG) 100 UNIT/ML injection Continue as previously prescribed by [facility] endocrinologist depending on the scale Patient taking differently: Inject 5-20 Units into the skin 3 (three) times daily with meals. Continue as previously prescribed by [facility] endocrinologist depending on the scale  70-90 : 5/breakfast 3/lunch 7/supper 91-130: 9/breakfast 5/lunch 12/supper  131-150: 10/breakfast 6/lunch 13/supper 151-200: 11/breakfast 7/lunch 14/supper 201-250: 12/breakfast 8/lunch 15/supper 251-300: 13/breakfast 9/lunch 16/supper 301-350: 14/breakfast 10/lunch 17/supper 351-400: 15/breakfast 11/lunch 18/supper 401-450: 16/breakfast  12/lunch 19/supper >450: 17/breakfast 13/lunch 20/supper 03/28/20 02/09/23  [name], MD  lisinopril (ZESTRIL) 5 MG tablet Take 5 mg by mouth daily.    [provider]  Multiple Vitamin (MULTI-VITAMIN) tablet Take 1 tablet by mouth daily. 08/21/18   [provider]  nicotine (NICODERM CQ - DOSED IN MG/24 HOURS) 14 mg/24hr patch Place 14 mg onto the skin daily.    [provider]  omeprazole (PRILOSEC) 40 MG capsule Take 40 mg by mouth daily. 06/06/22   [provider]  tadalafil (CIALIS) 20 MG tablet Take 20 mg by mouth daily as needed for erectile dysfunction. 12/14/21   [provider]  traZODone (DESYREL) 50 MG tablet Take 50 mg by mouth at bedtime as needed for sleep. 11/25/20   [provider]    Physical Exam: Vitals:   04/26/23 1130 04/26/23 1200 04/26/23 1300 04/26/23 1345  BP: 111/83 (!) 115/91 137/87 (!) 154/97  Pulse:  87 83 88  Resp:  14 15 18   Temp:      TempSrc:      SpO2:  100% 100% 100%  Weight:       Physical Exam Vitals and nursing note reviewed.  Constitutional:      General: He is awake. He is not in acute distress.    Appearance: Normal appearance. He is ill-appearing.  HENT:     Head: Normocephalic.     Nose: No rhinorrhea.     Mouth/Throat:     Mouth: Mucous membranes are dry.  Eyes:     General: No scleral icterus.    Pupils: Pupils are equal, round, and reactive to light.  Neck:     Vascular: No JVD.  Cardiovascular:     Rate and Rhythm: Normal rate and regular rhythm.     Heart sounds: S1 normal and S2 normal.  Pulmonary:     Effort: Pulmonary effort is normal.     Breath sounds: Normal breath sounds. No wheezing, rhonchi or rales.  Abdominal:     General: Bowel sounds are normal. There is no distension.     Palpations: Abdomen is soft.     Tenderness: There is abdominal tenderness. There is no guarding or rebound.  Musculoskeletal:     Cervical back: Neck supple.     Right lower leg:  No edema.     Left lower leg: No edema.  Skin:    General: Skin is warm and dry.  Neurological:     General: No focal deficit present.     Mental Status: He is alert and oriented to person, place, and time.  Psychiatric:        Mood and Affect: Mood normal.        Behavior: Behavior normal. Behavior is cooperative.     Data Reviewed:  Results are pending, will review when available.  EKG: Vent. rate 88 BPM PR interval 147 ms QRS duration 62 ms QT/QTcB 339/411 ms P-R-T axes 77 67 76 Sinus rhythm Consider right atrial enlargement Anteroseptal infarct, age indeterminate  Assessment and Plan: Principal Problem:   AKI (acute kidney injury) (HCC) Observation/MedSurg. Continue IV fluids. Hold ARB/ACE. Avoid hypotension. Avoid nephrotoxins. Monitor intake and output. Monitor renal function/electrolytes.  Active Problems:   Diabetes (HCC) Carbohydrate modified diet. Continue Toujeo 30 units SQ daily or formulary equivalent. CBG monitoring with RI  SS. Check hemoglobin A1c.    HIV (human immunodeficiency virus infection) (HCC) Continue Dovato 1 tablet p.o. daily.    GERD (gastroesophageal reflux disease) Continue pantoprazole 40 mg p.o. daily.    Diabetic polyneuropathy associated with type 1 diabetes mellitus (HCC) Continue gabapentin 600 mg p.o. daily.    Essential hypertension Hold lisinopril for now. Monitor blood pressure. As needed oral or parenteral antihypertensives.    Tobacco use disorder Tobacco cessation advised. Nicotine replacement therapy ordered.    Alcohol abuse Mostly on the weekends according to the patient. Sometimes he might drink a beer or 2 during weekdays. No history of alcohol withdrawal. Will monitor clinically and start CIWA as needed. Magnesium sulfate 2 g IVPB.    Macrocytic anemia Monitor hematocrit and hemoglobin. Alcohol cessation.     Advance Care Planning:   Code Status: Full Code   Consults:   Family  Communication:   Severity of Illness: The appropriate patient status for this patient is OBSERVATION. Observation status is judged to be reasonable and necessary in order to provide the required intensity of service to ensure the patient's safety. The patient's presenting symptoms, physical exam findings, and initial radiographic and laboratory data in the context of their medical condition is felt to place them at decreased risk for further clinical deterioration. Furthermore, it is anticipated that the patient will be medically stable for discharge from the hospital within 2 midnights of admission.   Author: Bobette Mo, MD 04/26/2023 2:22 PM  For on call review [email or web address].   This document was prepared using Dragon voice recognition software and may contain some unintended transcription errors.

## 2023-04-26 NOTE — Plan of Care (Signed)

## 2023-04-26 NOTE — ED Provider Triage Note (Signed)
Emergency Medicine Provider Triage Evaluation Note  Carl Reed , a 58 y.o. male  was evaluated in triage.  Pt sent from endocrinology office for evaluation of low blood pressure and high blood sugar.  Patient reports that he has been taking his diabetes medications as usual for the past few days.  He has been having persistent nausea and vomiting for the past 3 days.  He reports that he often has this when his acid reflux flares up and it feels similar but he feels very dehydrated and generally weak.  Blood sugar was reading as "high" at home.  No blood in vomit or stool.  No focal abdominal pain just some burning sensation in the epigastric region.  Review of Systems  Positive: Nausea, vomiting, diarrhea Negative: Abdominal pain, blood in stool or emesis  Physical Exam  BP (!) 87/59 (BP Location: Right Arm)   Pulse 100   Temp 98.1 F (36.7 C) (Oral)   Resp 16   Wt 61.7 kg   SpO2 100%   BMI 18.97 kg/m  Gen:   Awake, no distress   Resp:  Normal effort  MSK:   Moves extremities without difficulty  Other:  Abdomen is soft, nontender to palpation in all quadrants.  Medical Decision Making  Medically screening exam initiated at 11:20 AM.  Appropriate orders placed.  Carl Reed was informed that the remainder of the evaluation will be completed by another provider, this initial triage assessment does not replace that evaluation, and the importance of remaining in the ED until their evaluation is complete.  On blood pressure recheck it is improved to 110/78.  Given episode of hypotension with GI losses patient will need lab work and IV fluids.  Medications ordered and patient should go back for next available room.   Carl Rumpf, PA-C 04/26/23 1134

## 2023-04-26 NOTE — ED Provider Notes (Signed)
[facility]MENT AT [facility] Provider Note   CSN: [id number] Arrival date & time: 04/26/23  1013     History  Chief Complaint  Patient presents with   Hyperglycemia    Carl Reed is a 58 y.o. male.  HPI 58 year old male presents with vomiting and chest/abdominal pain.  He is concerned about possible DKA, which she has had before.  He was at his endocrinologist office his blood pressure was low and he was sent here.  For the past 3-4 days he has been having vomiting as well as a little bit of diarrhea.  No hematemesis.  Has pain in his epigastrium and burning in his chest.  The chest pain is sometimes burning and sometimes a pressure.  Has been going on for several days. Occasionally feels short of breath.  He has been having some headaches but no fevers.  He has felt this way with DKA before.  He also has a history of reflux which feels similar.  He has not been taking any antiacid medicines recently.  Home Medications Prior to Admission medications   Medication Sig Start Date End Date Taking? Authorizing Provider  DOVATO 50-300 MG tablet TAKE 1 TABLET BY MOUTH ONCE DAILY. TAKE 2 HOURS BEFORE MULTIVITAMIN, SUCRALAFATE, AND FEEDING SUPPLEMENT 06/15/22  Yes [name], MD  cyclobenzaprine (FLEXERIL) 10 MG tablet Take 1 tablet by mouth daily as needed for muscle spasms. 07/25/22   [provider]  famotidine (PEPCID) 20 MG tablet Take 1 tablet by mouth once daily 03/15/22   [name], MD  gabapentin (NEURONTIN) 300 MG capsule Take 600 mg by mouth 3 (three) times daily.    [provider]  insulin glargine, 2 Unit Dial, (TOUJEO MAX) 300 UNIT/ML Solostar Pen Inject 30 Units into the skin daily. 10/11/22   [name], MD  insulin lispro (HUMALOG) 100 UNIT/ML injection Continue as previously prescribed by [facility] endocrinologist depending on the scale Patient taking differently: Inject 5-20 Units into the skin 3 (three)  times daily with meals. Continue as previously prescribed by [facility] endocrinologist depending on the scale  70-90 : 5/breakfast 3/lunch 7/supper 91-130: 9/breakfast 5/lunch 12/supper 131-150: 10/breakfast 6/lunch 13/supper 151-200: 11/breakfast 7/lunch 14/supper 201-250: 12/breakfast 8/lunch 15/supper 251-300: 13/breakfast 9/lunch 16/supper 301-350: 14/breakfast 10/lunch 17/supper 351-400: 15/breakfast 11/lunch 18/supper 401-450: 16/breakfast 12/lunch 19/supper >450: 17/breakfast 13/lunch 20/supper 03/28/20 02/09/23  [name], MD  lisinopril (ZESTRIL) 5 MG tablet Take 5 mg by mouth daily.    [provider]  Multiple Vitamin (MULTI-VITAMIN) tablet Take 1 tablet by mouth daily. 08/21/18   [provider]  nicotine (NICODERM CQ - DOSED IN MG/24 HOURS) 14 mg/24hr patch Place 14 mg onto the skin daily.    [provider]  omeprazole (PRILOSEC) 40 MG capsule Take 40 mg by mouth daily. 06/06/22   [provider]  tadalafil (CIALIS) 20 MG tablet Take 20 mg by mouth daily as needed for erectile dysfunction. 12/14/21   [provider]  traZODone (DESYREL) 50 MG tablet Take 50 mg by mouth at bedtime as needed for sleep. 11/25/20   [provider]      Allergies    Patient has no known allergies.    Review of Systems   Review of Systems  Constitutional:  Negative for fever.  Respiratory:  Negative for shortness of breath.   Cardiovascular:  Positive for chest pain.  Gastrointestinal:  Positive for abdominal pain, diarrhea, nausea and vomiting.    Physical Exam Updated  Vital Signs BP (!) 145/88   Pulse 82   Temp 98.6 F (37 C) (Oral)   Resp 18   Wt 61.7 kg   SpO2 100%   BMI 18.97 kg/m  Physical Exam Vitals and nursing note reviewed.  Constitutional:      General: He is not in acute distress.    Appearance: He is well-developed. He is not ill-appearing or diaphoretic.  HENT:     Head: Normocephalic and atraumatic.   Cardiovascular:     Rate and Rhythm: Normal rate and regular rhythm.     Heart sounds: Normal heart sounds.  Pulmonary:     Effort: Pulmonary effort is normal.     Breath sounds: Normal breath sounds.  Abdominal:     General: There is no distension.     Palpations: Abdomen is soft.     Tenderness: There is no abdominal tenderness.  Skin:    General: Skin is warm and dry.  Neurological:     Mental Status: He is alert.     ED Results / Procedures / Treatments   Labs (all labs ordered are listed, but only abnormal results are displayed) Labs Reviewed  COMPREHENSIVE METABOLIC PANEL - Abnormal; Notable for the following components:      Result Value   Sodium 133 (*)    CO2 20 (*)    Glucose, Bld 280 (*)    BUN 28 (*)    Creatinine, Ser 1.68 (*)    GFR, Estimated 48 (*)    All other components within normal limits  CBC WITH DIFFERENTIAL/PLATELET - Abnormal; Notable for the following components:   RBC 3.75 (*)    Hemoglobin 12.7 (*)    HCT 38.2 (*)    MCV 101.9 (*)    All other components within normal limits  BLOOD GAS, VENOUS - Abnormal; Notable for the following components:   pO2, Ven 31 (*)    All other components within normal limits  BETA-HYDROXYBUTYRIC ACID - Abnormal; Notable for the following components:   Beta-Hydroxybutyric Acid 2.67 (*)    All other components within normal limits  CBG MONITORING, ED - Abnormal; Notable for the following components:   Glucose-Capillary 264 (*)    All other components within normal limits  CBG MONITORING, ED - Abnormal; Notable for the following components:   Glucose-Capillary 191 (*)    All other components within normal limits  RESP PANEL BY RT-PCR (RSV, FLU A&B, COVID)  RVPGX2  URINALYSIS, ROUTINE W REFLEX MICROSCOPIC  HEMOGLOBIN A1C  I-STAT CG4 LACTIC ACID, ED  CBG MONITORING, ED  TROPONIN I (HIGH SENSITIVITY)    EKG EKG Interpretation Date/Time:  Thursday April 26 2023 11:59:40 EST Ventricular Rate:  88 PR  Interval:  147 QRS Duration:  62 QT Interval:  339 QTC Calculation: 411 R Axis:   67  Text Interpretation: Sinus rhythm Consider right atrial enlargement Anteroseptal infarct, age indeterminate  no acute ST/T changes compared to July 2024 Confirmed by Pricilla Loveless [phone]) on 04/26/2023 12:53:46 PM  Radiology DG Chest 2 View Result Date: 04/26/2023 CLINICAL DATA:  Chest pain and vomiting. EXAM: CHEST - 2 VIEW COMPARISON:  Chest x-ray dated October 15, 2022. FINDINGS: The heart size and mediastinal contours are within normal limits. Both lungs are clear. The visualized skeletal structures are unremarkable. IMPRESSION: No active cardiopulmonary disease. Electronically Signed   By: Obie Dredge M.D.   On: 04/26/2023 13:32    Procedures Procedures    Medications Ordered in ED Medications  lactated ringers  infusion (has no administration in time range)  enoxaparin (LOVENOX) injection 40 mg (has no administration in time range)  acetaminophen (TYLENOL) tablet 650 mg (has no administration in time range)    Or  acetaminophen (TYLENOL) suppository 650 mg (has no administration in time range)  ondansetron (ZOFRAN) tablet 4 mg (has no administration in time range)    Or  ondansetron (ZOFRAN) injection 4 mg (has no administration in time range)  insulin aspart (novoLOG) injection 0-15 Units (has no administration in time range)  sodium chloride 0.9 % bolus 1,000 mL (1,000 mLs Intravenous New Bag/Given 04/26/23 1240)  ondansetron (ZOFRAN) injection 4 mg (4 mg Intravenous Given 04/26/23 1309)  pantoprazole (PROTONIX) injection 40 mg (40 mg Intravenous Given 04/26/23 1310)  alum & mag hydroxide-simeth (MAALOX/MYLANTA) 200-200-20 MG/5ML suspension 30 mL (30 mLs Oral Given 04/26/23 1300)  insulin aspart (novoLOG) injection 8 Units (8 Units Subcutaneous Given 04/26/23 1525)  lactated ringers bolus 500 mL (500 mLs Intravenous New Bag/Given 04/26/23 1530)    ED Course/ Medical Decision Making/ A&P                                  Medical Decision Making Amount and/or Complexity of Data Reviewed Labs: ordered.    Details: Hyperglycemia with AKI.  However the bicarb is minimally low but anion gap is only 15.  Does have elevated beta hydroxybutyric acid. Radiology: ordered.    Details: No CHF ECG/medicine tests: ordered and independent interpretation performed.    Details: No ischemia  Risk OTC drugs. Prescription drug management. Decision regarding hospitalization.   Patient was started on IV fluids.  I suspect he has dehydration causing an AKI.  He has recurrent GERD/gastritis symptoms.  However he has a benign exam and I do not think CT is needed.  He does have some chest symptoms and is a diabetic but his EKG is benign and his troponin is negative.  His labs are slightly abnormal to the point that he could be in mild DKA with an anion gap of 15 and a bicarb of 20 and an elevated beta hydroxybutyric of 2.67.  Discussed with Dr. Robb Matar, I think we could treat with subcutaneous insulin and continued fluids.  He recommends lactated Ringer's at 75 an hour for 20 hours.  Will admit.        Final Clinical Impression(s) / ED Diagnoses Final diagnoses:  Acute kidney injury [facility])    Rx / DC Orders ED Discharge Orders     None         Pricilla Loveless, MD 04/26/23 1538

## 2023-04-27 DIAGNOSIS — N179 Acute kidney failure, unspecified: Secondary | ICD-10-CM | POA: Diagnosis not present

## 2023-04-27 LAB — CBC
HCT: 36.5 % — ABNORMAL LOW (ref 39.0–52.0)
Hemoglobin: 12 g/dL — ABNORMAL LOW (ref 13.0–17.0)
MCH: 33.8 pg (ref 26.0–34.0)
MCHC: 32.9 g/dL (ref 30.0–36.0)
MCV: 102.8 fL — ABNORMAL HIGH (ref 80.0–100.0)
Platelets: 195 10*3/uL (ref 150–400)
RBC: 3.55 MIL/uL — ABNORMAL LOW (ref 4.22–5.81)
RDW: 12.5 % (ref 11.5–15.5)
WBC: 5.6 10*3/uL (ref 4.0–10.5)
nRBC: 0 % (ref 0.0–0.2)

## 2023-04-27 LAB — COMPREHENSIVE METABOLIC PANEL
ALT: 30 U/L (ref 0–44)
AST: 31 U/L (ref 15–41)
Albumin: 3.1 g/dL — ABNORMAL LOW (ref 3.5–5.0)
Alkaline Phosphatase: 78 U/L (ref 38–126)
Anion gap: 10 (ref 5–15)
BUN: 18 mg/dL (ref 6–20)
CO2: 23 mmol/L (ref 22–32)
Calcium: 9 mg/dL (ref 8.9–10.3)
Chloride: 104 mmol/L (ref 98–111)
Creatinine, Ser: 1.02 mg/dL (ref 0.61–1.24)
GFR, Estimated: >60 mL/min (ref >60)
Glucose, Bld: 77 mg/dL (ref 70–99)
Potassium: 3.4 mmol/L — ABNORMAL LOW (ref 3.5–5.1)
Sodium: 137 mmol/L (ref 135–145)
Total Bilirubin: 0.7 mg/dL (ref 0.0–1.2)
Total Protein: 6.2 g/dL — ABNORMAL LOW (ref 6.5–8.1)

## 2023-04-27 LAB — GLUCOSE, CAPILLARY
Glucose-Capillary: 112 mg/dL — ABNORMAL HIGH (ref 70–99)
Glucose-Capillary: 57 mg/dL — ABNORMAL LOW (ref 70–99)
Glucose-Capillary: 271 mg/dL — ABNORMAL HIGH (ref 70–99)

## 2023-04-27 MED ORDER — PANTOPRAZOLE SODIUM 40 MG PO TBEC: 40.0000 mg | DELAYED_RELEASE_TABLET | Freq: Every day | ORAL | 0 refills | Status: DC

## 2023-04-27 NOTE — Hospital Course (Addendum)
58 y.o. male with medical history significant of type 2 diabetes, HIV disease, hypertension, normal LFTs, GERD who presented to the ED w/ feeling dehydrated, hyperglycemia, but also been having decreased oral intake, burning epigastric discomfort, nausea, multiple episodes of emesis and diarrhea for the past 4 days. In the ED: Vitals initially hypotensive 82/64, afebrile heart rate in 100. Labs with stable VBG, CMP normal LFTs creatinine 1.6 bicarb 20 sodium 133 normal lactic acid troponin 4 CBC with mild anemia 12.7 g influenza COVID RSV negative, UA with proteinuria and glucosuria ketones 5 Chest x-ray no active disease. Patient received multiple IV fluid boluses magnesium PPI insulin and admitted overnight Overnight blood sugar on lower side 57, labs shows improved renal function stable CBC At this time tolerating diet hypoglycemia has resolved, patient is eager to go home,

## 2023-04-27 NOTE — Progress Notes (Signed)
   04/27/23 1314  TOC Brief Assessment  Insurance and Status Reviewed  Patient has primary care physician No  Home environment has been reviewed home with spouse  Prior level of function: independent  Prior/Current Home Services No current home services  Social Drivers of Health Review SDOH reviewed no interventions necessary  Readmission risk has been reviewed Yes  Transition of care needs no transition of care needs at this time

## 2023-04-27 NOTE — Discharge Summary (Signed)
Physician Discharge Summary  Carl Reed XLK:[id number] DOB: 02/26/1966 DOA: 04/26/2023  PCP: Patient, No Pcp Per  Admit date: 04/26/2023 Discharge date: 04/27/2023 Recommendations for Outpatient Follow-up:  Follow up with PCP in 1 weeks-call for appointment Please obtain BMP/CBC in one week  Discharge Dispo: home Discharge Condition: Stable Code Status:   Code Status: Full Code Diet recommendation:  Diet Order             DIET SOFT Room service appropriate? Yes; Fluid consistency: Thin  Diet effective now           Diet - low sodium heart healthy                    Brief/Interim Summary: 58 y.o. male with medical history significant of type 2 diabetes, HIV disease, hypertension, normal LFTs, GERD who presented to the ED w/ feeling dehydrated, hyperglycemia, but also been having decreased oral intake, burning epigastric discomfort, nausea, multiple episodes of emesis and diarrhea for the past 4 days. In the ED: Vitals initially hypotensive 82/64, afebrile heart rate in 100. Labs with stable VBG, CMP normal LFTs creatinine 1.6 bicarb 20 sodium 133 normal lactic acid troponin 4 CBC with mild anemia 12.7 g influenza COVID RSV negative, UA with proteinuria and glucosuria ketones 5 Chest x-ray no active disease. Patient received multiple IV fluid boluses magnesium PPI insulin and admitted overnight Overnight blood sugar on lower side 57, labs shows improved renal function stable CBC At this time tolerating diet hypoglycemia has resolved, patient is eager to go home,     Discharge Diagnoses:  Principal Problem:   AKI (acute kidney injury) (HCC) Active Problems:   Diabetes (HCC)   HIV (human immunodeficiency virus infection) (HCC)   GERD (gastroesophageal reflux disease)   Macrocytic anemia   Diabetic polyneuropathy associated with type 1 diabetes mellitus (HCC)   Essential hypertension   Tobacco use disorder   Alcohol abuse   AKI Dehydration/volume depletion: Resolved  with IV fluids.  Encourage p.o. is tolerating diet, eager to go home today  Diabetes mellitus with borderline blood glucose with polyneuropathy:  PTA on glargine 25u in am, ssi.  Blood sugar and slight lower side insulin held blood sugar has improved this is due to n.p.o. status-patient is aware and will resume insulin slowly and he is eager for discharge home today advised to check blood sugar 4 times a day at home. Continue Neurontin  Essential hypertension: Hypotensive on admission from volume depletion, resume lisinopril upon discharge  Tobacco use disorder Alcohol abuse: Drinks mostly on the weekends, watch for withdrawals.  No signs of withdrawal  GERD: Continue PPI, was having vomiting in the setting of likely alcohol gastritis versus GERD.  Sent prescription for PPI  HIV: continue home HAART  Macrocytic anemia: Chronic advised alcohol cessation  Consults: none Subjective: Aaox3 eating well  Discharge Exam: Vitals:   04/27/23 0635 04/27/23 1007  BP: 116/65 117/68  Pulse: 75 83  Resp: 18 18  Temp: 97.8 F (36.6 C) 99 F (37.2 C)  SpO2: 100% 100%   General: Pt is alert, awake, not in acute distress Cardiovascular: RRR, S1/S2 +, no rubs, no gallops Respiratory: CTA bilaterally, no wheezing, no rhonchi Abdominal: Soft, NT, ND, bowel sounds + Extremities: no edema, no cyanosis  Discharge Instructions  Discharge Instructions     Diet - low sodium heart healthy   Complete by: As directed    Discharge instructions   Complete by: As directed    Please call call  MD or return to ER for similar or worsening recurring problem that brought you to hospital or if any fever,nausea/vomiting,abdominal pain, uncontrolled pain, chest pain,  shortness of breath or any other alarming symptoms.   Please follow-up your doctor as instructed in a week time and call the office for appointment.   Please check your blood sugar 4 times a day at home and resume insulin slowly as you  normally do Check blood sugar 3 times a day and bedtime at home. If blood sugar running above 200 less than 70 please call your MD to adjust insulin. If blood sugars running less 100 do not use insulin and call MD. If you noticed signs and symptoms of hypoglycemia or low blood sugar like jitteriness, confusion, thirst, tremor, sweating- Check blood sugar, drink sugary drink/biscuits/sweets to increase sugar level and call MD or return to ER.  Please avoid alcohol, smoking, or any other illicit substance and maintain healthy habits including taking your regular medications as prescribed.  You were cared for by a hospitalist during your hospital stay. If you have any questions about your discharge medications or the care you received while you were in the hospital after you are discharged, you can call the unit and ask to speak with the hospitalist on call if the hospitalist that took care of you is not available.  Once you are discharged, your primary care physician will handle any further medical issues. Please note that NO REFILLS for any discharge medications will be authorized once you are discharged, as it is imperative that you return to your primary care physician (or establish a relationship with a primary care physician if you do not have one) for your aftercare needs so that they can reassess your need for medications and monitor your lab values   Increase activity slowly   Complete by: As directed    No wound care   Complete by: As directed       Allergies as of 04/27/2023   No Known Allergies      Medication List     STOP taking these medications    famotidine 20 MG tablet Commonly known as: PEPCID   omeprazole 40 MG capsule Commonly known as: PRILOSEC Replaced by: pantoprazole 40 MG tablet       TAKE these medications    Artificial Tears PF 0.1-0.3 % Soln Generic drug: Dextran 70-Hypromellose (PF) Place 1 drop into both eyes 3 (three) times daily as needed (for  dryness).   cyclobenzaprine 10 MG tablet Commonly known as: FLEXERIL Take 10 mg by mouth daily as needed for muscle spasms.   Dovato 50-300 MG tablet Generic drug: dolutegravir-lamiVUDine TAKE 1 TABLET BY MOUTH ONCE DAILY. TAKE 2 HOURS BEFORE MULTIVITAMIN, SUCRALAFATE, AND FEEDING SUPPLEMENT What changed: See the new instructions.   FreeStyle Libre 3 Plus Sensor Misc Inject 1 Device into the skin every 14 (fourteen) days.   gabapentin 300 MG capsule Commonly known as: NEURONTIN Take 600 mg by mouth 3 (three) times daily.   insulin glargine (2 Unit Dial) 300 UNIT/ML Solostar Pen Commonly known as: TOUJEO MAX Inject 30 Units into the skin daily. What changed:  how much to take when to take this   insulin lispro 100 UNIT/ML injection Commonly known as: HUMALOG Continue as previously prescribed by [facility] endocrinologist depending on the scale What changed:  when to take this additional instructions   lisinopril 5 MG tablet Commonly known as: ZESTRIL Take 5 mg by mouth daily.   Multi-Vitamin tablet  Take 1 tablet by mouth daily with breakfast.   nicotine 14 mg/24hr patch Commonly known as: NICODERM CQ - dosed in mg/24 hours Place 14 mg onto the skin daily.   NON FORMULARY Take 1 capsule by mouth See admin instructions. UMZU Testro-X Testosterone Supplement for Men- Take 1 capsule by mouth once a day   NON FORMULARY Take 1 capsule by mouth See admin instructions. Sea Moss- Take 1 capsule by mouth once a day   NON FORMULARY Take 1-3 g by mouth See admin instructions. Pure Himalayan Shilajit- Mix 1-3 grams into a beverage and drink by mouth once a day to improve blood flow   pantoprazole 40 MG tablet Commonly known as: PROTONIX Take 1 tablet (40 mg total) by mouth daily. Start taking on: April 28, 2023 Replaces: omeprazole 40 MG capsule   tadalafil 20 MG tablet Commonly known as: CIALIS Take 20 mg by mouth daily as needed for erectile dysfunction.    traZODone 50 MG tablet Commonly known as: DESYREL Take 50 mg by mouth at bedtime as needed for sleep.   TYLENOL 500 MG tablet Generic drug: acetaminophen Take 500-1,000 mg by mouth every 6 (six) hours as needed for mild pain (pain score 1-3), moderate pain (pain score 4-6) or headache.        Follow-up Information     [facility] Follow up in 1 week(s).   Contact information: [address] [place] [address] [phone]               No Known Allergies  The results of significant diagnostics from this hospitalization (including imaging, microbiology, ancillary and laboratory) are listed below for reference.    Microbiology: Recent Results (from the past 240 hours)  Resp panel by RT-PCR (RSV, Flu A&B, Covid) Anterior Nasal Swab     Status: None   Collection Time: 04/26/23 12:22 PM   Specimen: Anterior Nasal Swab  Result Value Ref Range Status   SARS Coronavirus 2 by RT PCR NEGATIVE NEGATIVE Final    Comment: (NOTE) SARS-CoV-2 target nucleic acids are NOT DETECTED.  The SARS-CoV-2 RNA is generally detectable in upper respiratory specimens during the acute phase of infection. The lowest concentration of SARS-CoV-2 viral copies this assay can detect is 138 copies/mL. A negative result does not preclude SARS-Cov-2 infection and should not be used as the sole basis for treatment or other patient management decisions. A negative result may occur with  improper specimen collection/handling, submission of specimen other than nasopharyngeal swab, presence of viral mutation(s) within the areas targeted by this assay, and inadequate number of viral copies(<138 copies/mL). A negative result must be combined with clinical observations, patient history, and epidemiological information. The expected result is Negative.  Fact Sheet for Patients:  BloggerCourse.com  Fact Sheet for  Healthcare Providers:  SeriousBroker.it  This test is no t yet approved or cleared by the [place] FDA and  has been authorized for detection and/or diagnosis of SARS-CoV-2 by FDA under an Emergency Use Authorization (EUA). This EUA will remain  in effect (meaning this test can be used) for the duration of the COVID-19 declaration under Section 564(b)(1) of the Act, 21 U.S.C.section 360bbb-3(b)(1), unless the authorization is terminated  or revoked sooner.       Influenza A by PCR NEGATIVE NEGATIVE Final   Influenza B by PCR NEGATIVE NEGATIVE Final    Comment: (NOTE) The Xpert Xpress SARS-CoV-2/FLU/RSV plus assay is intended as an aid in  the diagnosis of influenza from Nasopharyngeal swab specimens and should not be used as a sole basis for treatment. Nasal washings and aspirates are unacceptable for Xpert Xpress SARS-CoV-2/FLU/RSV testing.  Fact Sheet for Patients: BloggerCourse.com  Fact Sheet for Healthcare Providers: SeriousBroker.it  This test is not yet approved or cleared by the [place] FDA and has been authorized for detection and/or diagnosis of SARS-CoV-2 by FDA under an Emergency Use Authorization (EUA). This EUA will remain in effect (meaning this test can be used) for the duration of the COVID-19 declaration under Section 564(b)(1) of the Act, 21 U.S.C. section 360bbb-3(b)(1), unless the authorization is terminated or revoked.     Resp Syncytial Virus by PCR NEGATIVE NEGATIVE Final    Comment: (NOTE) Fact Sheet for Patients: BloggerCourse.com  Fact Sheet for Healthcare Providers: SeriousBroker.it  This test is not yet approved or cleared by the [place] FDA and has been authorized for detection and/or diagnosis of SARS-CoV-2 by FDA under an Emergency Use Authorization (EUA). This EUA will remain in effect (meaning this  test can be used) for the duration of the COVID-19 declaration under Section 564(b)(1) of the Act, 21 U.S.C. section 360bbb-3(b)(1), unless the authorization is terminated or revoked.  Performed at [facility], [address] [place], [place] [address]     Procedures/Studies: DG Chest 2 View Result Date: 04/26/2023 CLINICAL DATA:  Chest pain and vomiting. EXAM: CHEST - 2 VIEW COMPARISON:  Chest x-ray dated October 15, 2022. FINDINGS: The heart size and mediastinal contours are within normal limits. Both lungs are clear. The visualized skeletal structures are unremarkable. IMPRESSION: No active cardiopulmonary disease. Electronically Signed   By: Obie Dredge M.D.   On: 04/26/2023 13:32    Labs: BNP (last 3 results) Recent Labs    10/15/22 1145  BNP 81.7   Basic Metabolic Panel: Recent Labs  Lab 04/26/23 1050 04/27/23 0427  NA 133* 137  K 4.5 3.4*  CL 98 104  CO2 20* 23  GLUCOSE 280* 77  BUN 28* 18  CREATININE 1.68* 1.02  CALCIUM 10.1 9.0   Liver Function Tests: Recent Labs  Lab 04/26/23 1050 04/27/23 0427  AST 29 31  ALT 38 30  ALKPHOS 95 78  BILITOT 0.9 0.7  PROT 7.5 6.2*  ALBUMIN 3.9 3.1*   No results for input(s): "LIPASE", "AMYLASE" in the last 168 hours. No results for input(s): "AMMONIA" in the last 168 hours. CBC: Recent Labs  Lab 04/26/23 1050 04/27/23 0427  WBC 4.6 5.6  NEUTROABS 2.6  --   HGB 12.7* 12.0*  HCT 38.2* 36.5*  MCV 101.9* 102.8*  PLT 245 195   Cardiac Enzymes: No results for input(s): "CKTOTAL", "CKMB", "CKMBINDEX", "TROPONINI" in the last 168 hours. BNP: Invalid input(s): "POCBNP" CBG: Recent Labs  Lab 04/26/23 1640 04/26/23 2207 04/27/23 0744 04/27/23 0812 04/27/23 1221  GLUCAP 190* 206* 57* 112* 271*   D-Dimer No results for input(s): "DDIMER" in the last 72 hours. Hgb A1c Recent Labs    04/26/23 1638  HGBA1C 9.7*   Lipid Profile No results for input(s): "CHOL", "HDL", "LDLCALC", "TRIG",  "CHOLHDL", "LDLDIRECT" in the last 72 hours. Thyroid function studies No results for input(s): "TSH", "T4TOTAL", "T3FREE", "THYROIDAB" in the last 72 hours.  Invalid input(s): "FREET3" Anemia work up No results for input(s): "VITAMINB12", "FOLATE", "FERRITIN", "TIBC", "IRON", "RETICCTPCT" in the last 72 hours. Urinalysis    Component Value Date/Time   COLORURINE YELLOW 04/26/2023 1050   APPEARANCEUR HAZY (A) 04/26/2023 1050  LABSPEC 1.013 04/26/2023 1050   PHURINE 6.0 04/26/2023 1050   GLUCOSEU >=500 (A) 04/26/2023 1050   HGBUR SMALL (A) 04/26/2023 1050   BILIRUBINUR NEGATIVE 04/26/2023 1050   KETONESUR 5 (A) 04/26/2023 1050   PROTEINUR 30 (A) 04/26/2023 1050   NITRITE NEGATIVE 04/26/2023 1050   LEUKOCYTESUR NEGATIVE 04/26/2023 1050   Sepsis Labs Recent Labs  Lab 04/26/23 1050 04/27/23 0427  WBC 4.6 5.6   Microbiology Recent Results (from the past 240 hours)  Resp panel by RT-PCR (RSV, Flu A&B, Covid) Anterior Nasal Swab     Status: None   Collection Time: 04/26/23 12:22 PM   Specimen: Anterior Nasal Swab  Result Value Ref Range Status   SARS Coronavirus 2 by RT PCR NEGATIVE NEGATIVE Final    Comment: (NOTE) SARS-CoV-2 target nucleic acids are NOT DETECTED.  The SARS-CoV-2 RNA is generally detectable in upper respiratory specimens during the acute phase of infection. The lowest concentration of SARS-CoV-2 viral copies this assay can detect is 138 copies/mL. A negative result does not preclude SARS-Cov-2 infection and should not be used as the sole basis for treatment or other patient management decisions. A negative result may occur with  improper specimen collection/handling, submission of specimen other than nasopharyngeal swab, presence of viral mutation(s) within the areas targeted by this assay, and inadequate number of viral copies(<138 copies/mL). A negative result must be combined with clinical observations, patient history, and epidemiological information.  The expected result is Negative.  Fact Sheet for Patients:  BloggerCourse.com  Fact Sheet for Healthcare Providers:  SeriousBroker.it  This test is no t yet approved or cleared by the [place] FDA and  has been authorized for detection and/or diagnosis of SARS-CoV-2 by FDA under an Emergency Use Authorization (EUA). This EUA will remain  in effect (meaning this test can be used) for the duration of the COVID-19 declaration under Section 564(b)(1) of the Act, 21 U.S.C.section 360bbb-3(b)(1), unless the authorization is terminated  or revoked sooner.       Influenza A by PCR NEGATIVE NEGATIVE Final   Influenza B by PCR NEGATIVE NEGATIVE Final    Comment: (NOTE) The Xpert Xpress SARS-CoV-2/FLU/RSV plus assay is intended as an aid in the diagnosis of influenza from Nasopharyngeal swab specimens and should not be used as a sole basis for treatment. Nasal washings and aspirates are unacceptable for Xpert Xpress SARS-CoV-2/FLU/RSV testing.  Fact Sheet for Patients: BloggerCourse.com  Fact Sheet for Healthcare Providers: SeriousBroker.it  This test is not yet approved or cleared by the [place] FDA and has been authorized for detection and/or diagnosis of SARS-CoV-2 by FDA under an Emergency Use Authorization (EUA). This EUA will remain in effect (meaning this test can be used) for the duration of the COVID-19 declaration under Section 564(b)(1) of the Act, 21 U.S.C. section 360bbb-3(b)(1), unless the authorization is terminated or revoked.     Resp Syncytial Virus by PCR NEGATIVE NEGATIVE Final    Comment: (NOTE) Fact Sheet for Patients: BloggerCourse.com  Fact Sheet for Healthcare Providers: SeriousBroker.it  This test is not yet approved or cleared by the [place] FDA and has been authorized for detection and/or  diagnosis of SARS-CoV-2 by FDA under an Emergency Use Authorization (EUA). This EUA will remain in effect (meaning this test can be used) for the duration of the COVID-19 declaration under Section 564(b)(1) of the Act, 21 U.S.C. section 360bbb-3(b)(1), unless the authorization is terminated or revoked.  Performed at [facility], [address] [place], [place]  [address]      Time coordinating discharge: 25 minutes  SIGNED: [name], MD  [facility] 04/27/2023, 12:43 PM  If 7PM-7AM, please contact night-coverage [email or web address]

## 2023-06-01 ENCOUNTER — Emergency Department (HOSPITAL_BASED_OUTPATIENT_CLINIC_OR_DEPARTMENT_OTHER): Payer: BC Managed Care – PPO

## 2023-06-01 ENCOUNTER — Encounter (HOSPITAL_BASED_OUTPATIENT_CLINIC_OR_DEPARTMENT_OTHER): Payer: Self-pay | Admitting: Urology

## 2023-06-01 ENCOUNTER — Emergency Department (HOSPITAL_BASED_OUTPATIENT_CLINIC_OR_DEPARTMENT_OTHER)
Admission: EM | Admit: 2023-06-01 | Discharge: 2023-06-01 | Disposition: A | Discharge status: Home / Self Care | Payer: BC Managed Care – PPO | Attending: Emergency Medicine | Admitting: Emergency Medicine

## 2023-06-01 DIAGNOSIS — Z794 Long term (current) use of insulin: Secondary | ICD-10-CM | POA: Diagnosis not present

## 2023-06-01 DIAGNOSIS — W19XXXA Unspecified fall, initial encounter: Secondary | ICD-10-CM | POA: Diagnosis not present

## 2023-06-01 DIAGNOSIS — S3992XA Unspecified injury of lower back, initial encounter: Secondary | ICD-10-CM | POA: Insufficient documentation

## 2023-06-01 MED ORDER — DOCUSATE SODIUM 100 MG PO CAPS: 100.0000 mg | ORAL_CAPSULE | Freq: Two times a day (BID) | ORAL | 0 refills | Status: DC

## 2023-06-01 MED ORDER — HYDROCODONE-ACETAMINOPHEN 5-325 MG PO TABS: 1.0000 | ORAL_TABLET | Freq: Four times a day (QID) | ORAL | 0 refills | Status: DC | PRN

## 2023-06-01 NOTE — Discharge Instructions (Addendum)
 Contact a health care provider if: Your pain becomes worse or is not controlled with medicine. Your bowel movements cause a great deal of discomfort. You are unable to have a bowel movement after 4 days. You have pain during sex.

## 2023-06-01 NOTE — ED Provider Notes (Signed)
 [facility] Provider Note   CSN: [id number] Arrival date & time: 06/01/23  1949     History  Chief Complaint  Patient presents with   Carl Reed is a 58 y.o. male with a past medical history of bilateral AVN and poorly controlled diabetes who presents emergency department with mechanical fall.  Patient states that he fell due to the pain in his hips directly on his bottom.  He complains of severe tailbone pain.  He has been ambulatory with a cane prior to arrival.  He took Tylenol without relief.   Fall       Home Medications Prior to Admission medications   Medication Sig Start Date End Date Taking? Authorizing Provider  ARTIFICIAL TEARS PF 0.1-0.3 % SOLN Place 1 drop into both eyes 3 (three) times daily as needed (for dryness).    [provider]  Continuous Glucose Sensor (FREESTYLE LIBRE 3 PLUS SENSOR) MISC Inject 1 Device into the skin every 14 (fourteen) days.    [provider]  cyclobenzaprine (FLEXERIL) 10 MG tablet Take 10 mg by mouth daily as needed for muscle spasms. 07/25/22   [provider]  DOVATO 50-300 MG tablet TAKE 1 TABLET BY MOUTH ONCE DAILY. TAKE 2 HOURS BEFORE MULTIVITAMIN, SUCRALAFATE, AND FEEDING SUPPLEMENT Patient taking differently: Take 1 tablet by mouth daily. 06/15/22   [name], MD  gabapentin (NEURONTIN) 300 MG capsule Take 600 mg by mouth 3 (three) times daily.    [provider]  insulin glargine, 2 Unit Dial, (TOUJEO MAX) 300 UNIT/ML Solostar Pen Inject 30 Units into the skin daily. Patient taking differently: Inject 25 Units into the skin in the morning. 10/11/22   [name], MD  insulin lispro (HUMALOG) 100 UNIT/ML injection Continue as previously prescribed by [facility] endocrinologist depending on the scale Patient taking differently: See admin instructions. Continue as previously prescribed by [facility] endocrinologist [name],  inject into the skin three to four times a day, per sliding scale- up to a max of 50 units/day 03/28/20 04/26/23  [name], MD  lisinopril (ZESTRIL) 5 MG tablet Take 5 mg by mouth daily.    [provider]  Multiple Vitamin (MULTI-VITAMIN) tablet Take 1 tablet by mouth daily with breakfast. 08/21/18   [provider]  nicotine (NICODERM CQ - DOSED IN MG/24 HOURS) 14 mg/24hr patch Place 14 mg onto the skin daily.    [provider]  NON FORMULARY Take 1 capsule by mouth See admin instructions. UMZU Testro-X Testosterone Supplement for Men- Take 1 capsule by mouth once a day    [provider]  NON FORMULARY Take 1 capsule by mouth See admin instructions. Sea Moss- Take 1 capsule by mouth once a day    [provider]  NON FORMULARY Take 1-3 g by mouth See admin instructions. Pure Himalayan Shilajit- Mix 1-3 grams into a beverage and drink by mouth once a day to improve blood flow    [provider]  pantoprazole (PROTONIX) 40 MG tablet Take 1 tablet (40 mg total) by mouth daily. 04/28/23 05/28/23  [name], MD  tadalafil (CIALIS) 20 MG tablet Take 20 mg by mouth daily as needed for erectile dysfunction. 12/14/21   [provider]  traZODone (DESYREL) 50 MG tablet Take 50 mg by mouth at bedtime as needed for sleep. 11/25/20   [provider]  TYLENOL 500 MG tablet Take 500-1,000 mg by mouth every  6 (six) hours as needed for mild pain (pain score 1-3), moderate pain (pain score 4-6) or headache.    [provider]      Allergies    Patient has no known allergies.    Review of Systems   Review of Systems  Physical Exam Updated Vital Signs BP 134/84 (BP Location: Left Arm)   Pulse 89   Temp 97.7 F (36.5 C) (Oral)   Resp 18   Ht 5\' 11"  (1.803 m)   Wt 61.7 kg   SpO2 100%   BMI 18.97 kg/m  Physical Exam Vitals and nursing note reviewed.  Constitutional:      General: He is not in acute distress.     Appearance: He is well-developed. He is not diaphoretic.  HENT:     Head: Normocephalic and atraumatic.  Eyes:     General: No scleral icterus.    Conjunctiva/sclera: Conjunctivae normal.  Cardiovascular:     Rate and Rhythm: Normal rate and regular rhythm.     Heart sounds: Normal heart sounds.  Pulmonary:     Effort: Pulmonary effort is normal. No respiratory distress.     Breath sounds: Normal breath sounds.  Abdominal:     Palpations: Abdomen is soft.     Tenderness: There is no abdominal tenderness.  Musculoskeletal:     Cervical back: Normal range of motion and neck supple.     Comments: And has normal strength with dorsi and plantarflexion at the ankle.  No obvious bruising lacerations or contusion to the tailbone.  Tender to palp to palpation over the coccyx.  Skin:    General: Skin is warm and dry.  Neurological:     Mental Status: He is alert.  Psychiatric:        Behavior: Behavior normal.     ED Results / Procedures / Treatments   Labs (all labs ordered are listed, but only abnormal results are displayed) Labs Reviewed - No data to display  EKG None  Radiology DG Sacrum/Coccyx Result Date: 06/01/2023 CLINICAL DATA:  fall, tailbone pain EXAM: SACRUM AND COCCYX - 2+ VIEW COMPARISON:  X-ray sacrum coccyx 10/09/2022, CT abdomen pelvis 10/15/2022 FINDINGS: Limited evaluation due to overlapping osseous structures and overlying soft tissues. There is no evidence of fracture. Bilateral femoral head avascular necrosis. IMPRESSION: 1. Negative for acute traumatic injury. 2. Bilateral femoral head avascular necrosis. Electronically Signed   By: Tish Frederickson M.D.   On: 06/01/2023 20:54    Procedures Procedures    Medications Ordered in ED Medications - No data to display  ED Course/ Medical Decision Making/ A&P Clinical Course as of 06/01/23 2224  Fri Jun 01, 2023  2222 DG Sacrum/Coccyx [AH]    Clinical Course User Index [AH] Arthor Captain, PA-C                                  Medical Decision Making Amount and/or Complexity of Data Reviewed Radiology: ordered. Decision-making details documented in ED Course.  Risk OTC drugs. Prescription drug management.   Patient here with mechanical fall, tailbone injury.  I visualized and interpreted diagnostic coccyx imaging which shows no acute findings.  Patient is in significant pain.  Will prescribe a few Norco.  PDMP reviewed which shows persistent gabapentin use.  Advised Colace, fluids, fiber to make sure stools are soft, supportive care and return precautions discussed.        Final Clinical Impression(s) /  ED Diagnoses Final diagnoses:  None    Rx / DC Orders ED Discharge Orders     None         Arthor Captain, PA-C 06/01/23 2243    Tanda Rockers A, DO 06/03/23 2244

## 2023-06-01 NOTE — ED Triage Notes (Signed)
 Pt states fall today after losing balance, fell flat on buttocks/coccyx  Painful to sit

## 2023-10-27 ENCOUNTER — Institutional Professional Consult (permissible substitution) (HOSPITAL_COMMUNITY)

## 2023-10-27 ENCOUNTER — Institutional Professional Consult (permissible substitution)
Admission: EM | Admit: 2023-10-27 | Discharge: 2023-11-28 | Disposition: A | Discharge status: Skilled Nursing Facility | Attending: Internal Medicine | Admitting: Internal Medicine

## 2023-10-28 LAB — COMPREHENSIVE METABOLIC PANEL WITH GFR
ALT: 21 U/L (ref 0–44)
AST: 32 U/L (ref 15–41)
Albumin: 2.7 g/dL — ABNORMAL LOW (ref 3.5–5.0)
Alkaline Phosphatase: 105 U/L (ref 38–126)
Anion gap: 11 (ref 5–15)
BUN: 15 mg/dL (ref 6–20)
CO2: 25 mmol/L (ref 22–32)
Calcium: 9.7 mg/dL (ref 8.9–10.3)
Chloride: 100 mmol/L (ref 98–111)
Creatinine, Ser: 0.96 mg/dL (ref 0.61–1.24)
GFR, Estimated: >60 mL/min (ref >60)
Glucose, Bld: 252 mg/dL — ABNORMAL HIGH (ref 70–99)
Potassium: 4.4 mmol/L (ref 3.5–5.1)
Sodium: 136 mmol/L (ref 135–145)
Total Bilirubin: 0.3 mg/dL (ref 0.0–1.2)
Total Protein: 6.9 g/dL (ref 6.5–8.1)

## 2023-10-28 LAB — CBC WITH DIFFERENTIAL/PLATELET
Abs Immature Granulocytes: 0.04 K/uL (ref 0.00–0.07)
Basophils Absolute: 0.1 K/uL (ref 0.0–0.1)
Basophils Relative: 1 %
Eosinophils Absolute: 0.1 K/uL (ref 0.0–0.5)
Eosinophils Relative: 2 %
HCT: 33.7 % — ABNORMAL LOW (ref 39.0–52.0)
Hemoglobin: 11.3 g/dL — ABNORMAL LOW (ref 13.0–17.0)
Immature Granulocytes: 1 %
Lymphocytes Relative: 27 %
Lymphs Abs: 1.9 K/uL (ref 0.7–4.0)
MCH: 32.8 pg (ref 26.0–34.0)
MCHC: 33.5 g/dL (ref 30.0–36.0)
MCV: 98 fL (ref 80.0–100.0)
Monocytes Absolute: 1 K/uL (ref 0.1–1.0)
Monocytes Relative: 13 %
Neutro Abs: 4.1 K/uL (ref 1.7–7.7)
Neutrophils Relative %: 56 %
Platelets: 417 K/uL — ABNORMAL HIGH (ref 150–400)
RBC: 3.44 MIL/uL — ABNORMAL LOW (ref 4.22–5.81)
RDW: 12.4 % (ref 11.5–15.5)
WBC: 7.2 K/uL (ref 4.0–10.5)
nRBC: 0 % (ref 0.0–0.2)

## 2023-10-28 LAB — MAGNESIUM: Magnesium: 1.7 mg/dL (ref 1.7–2.4)

## 2023-10-31 ENCOUNTER — Institutional Professional Consult (permissible substitution) (HOSPITAL_COMMUNITY)

## 2023-10-31 NOTE — Progress Notes (Addendum)
 PATIENT INFORMATION   Patient Name: Carl Reed  Date of Birth: 07/27/1965  MRN: [id number]   Date of Admission: 10/27/2023  1:13 PM   Length of Stay: 4    Primary Care Physician: Elizbeth Blanch, MD   Attending Physician:  Dr. Corean Daring   SUBJECTIVE   Chief Complaint:  Altered mental status  Interval Summary:  The patient is a 58 year old male with history of type 1 diabetes,  HIV, hypertension, alcohol dependence, DKA, positive PPD, contact dermatitis, tobacco use disorder who was found unresponsive by his wife on 10/15/2023.  The patient was found to be profoundly hypoglycemic and required IV glucose administered by EMS.  He was intubated due to obtundation and low Glasgow coma scale.  Extensive workup including head CT, brain MRI, EEG, lumbar puncture were all negative for a specific etiology.  The patient was treated with IV thiamine  500 mg every 8 hours, CIWA protocol, Precedex drip with no significant improvement in mental status.  He was found to have bilateral ground-glass opacities on chest CT was felt to be due to inflammation versus infection and was treated with IV Rocephin and Zithromax.  He was subsequently extubated and maintained on room air.  The patient was maintained on Dobbhoff tube feedings.  He was continued on his HAART therapy.  The patient was transferred to [facility] for further management of nutrition via tube feeding, neurological monitoring, physical and occupational therapy.  Subjective: Patient seen and examined, no new complaint overnight  OBJECTIVE   Objective  Vital Signs:  BP (!) 162/96   Pulse 100   Temp 97.8 F (36.6 C) (Axillary)   Resp 16   Ht 5' 6 (1.676 m)   Wt 135 lb 9.6 oz (61.5 kg)   SpO2 96%   BMI 21.89 kg/m    I/O last 24 Hours:  Intake/Output Summary (Last 24 hours) at 10/31/2023 1040 Last data filed at 10/31/2023 0400 Gross per 24 hour  Intake 937 ml  Output 1550 ml  Net -613 ml     Physical  Exam:  GEN: WD WN male lying in bed sleeping no obvious distress HEENT: NCAT PERRL Conj clear Nares patent. Neck: Supple. No obvious JVD.  CV: RRR w/o obvious murmur Pulm: Nonlabored. Diminished B Bases.  ABD: Soft NT/ND.  MSK: No gross deformity.  Skin: Dry. Intact. No obvious rashes.  Neuro:  Unable to assess Extremities:  No clubbing cyanosis or edema  Labs:no new labs 10/31/23   Chest x-ray from 10/27/2023 shows 1. Feeding tube extends down to the stomach body 2.  Previously seen reticulonodular and bandlike opacities in the posterior basal segments of the lower lobes and not readily visible on today's x-ray.  This may indicate improvement or low conspicuity on chest x-ray as compared to CT. 3.  Substantial degenerative glenohumeral arthropathy on the left.  KUB from 10/27/23 bowel gas pattern is unremarkable.  Feeding tube ends any body of the stomach.         ASSESSMENT AND PLAN   Carl Reed is a 58 y.o. male  who was admitted on 10/27/2023 with Toxic metabolic encephalopathy [G92.8]   Assessment/Plan:    1) Toxic metabolic encephalopathy Cause not clear and likely multifactorial including hypoglycemia, infection, Wernicke's encephalopathy, or alcohol related brain injury. CT head, MRI brain, EEG, and LP were all negative. Ammonia level was 51. He was intubated due to mental status and treated with Precedex drip which was weaned off on 07/17.  He  was evaluated by Neurology. Continue supportive care with high dose Thiamine  500 mg IV every 8 hours thru 10/30/23  followed by Thiamine  100 mg daily, neuro checks, PT/OT and speech therapy.  Patient started on brain stimulating agent.  We will work on trying to get him out of restraints.   2)Sepsis due to community acquired pneumonia CT of C/A/P showed bilateral ground glass opacities. He was treated with IV Zithromax and IV Rocephin. Currently afbrile.  Recent chest x-ray shows improvement of pneumonia.  Plan to continue Rocephin 2  gram IV daily for 4 days--thru 07/29   3)Type I DM with hypoglycemia with hemoglobin A1c of 10.7  Per report was profoundly hypoglycemic in the field. Wife states he had been using a new device (iletBionic Pancreas) for 1 week but the cartridge remained empty on 10/15/23 and he had been using insulin  pens.  Accuchecks every 6 hours and SSI increased to moderate.    4)Dysphagia High risk for aspiration. S/P dobfoff. Continue tube feeds with Osmolite 1.5@50  ML/hour with free water flushes 30 mg every 4 hours.  We will consult Interventional Radiology for PEG tube placement.  The registered dietitian and speech therapy continued to follow up   5)Alcohol use disorder Family reports 8 cans of alcohol seltzer and 1 pint of of liquor daily. He was treated with Precedex for alcohol withdrawal and was weaned off this on 10/18/23. He seems anxious. Continue Thiamine , multivitamins, Folate along with PRN Ativan /Haldol for agitation.    Continue mittens as he may injure himself  6)HIV infection CD4 count is 581. Continue home Tivicay  and Epivir    7)Essential HTN  Will increase Coreg to 25 mg BID, and Lisinopril  20 mg daily   8)Dermatitis He was treated for rash in back and legs but none seen here.    9) Prevention  the patient is on Lovenox  for DVT prophylaxis and PPI for GI prophylaxis.      Code Status:  Full Resuscitation    Spent a total of 50 minutes on this encounter. This includes reviewing patient's extensive history, assessment and visit with the patient as well as documentation time. Time spent also includes IDT collaboration.    CELESTINO DELORES COREAN JONETTA, MD 10/28/23 1:44 PM

## 2023-11-01 LAB — COMPREHENSIVE METABOLIC PANEL WITH GFR
ALT: 18 U/L (ref 0–44)
AST: 28 U/L (ref 15–41)
Albumin: 2.8 g/dL — ABNORMAL LOW (ref 3.5–5.0)
Alkaline Phosphatase: 105 U/L (ref 38–126)
Anion gap: 10 (ref 5–15)
BUN: 17 mg/dL (ref 6–20)
CO2: 28 mmol/L (ref 22–32)
Calcium: 10 mg/dL (ref 8.9–10.3)
Chloride: 98 mmol/L (ref 98–111)
Creatinine, Ser: 0.97 mg/dL (ref 0.61–1.24)
GFR, Estimated: >60 mL/min (ref >60)
Glucose, Bld: 216 mg/dL — ABNORMAL HIGH (ref 70–99)
Potassium: 4.7 mmol/L (ref 3.5–5.1)
Sodium: 136 mmol/L (ref 135–145)
Total Bilirubin: 0.3 mg/dL (ref 0.0–1.2)
Total Protein: 7.2 g/dL (ref 6.5–8.1)

## 2023-11-01 LAB — CBC
HCT: 34.3 % — ABNORMAL LOW (ref 39.0–52.0)
Hemoglobin: 11.5 g/dL — ABNORMAL LOW (ref 13.0–17.0)
MCH: 33.2 pg (ref 26.0–34.0)
MCHC: 33.5 g/dL (ref 30.0–36.0)
MCV: 99.1 fL (ref 80.0–100.0)
Platelets: 488 K/uL — ABNORMAL HIGH (ref 150–400)
RBC: 3.46 MIL/uL — ABNORMAL LOW (ref 4.22–5.81)
RDW: 12.3 % (ref 11.5–15.5)
WBC: 6.1 K/uL (ref 4.0–10.5)
nRBC: 0 % (ref 0.0–0.2)

## 2023-11-01 NOTE — H&P (Addendum)
 Chief Complaint: Enteric nutrition needs - image guided percutaneous gastrostomy tube placement   Referring Provider(s): Delores Krabbe   Supervising Physician: Jennefer Rover  Patient Status: [facility] - In-pt  History of Present Illness: Carl Reed is a 58 y.o. male with history of HIV, diabetes, and hypertension.  [facility] and is in need of long term enteric nutrition.  Interventional radiology was consulted for possible gastrostomy tube placement.  Imaging was reviewed and approved by Dr. Jennefer 11/01/23 for image guided percutaneous gastrostomy tube placement.     Patient is Full Code  Past Medical History:  Diagnosis Date   Diabetes mellitus without complication (HCC)    HIV (human immunodeficiency virus infection) (HCC)    Hypertension     No past surgical history on file.  Allergies: Patient has no known allergies.  Medications: Prior to Admission medications   Medication Sig Start Date End Date Taking? Authorizing Provider  ARTIFICIAL TEARS PF 0.1-0.3 % SOLN Place 1 drop into both eyes 3 (three) times daily as needed (for dryness).    [provider]  Continuous Glucose Sensor (FREESTYLE LIBRE 3 PLUS SENSOR) MISC Inject 1 Device into the skin every 14 (fourteen) days.    [provider]  cyclobenzaprine (FLEXERIL) 10 MG tablet Take 10 mg by mouth daily as needed for muscle spasms. 07/25/22   [provider]  docusate sodium  (COLACE) 100 MG capsule Take 1 capsule (100 mg total) by mouth every 12 (twelve) hours. 06/01/23   Harris, Masato Pettie, PA-C  DOVATO  50-300 MG tablet TAKE 1 TABLET BY MOUTH ONCE DAILY. TAKE 2 HOURS BEFORE MULTIVITAMIN, SUCRALAFATE, AND FEEDING SUPPLEMENT Patient taking differently: Take 1 tablet by mouth daily. 06/15/22   Teresa Carrier, MD  gabapentin  (NEURONTIN ) 300 MG capsule Take 600 mg by mouth 3 (three) times daily.    [provider]  HYDROcodone -acetaminophen   (NORCO/VICODIN) 5-325 MG tablet Take 1 tablet by mouth every 6 (six) hours as needed. 06/01/23   Harris, Tereasa Yilmaz, PA-C  insulin  glargine, 2 Unit Dial , (TOUJEO  MAX) 300 UNIT/ML Solostar Pen Inject 30 Units into the skin daily. Patient taking differently: Inject 25 Units into the skin in the morning. 10/11/22   Odell Celinda Balo, MD  insulin  lispro (HUMALOG ) 100 UNIT/ML injection Continue as previously prescribed by [facility] endocrinologist depending on the scale Patient taking differently: See admin instructions. Continue as previously prescribed by [facility] endocrinologist Tobie, inject into the skin three to four times a day, per sliding scale- up to a max of 50 units/day 03/28/20 04/26/23  Will Almarie MATSU, MD  lisinopril  (ZESTRIL ) 5 MG tablet Take 5 mg by mouth daily.    [provider]  Multiple Vitamin (MULTI-VITAMIN) tablet Take 1 tablet by mouth daily with breakfast. 08/21/18   [provider]  nicotine  (NICODERM CQ  - DOSED IN MG/24 HOURS) 14 mg/24hr patch Place 14 mg onto the skin daily.    [provider]  NON FORMULARY Take 1 capsule by mouth See admin instructions. UMZU Testro-X Testosterone Supplement for Men- Take 1 capsule by mouth once a day    [provider]  NON FORMULARY Take 1 capsule by mouth See admin instructions. Sea Moss- Take 1 capsule by mouth once a day    [provider]  NON FORMULARY Take 1-3 g by mouth See admin instructions. Pure Himalayan Shilajit- Mix 1-3 grams into a beverage and drink by mouth once a day to improve blood flow  [provider]  pantoprazole  (PROTONIX ) 40 MG tablet Take 1 tablet (40 mg total) by mouth daily. 04/28/23 05/28/23  Christobal Guadalajara, MD  tadalafil (CIALIS) 20 MG tablet Take 20 mg by mouth daily as needed for erectile dysfunction. 12/14/21   [provider]  traZODone  (DESYREL ) 50 MG tablet Take 50 mg by mouth at bedtime as needed for sleep. 11/25/20   [provider]   TYLENOL  500 MG tablet Take 500-1,000 mg by mouth every 6 (six) hours as needed for mild pain (pain score 1-3), moderate pain (pain score 4-6) or headache.    [provider]     No family history on file.  Social History   Socioeconomic History   Marital status: Married    Spouse name: Not on file   Number of children: Not on file   Years of education: Not on file   Highest education level: Not on file  Occupational History   Not on file  Tobacco Use   Smoking status: Every Day    Current packs/day: 1.00    Types: Cigarettes   Smokeless tobacco: Never  Vaping Use   Vaping status: Every Day  Substance and Sexual Activity   Alcohol use: Yes    Alcohol/week: 4.0 standard drinks of alcohol    Types: 2 Cans of beer, 2 Shots of liquor per week   Drug use: No   Sexual activity: Not Currently    Birth control/protection: Abstinence  Other Topics Concern   Not on file  Social History Narrative   Not on file   Social Drivers of Health   Financial Resource Strain: Low Risk  (10/29/2023)   Received from [facility]   Overall Financial Resource Strain (CARDIA)    Difficulty of Paying Living Expenses: Not hard at all  Food Insecurity: No Food Insecurity (10/29/2023)   Received from [facility]   Hunger Vital Sign    Within the past 12 months, you worried that your food would run out before you got the money to buy more.: Never true    Within the past 12 months, the food you bought just didn't last and you didn't have money to get more.: Never true  Transportation Needs: Patient Unable To Answer (10/27/2023)   Received from [facility]   [facility] SDOH Transportation Source    Has lack of transportation kept you from medical appointments or from getting medications?: Unable to respond    Has lack of transportation kept you from meetings, work, or from getting things needed for daily living?: Unable to respond  Physical Activity: Not on file  Stress: Patient Unable To  Answer (10/27/2023)   Received from [facility]   [employer] of Occupational Health - Occupational Stress Questionnaire    Feeling of Stress : Patient unable to answer  Social Connections: Moderately Integrated (10/29/2023)   Received from [facility]   Social Connection and Isolation Panel    In a typical week, how many times do you talk on the phone with family, friends, or neighbors?: More than three times a week    How often do you get together with friends or relatives?: More than three times a week    How often do you attend church or religious services?: 1 to 4 times per year    Do you belong to any clubs or organizations such as church groups, unions, fraternal or athletic groups, or school groups?: No    How often do you attend meetings of the  clubs or organizations you belong to?: Never    Are you married, widowed, divorced, separated, never married, or living with a partner?: Married     Review of Systems: A 12 point ROS discussed and pertinent positives are indicated in the HPI above.  All other systems are negative.  Review of Systems  Reason unable to perform ROS: pt unable to answer questions.    Vital Signs: There were no vitals taken for this visit.  Advance Care Plan: The advanced care place/surrogate decision maker was discussed at the time of visit and the patient did not wish to discuss or was not able to name a surrogate decision maker or provide an advance care plan.  Physical Exam Constitutional:      Appearance: Normal appearance.  HENT:     Head: Normocephalic and atraumatic.     Mouth/Throat:     Mouth: Mucous membranes are moist.  Cardiovascular:     Rate and Rhythm: Normal rate and regular rhythm.  Pulmonary:     Effort: Pulmonary effort is normal.     Breath sounds: Normal breath sounds.  Abdominal:     General: Bowel sounds are normal.     Palpations: Abdomen is soft.  Musculoskeletal:        General: Normal range of motion.      Cervical back: Normal range of motion.  Skin:    General: Skin is warm and dry.  Neurological:     Mental Status: He is alert and oriented to person, place, and time.  Psychiatric:        Mood and Affect: Mood normal.        Behavior: Behavior normal.     Imaging: CT ABDOMEN WO CONTRAST Result Date: 10/31/2023 EXAM: CT ABDOMEN WITHOUT CONTRAST 10/31/2023 03:05:27 PM TECHNIQUE: CT of the abdomen was performed without the administration of intravenous contrast. Multiplanar reformatted images are provided for review. Automated exposure control, iterative reconstruction, and/or weight based adjustment of the mA/kV was utilized to reduce the radiation dose to as low as reasonably achievable. COMPARISON: 10/23/2023 CLINICAL HISTORY: Anatomy eval for possible G tube placement. FINDINGS: LOWER CHEST: Interval improvement in previously noted postinflammatory changes within the posterior lung bases. Decreased reticular and nodular interstitial densities within both lungs. Imaging findings are compatible with resolving inflammation or infection. HEPATOBILIARY: 1 cm gallstone identified, axial image 26. SPLEEN: Spleen demonstrates no acute abnormality. PANCREAS: Pancreas demonstrates no acute abnormality. ADRENAL GLANDS: Adrenal glands demonstrate no acute abnormality. KIDNEYS: No stones in the kidneys or proximal ureters. No hydronephrosis. No perinephric or periureteral stranding. GI AND BOWEL: The stomach is normal. There is a feeding tube within the gastric fundus. Dilute contrast is identified within the transverse and descending colon. A loop of bowel is identified between the anterior wall of the gastric fundus, lateral segment of left lobe of liver and ventral abdominal wall, sagittal image 69. PERITONEUM AND RETROPERITONEUM: No ascites or free air. Aorta is normal in caliber. Aortic atherosclerotic calcification. LYMPH NODES: No lymphadenopathy. BONES AND SOFT TISSUES: No acute abnormality of the  visualized bones. No focal soft tissue abnormality. IMPRESSION: 1. Interval improvement in previously noted postinflammatory changes within the posterior lung bases, compatible with resolving inflammation or infection. 2. 1 cm gallstone identified. 3. A loop of bowel is identified between the anterior wall of the gastric fundus, lateral segment of left lobe of liver, and ventral abdominal wall. Electronically signed by: Waddell Calk MD 10/31/2023 03:22 PM EDT RP Workstation: [id number]   DG CHEST PORT  1 VIEW Result Date: 10/27/2023 CLINICAL DATA:  Feeding tube placement.  Pneumonia. EXAM: PORTABLE CHEST 1 VIEW COMPARISON:  Chest CT 10/23/2023 FINDINGS: Feeding tube extends down to the stomach body. Previously seen reticulonodular and bandlike opacities in the posterior basal segments of the lower lobes are not readily visible on today's chest radiograph. This may indicate improvement, or low conspicuity on chest radiography as compared to CT. Cardiac and mediastinal margins appear normal. Old healed right lateral fourth rib fracture. Substantial degenerative glenohumeral arthropathy on the left. IMPRESSION: 1. Feeding tube extends down to the stomach body. 2. Previously seen reticulonodular and bandlike opacities in the posterior basal segments of the lower lobes are not readily visible on today's chest radiograph. This may indicate improvement, or low conspicuity on chest radiography as compared to CT. 3. Substantial degenerative glenohumeral arthropathy on the left. Electronically Signed   By: Ryan Salvage M.D.   On: 10/27/2023 16:22   DG Abd Portable 1V Result Date: 10/27/2023 CLINICAL DATA:  Feeding tube placement EXAM: PORTABLE ABDOMEN - 1 VIEW COMPARISON:  Abdominal CT 10/23/2023 FINDINGS: Feeding tube noted with tip in the stomach body. Taking into account the degree of obliquity, the bowel gas pattern is unremarkable. Mild lumbar spondylosis. IMPRESSION: 1. Feeding tube tip is in the stomach body.  Electronically Signed   By: Ryan Salvage M.D.   On: 10/27/2023 16:20    Labs:  CBC: Recent Labs    04/26/23 1050 04/27/23 0427 10/28/23 0433 11/01/23 0336  WBC 4.6 5.6 7.2 6.1  HGB 12.7* 12.0* 11.3* 11.5*  HCT 38.2* 36.5* 33.7* 34.3*  PLT 245 195 417* 488*    COAGS: No results for input(s): INR, APTT in the last 8760 hours.  BMP: Recent Labs    04/26/23 1050 04/27/23 0427 10/28/23 0433 11/01/23 0336  NA 133* 137 136 136  K 4.5 3.4* 4.4 4.7  CL 98 104 100 98  CO2 20* 23 25 28   GLUCOSE 280* 77 252* 216*  BUN 28* 18 15 17   CALCIUM  10.1 9.0 9.7 10.0  CREATININE 1.68* 1.02 0.96 0.97  GFRNONAA 48* >60 >60 >60    LIVER FUNCTION TESTS: Recent Labs    04/26/23 1050 04/27/23 0427 10/28/23 0433 11/01/23 0336  BILITOT 0.9 0.7 0.3 0.3  AST 29 31 32 28  ALT 38 30 21 18   ALKPHOS 95 78 105 105  PROT 7.5 6.2* 6.9 7.2  ALBUMIN 3.9 3.1* 2.7* 2.8*    TUMOR MARKERS: No results for input(s): AFPTM, CEA, CA199, CHROMGRNA in the last 8760 hours.  Assessment and Plan:  Pt is with Enteric nutrition needs scheduled for image guided percutaneous gastrostomy tube placement 11/02/23.  Imaging was reviewed and approved by Dr. Jennefer.    Risks and benefits image guided gastrostomy tube placement was discussed with the patient including, but not limited to the need for a barium enema during the procedure, bleeding, infection, peritonitis and/or damage to adjacent structures.  All of the patient's wife, Sabino Denning, questions were answered and is agreeable to proceed.  Consent obtained via phone from wife, Callie Bunyard and in IR.  Thank you for allowing our service to participate in [facility] 's care.  Electronically Signed: Lavanda JAYSON Jurist, PA-C   11/01/2023, 2:07 PM    I spent a total of  30 Minutes   in face to face in clinical consultation, greater than 50% of which was counseling/coordinating care for image guided percutaneous gastrostomy tube  placement.

## 2023-11-01 NOTE — Unmapped External Note (Signed)
 Patient will be started in Cognitive Rehab program.  He scored 4 on the [facility] today. See Flowsheet for details.

## 2023-11-01 NOTE — Unmapped External Note (Signed)
 Occupational Therapy      Treatment  Patient Name: Carl Reed Patient Birthdate: 1965-07-12       Pain Assessment Pain Context: Therapy Assessment Prior to Treatment (11/01/23 1551) Pain Assessment: CPOT (11/01/23 1551) Pain Score: 0 - No pain (11/01/23 1551) Pain Severity - NRS (Calculated): No pain (11/01/23 1551) Vital Signs Vitals Context: Resting (11/01/23 1551) Pulse: 79 (11/01/23 1551) Heart Rate Source: Monitor (11/01/23 1551) Resp: 15 (11/01/23 1551) SpO2: 100 % (11/01/23 1551) Critical-Care Pain Observation Tool  Facial Expression: Relaxed, neutral (11/01/23 1551) Body Movements: Absence of movements or normal position (11/01/23 1551) Vocalization (Extubated Patients): Talking in normal tone or no sound (11/01/23 1551) Muscle Tension: Relaxed (11/01/23 1551) Critical-Care Pain Observation Score: 0 (11/01/23 1551) Pain Severity - CPOT (Calculated): No pain (11/01/23 1551)       Cognition:    Overall Cognitive Status:  Impairments impacting function   Alert:  No (narrative)   Oriented to Personal Circumstances:  No (narrative)   Oriented to Place:  No (narrative)   Oriented to Time:  No (narrative)   Able to Follow 1 Step Commands:  No (narrative)   Able to Follow 2 Step Commands:  No (narrative)   Arousal:  Unarousable       ROM:  Yes R Motion All Joints:  Passive range of motion R Position All Joints:  Supine and Against gravity R Weight/Reps/Sets All Joints:  10x L Motion All Joints:  Passive range of motion L Position All Joints:  Supine and Against gravity L Weight/Reps/Sets All Joints:  10x                               Fall Prevention Recommendations:    Fall Prevention/ Other Recommendations:  Low bed, bed alarm, chair alarm   Communicated Via:  Verbal instruction with staff and Patient/family education Outcomes:    OT Summary Comments:  Pt found supine in bed with mittens on and unable to arouse with verbal and tactile  stimuli. Mittens removed for therapy and replaced at end of session. Pt tolerated PROM for BUE in all planes of motion with noted resistance with shoulder flexion and elbow extension. Pt's vitals remain WNL throughout session. Pt left supine in bed with HOB elevated, call bell, bed alarm, and mittens in place.         Pain Evaluation and Follow-up Pain Reassessment: 0, No pain (11/01/23 1551) Nursing notified of patient's pain assessment: Not indicated - pain score 2 or less (11/01/23 1551)      Therapy Minutes   Enter Time Entry for Time Based Charges Here: Time In : 1551 Time Out: 1602 Total Time with Patient (Min): 11 min Therapeutic Exercise: 11 Total Time to be Billed (Min): 11     BURZLER, MELISSA, OT 11/01/2023

## 2023-11-01 NOTE — Progress Notes (Addendum)
 PATIENT INFORMATION   Patient Name: Carl Reed  Date of Birth: [birth year]  MRN: [id number]   Date of Admission: 10/27/2023  1:13 PM   Length of Stay: 5    Primary Care Physician: Elizbeth Blanch, MD   Attending Physician:  Dr. Corean Daring   SUBJECTIVE   Chief Complaint:  Altered mental status  Interval Summary:  The patient is a 58 year old male with history of type 1 diabetes,  HIV, hypertension, alcohol dependence, DKA, positive PPD, contact dermatitis, tobacco use disorder who was found unresponsive by his wife on 10/15/2023.  The patient was found to be profoundly hypoglycemic and required IV glucose administered by EMS.  He was intubated due to obtundation and low Glasgow coma scale.  Extensive workup including head CT, brain MRI, EEG, lumbar puncture were all negative for a specific etiology.  The patient was treated with IV thiamine  500 mg every 8 hours, CIWA protocol, Precedex drip with no significant improvement in mental status.  He was found to have bilateral ground-glass opacities on chest CT was felt to be due to inflammation versus infection and was treated with IV Rocephin and Zithromax.  He was subsequently extubated and maintained on room air.  The patient was maintained on Dobbhoff tube feedings.  He was continued on his HAART therapy.  The patient was transferred to [facility] for further management of nutrition via tube feeding, neurological monitoring, physical and occupational therapy.  Subjective: Patient seen and examined, no new complaint overnight. Daughter in from [place] at the bedside.  States that when she started playing music he opened his eyes  OBJECTIVE   Objective  Vital Signs:  BP (!) 156/90   Pulse 85   Temp 97.6 F (36.4 C)   Resp 18   Ht 5' 6 (1.676 m)   Wt 138 lb 6.4 oz (62.8 kg)   SpO2 99%   BMI 22.34 kg/m    I/O last 24 Hours:  Intake/Output Summary (Last 24 hours) at 11/01/2023 1429 Last data filed at 11/01/2023  0800 Gross per 24 hour  Intake 1572 ml  Output 1025 ml  Net 547 ml     Physical Exam:  GEN: WD WN male lying in bed  no obvious distress HEENT: NCAT PERRL Conj clear Nares patent.NGT intact Neck: Supple. No obvious JVD.  CV: RRR w/o obvious murmur Pulm: Nonlabored. Diminished B Bases.  ABD: Soft NT/ND.  MSK: No gross deformity.  Skin: Dry. Intact. No obvious rashes.  Neuro:  Encephalopathic Extremities:  No clubbing cyanosis or edema  Labs:  Sodium 136 potassium 4.7 chloride 98 CO2 28 BUN 17 creatinine 0.97 glucose 216 LFTs negative WBC 6.1 hemoglobin 11.5 hematocrit 34.3 platelets 488   Chest x-ray from 10/27/2023 shows 1. Feeding tube extends down to the stomach body 2.  Previously seen reticulonodular and bandlike opacities in the posterior basal segments of the lower lobes and not readily visible on today's x-ray.  This may indicate improvement or low conspicuity on chest x-ray as compared to CT. 3.  Substantial degenerative glenohumeral arthropathy on the left.  KUB from 10/27/23 bowel gas pattern is unremarkable.  Feeding tube ends any body of the stomach.         ASSESSMENT AND PLAN   Carl Reed is a 58 y.o. male  who was admitted on 10/27/2023 with Toxic metabolic encephalopathy [G92.8]   Assessment/Plan:    1) Toxic metabolic encephalopathy Cause not clear and likely multifactorial including hypoglycemia, infection, Wernicke's encephalopathy,  or alcohol related brain injury. CT head, MRI brain, EEG, and LP were all negative. Ammonia level was 51. He was intubated due to mental status and treated with Precedex drip which was weaned off on 07/17.  He was evaluated by Neurology. Continue supportive care with high dose Thiamine  500 mg IV every 8 hours thru 10/30/23  followed by Thiamine  100 mg daily, neuro checks, PT/OT and speech therapy.  Patient started on brain stimulating agent. Continue tol work on trying to get him out of restraints.   2)Sepsis due to community  acquired pneumonia CT of C/A/P showed bilateral ground glass opacities. He was treated with IV Zithromax and IV Rocephin. Currently afbrile.  Recent chest x-ray shows improvement of pneumonia.  Plan to continue Rocephin 2 gram IV daily for 4 days--thru 07/29   3)Type I DM with hypoglycemia with hemoglobin A1c of 10.7  Per report was profoundly hypoglycemic in the field. Wife states he had been using a new device (iletBionic Pancreas) for 1 week but the cartridge remained empty on 10/15/23 and he had been using insulin  pens.  Accuchecks every 6 hours and SSI increased to moderate.  Patient remains on 20 units of Lantus  q.h.s. we will increase to b.i.d.   4)Dysphagia High risk for aspiration. S/P dobfoff. Continue tube feeds with Osmolite 1.5@50  ML/hour with free water flushes 30 mg every 4 hours.  Patient is scheduled for PEG tube placement tomorrow.  The registered dietitian and speech therapy continued to follow up   5)Alcohol use disorder Family reports 8 cans of alcohol seltzer and 1 pint of of liquor daily. He was treated with Precedex for alcohol withdrawal and was weaned off this on 10/18/23. He seems anxious. Continue Thiamine , multivitamins, Folate along with PRN Ativan /Haldol for agitation.    Continue mittens as he may injure himself  6)HIV infection CD4 count is 581. Continue home Tivicay  and Epivir    7)Essential HTN  Will increase Coreg to 25 mg BID, and Lisinopril  20 mg daily   8)Dermatitis He was treated for rash in back and legs but none seen here.    9) Prevention  the patient is on Lovenox  for DVT prophylaxis and PPI for GI prophylaxis.      Code Status:  Full Resuscitation   Patient's wife was given an update Spent a total of 50 minutes on this encounter. This includes reviewing patient's extensive history, assessment and visit with the patient as well as documentation time. Time spent also includes IDT collaboration.    Carl DELORES COREAN JONETTA, MD 10/28/23 1:44  PM

## 2023-11-01 NOTE — Unmapped External Note (Signed)
 Speech Language Pathologist          Treatment  Patient Name: Carl Reed Patient Birthdate: 12/18/1965   Patient Subjective Report -    Patient is part of the Cognitive Rehab Program?: Yes (11/01/23 1121) CRS Total: 4 (11/01/23 1040)      Pain Assessment Pain Context: Post Intervention (11/01/23 1112) Pain Assessment: CPOT (11/01/23 1112) [place] Agitation Sedation Scale (RASS)/CAM-S: Moderate sedation (Movement or eye opening to voice but no eye contact)/stuporous (11/01/23 1040) Critical-Care Pain Observation Tool  Facial Expression: Relaxed, neutral (11/01/23 1040) Body Movements: Absence of movements or normal position (11/01/23 1040) Vocalization (Extubated Patients): Talking in normal tone or no sound (11/01/23 1040) Muscle Tension: Relaxed (11/01/23 1040) Critical-Care Pain Observation Score: 0 (11/01/23 1040) Pain Severity - CPOT (Calculated): No pain (11/01/23 1040) [place] Agitation Sedation Scale (RASS)/CAM-S: Moderate sedation (Movement or eye opening to voice but no eye contact)/stuporous (11/01/23 1040)       Daily Treatment:  Communication Comments:  Dependent on others to anticipate needs. Cognitive Comments:  JFK administered, and the patient scored 4/23. He is appropriate for cognitive rehabilitation program. : Room air. Dentition and Oral Health Status:  Compromised oral health and Sparse or partial dentition Recommended Diet:  NPO for Solids and NPO for Liquids - No Liquids by Mouth Compensatory Swallowing Techniques/Strategies:  N/A due to NPO status           Coma Recovery Scale - Revised  Auditory Function Scale: None (11/01/23 1040) Visual Function Scale: None (11/01/23 1040) Motor function scale: Flexion withdrawal (11/01/23 1040) Oromotor - Verbal Function Scale: Oral reflexive movement (11/01/23 1040) Communication Scale: None (11/01/23 1040) Arousal Scale: Eye opening with stimulation (11/01/23 1040) CRS Total: 4 (11/01/23  1040)                      Pain Evaluation and Follow-up Pain Reassessment: 0, No pain (11/01/23 1112) Nursing notified of patient's pain assessment: Not indicated - pain score 2 or less (11/01/23 1112)    Therapy Minutes Time In : 1040 Time Out: 1112 Time calculation (min): 32 min          STURGILL, SAUNDRA L., CCC-SLP 11/01/2023

## 2023-11-02 ENCOUNTER — Institutional Professional Consult (permissible substitution) (HOSPITAL_COMMUNITY)

## 2023-11-02 NOTE — Unmapped External Note (Signed)
  Problem: Infection Goal: Absence of infection and prevention of transmission during hospitalization Outcome: Progressing   Problem: Knowledge Deficit Goal: Patient and/or family demonstrate readiness to learn Outcome: Progressing Goal: Patient and/or family verbalizes understanding of education, and/or performs desired skill Outcome: Progressing   Problem: Discharge Planning Goal: Discharge to home or other facility with appropriate resources Outcome: Progressing Goal: Care giver will develop a plan to decrease their burden and enhance comfort in role Outcome: Progressing   Problem: Delirium Goal: Prevent and Manage Delirium Outcome: Progressing   Problem: Fall Safety: Universal Precautions Goal: Free from fall injury Outcome: Progressing Goal: Toilet Magnet Status ([facility] Only) Outcome: Progressing   Problem: Fall Prevention: Medication Bundle Goal: Patient will be free from Fall Injury Outcome: Progressing   Problem: Fall Prevention: Continence Bundle Goal: Patient will be free from Fall Injury Outcome: Progressing   Problem: Fall Prevention: Environment/Sensory Bundle Goal: Patient will be free from Fall Injury Outcome: Progressing   Problem: Fall Prevention Across Bundles Goal: Patient will be free from Fall Injury Outcome: Progressing   Problem: Non-Violent/Non-Self-Destructive Behavior Goal: Remains Free of Injury from Restraints (Restraint for Safety or Interference with Medical Device) Outcome: Progressing Goal: Free from Restraint (s) (Restraint for Safety or Interference with Medical Device) Outcome: Progressing   Problem: Potential for Compromised Skin Integrity Goal: Skin integrity is maintained or improved Outcome: Progressing Goal: Nutritional status is improving Outcome: Progressing   Problem: Urinary Incontinence Goal: Perineal skin integrity is maintained or improved Outcome: Progressing   Problem: Bowel Incontinence Goal: Perineal Skin  Integrity is Maintained or Improved Outcome: Progressing

## 2023-11-04 NOTE — Progress Notes (Signed)
 PATIENT INFORMATION   Patient Name: Carl Reed  Date of Birth: [birth year]  MRN: [id number]   Date of Admission: 10/27/2023  1:13 PM   Length of Stay: 8    Primary Care Physician: Carl Blanch, MD   Attending Physician:  Dr. Corean Daring   SUBJECTIVE   Chief Complaint:  Altered mental status  Interval Summary:  The patient is a 58 year old male with history of type 1 diabetes,  HIV, hypertension, alcohol dependence, DKA, positive PPD, contact dermatitis, tobacco use disorder who was found unresponsive by his wife on 10/15/2023.  The patient was found to be profoundly hypoglycemic and required IV glucose administered by EMS.  He was intubated due to obtundation and low Glasgow coma scale.  Extensive workup including head CT, brain MRI, EEG, lumbar puncture were all negative for a specific etiology.  The patient was treated with IV thiamine  500 mg every 8 hours, CIWA protocol, Precedex drip with no significant improvement in mental status.  He was found to have bilateral ground-glass opacities on chest CT was felt to be due to inflammation versus infection and was treated with IV Rocephin and Zithromax.  He was subsequently extubated and maintained on room air.  The patient was maintained on Dobbhoff tube feedings.  He was continued on his HAART therapy.  The patient was [facility] to [facility] for further management of nutrition via tube feeding, neurological monitoring, physical and occupational therapy.  Subjective:  The patient remains on room air.  He is alert and oriented x1.  He remains with Mitten restraints due to pulling at lines and tubes.  He is on NG tube feeds at this time.   OBJECTIVE   Objective  Vital Signs:  BP 142/84   Pulse 102   Temp 97.8 F (36.6 C) (Axillary)   Resp 19   Ht 5' 6 (1.676 m)   Wt 133 lb (60.3 kg)   SpO2 98%   BMI 21.47 kg/m    I/O last 24 Hours:  Intake/Output Summary (Last 24 hours) at 11/04/2023 1214 Last data filed  at 11/04/2023 0636 Gross per 24 hour  Intake 642 ml  Output 800 ml  Net -158 ml     Physical Exam:  GEN: WD WN male lying in bed  no obvious distress HEENT: NCAT PERRL Conj clear Nares patent.NGT intact Neck: Supple. No obvious JVD.  CV: RRR w/o obvious murmur Pulm: Nonlabored. Diminished B Bases.  ABD: Soft NT/ND.  MSK: No gross deformity.  Skin: Dry. Intact. No obvious rashes.  Neuro:  Encephalopathic Extremities:  No clubbing cyanosis or edema  Labs:    No new labs  Xrays:  Abdominal x-ray 11/02/2023 shows a normal bowel gas pattern, stable from recent CT. Feeding tube terminates just inside the stomach as on recent CT.  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 Feeding tube extends down to the stomach body 2.  Previously seen reticulonodular and bandlike opacities in the posterior basal segments of the lower lobes and not readily visible on today's x-ray.  This may indicate improvement or low conspicuity on chest x-ray as compared to CT. 3.  Substantial degenerative glenohumeral arthropathy on the left.  KUB from 10/27/23 bowel gas pattern is unremarkable.  Feeding tube ends any body of the stomach.         ASSESSMENT AND PLAN   Carl Reed is a 58 y.o. male  who was admitted on 10/27/2023  with Toxic metabolic encephalopathy [G92.8]   Assessment/Plan:    1) Toxic metabolic encephalopathy Cause not clear and likely multifactorial including hypoglycemia, infection, Wernicke's encephalopathy, or alcohol related brain injury. CT head, MRI brain, EEG, and LP were all negative. Ammonia level was 51. He was intubated due to mental status and treated with Precedex drip which was weaned off on 07/17.  He was evaluated by Neurology. Continue supportive care with high dose Thiamine  500 mg IV every 8 hours thru 10/30/23  followed by Thiamine  100 mg daily, neuro checks, PT/OT and speech therapy.  Patient started on brain  stimulating agent.  We will continue to work on trying to get him out of restraints.   2)Sepsis due to community acquired pneumonia CT of C/A/P showed bilateral ground glass opacities. He was treated with IV Zithromax and IV Rocephin. Currently afbrile.  Recent chest x-ray shows improvement of pneumonia.  The patient completed a course of Rocephin on 07/29.   3)Type I DM with hypoglycemia with hemoglobin A1c of 10.7  Per report was profoundly hypoglycemic in the field. Wife states he had been using a new device (iletBionic Pancreas) for 1 week but the cartridge remained empty on 10/15/23 and he had been using insulin  pens.  Accuchecks every 6 hours and SSI increased to moderate.  Patient remains on 20 units of Lantus  where has been increased to 20 units b.i.d..  I am going to increase his Lantus  once again and I may start him on scheduled short-acting for tube feeds.  4)Dysphagia High risk for aspiration. S/P dobfoff. Continue tube feeds with Osmolite 1.5@50  ML/hour with free water flushes 30 mg every 4 hours.  Patient is scheduled for PEG tube placement but this is post poned until Monday.  The registered dietitian and speech therapy we will continue to follow up.  We will continue on NG tube feeds at this time until he goes for his PEG on Monday.  The nursing supervisor states she is handling getting the contrast for the PEG tube tomorrow.    5)Alcohol use disorder Family reports 8 cans of alcohol seltzer and 1 pint of of liquor daily. He was treated with Precedex for alcohol withdrawal and was weaned off this on 10/18/23. He seems anxious. Continue Thiamine , multivitamins, Folate along with PRN Ativan /Haldol for agitation.    Continue mittens as he may injure himself  6)HIV infection CD4 count is 581. Continue home Tivicay  and Epivir    7)Essential HTN  Will increase Coreg to 25 mg BID, and Lisinopril  20 mg daily   8)Dermatitis He was treated for rash in back and legs but none seen here.    9)  Prevention  the patient is on Lovenox  for DVT prophylaxis and PPI for GI prophylaxis.      Code Status:  Full Resuscitation   Spent a total of 30 minutes on this encounter. This includes reviewing patient's extensive history, assessment and visit with the patient as well as documentation time. Time spent also includes IDT collaboration.    SIGNATURE   VAN EYK, JASON J, MD 10/28/23 1:44 PM

## 2023-11-05 ENCOUNTER — Institutional Professional Consult (permissible substitution) (HOSPITAL_COMMUNITY)

## 2023-11-05 HISTORY — PX: IR GASTROSTOMY TUBE MOD SED: IMG625

## 2023-11-05 LAB — CBC WITH DIFFERENTIAL/PLATELET
Abs Immature Granulocytes: 0.01 K/uL (ref 0.00–0.07)
Basophils Absolute: 0 K/uL (ref 0.0–0.1)
Basophils Relative: 0 %
Eosinophils Absolute: 0.2 K/uL (ref 0.0–0.5)
Eosinophils Relative: 3 %
HCT: 35.7 % — ABNORMAL LOW (ref 39.0–52.0)
Hemoglobin: 11.6 g/dL — ABNORMAL LOW (ref 13.0–17.0)
Immature Granulocytes: 0 %
Lymphocytes Relative: 32 %
Lymphs Abs: 1.8 K/uL (ref 0.7–4.0)
MCH: 32.5 pg (ref 26.0–34.0)
MCHC: 32.5 g/dL (ref 30.0–36.0)
MCV: 100 fL (ref 80.0–100.0)
Monocytes Absolute: 1 K/uL (ref 0.1–1.0)
Monocytes Relative: 17 %
Neutro Abs: 2.6 K/uL (ref 1.7–7.7)
Neutrophils Relative %: 48 %
Platelets: 390 K/uL (ref 150–400)
RBC: 3.57 MIL/uL — ABNORMAL LOW (ref 4.22–5.81)
RDW: 12.6 % (ref 11.5–15.5)
WBC: 5.5 K/uL (ref 4.0–10.5)
nRBC: 0 % (ref 0.0–0.2)

## 2023-11-05 LAB — COMPREHENSIVE METABOLIC PANEL WITH GFR
ALT: 21 U/L (ref 0–44)
AST: 25 U/L (ref 15–41)
Albumin: 2.9 g/dL — ABNORMAL LOW (ref 3.5–5.0)
Alkaline Phosphatase: 115 U/L (ref 38–126)
Anion gap: 12 (ref 5–15)
BUN: 60 mg/dL — ABNORMAL HIGH (ref 6–20)
CO2: 25 mmol/L (ref 22–32)
Calcium: 10.2 mg/dL (ref 8.9–10.3)
Chloride: 105 mmol/L (ref 98–111)
Creatinine, Ser: 1.8 mg/dL — ABNORMAL HIGH (ref 0.61–1.24)
GFR, Estimated: 44 mL/min — ABNORMAL LOW (ref >60)
Glucose, Bld: 43 mg/dL — CL (ref 70–99)
Potassium: 4.6 mmol/L (ref 3.5–5.1)
Sodium: 142 mmol/L (ref 135–145)
Total Bilirubin: 0.6 mg/dL (ref 0.0–1.2)
Total Protein: 7.1 g/dL (ref 6.5–8.1)

## 2023-11-05 LAB — PROTIME-INR
INR: 1 (ref 0.8–1.2)
Prothrombin Time: 13.7 s (ref 11.4–15.2)

## 2023-11-05 MED ORDER — IOHEXOL 300 MG/ML  SOLN
50.0000 mL | Freq: Once | INTRAMUSCULAR | Status: AC | PRN
  Administered 2023-11-05: 15 mL

## 2023-11-05 MED ORDER — CEFAZOLIN SODIUM-DEXTROSE 1-4 GM/50ML-% IV SOLN
INTRAVENOUS | Status: AC | PRN
  Administered 2023-11-05: 2 g via INTRAVENOUS

## 2023-11-05 MED ORDER — GLUCAGON HCL RDNA (DIAGNOSTIC) 1 MG IJ SOLR
INTRAMUSCULAR | Status: AC | PRN
  Administered 2023-11-05: .5 mg via INTRAVENOUS

## 2023-11-05 MED ORDER — MIDAZOLAM HCL 2 MG/2ML IJ SOLN
INTRAMUSCULAR | Status: AC | PRN
  Administered 2023-11-05: .5 mg via INTRAVENOUS
  Administered 2023-11-05: 1 mg via INTRAVENOUS

## 2023-11-05 MED ORDER — FENTANYL CITRATE (PF) 100 MCG/2ML IJ SOLN
INTRAMUSCULAR | Status: AC | PRN
  Administered 2023-11-05 (×2): 25 ug via INTRAVENOUS

## 2023-11-05 NOTE — Procedures (Signed)
  Procedure:  87F gastrostomy tube placement under fluoro  ` Preprocedure diagnosis: Needs enteral feeding support Postprocedure diagnosis: same EBL:    minimal Complications:   none immediate  See full dictation in [employer].  CHARM Toribio Faes MD Main # [phone] Pager  [phone] Mobile [phone]

## 2023-11-05 NOTE — Sedation Documentation (Addendum)
 Right lower PIV leaking, removed by IR RN. New PIV placed, see LDA.

## 2023-11-06 LAB — BASIC METABOLIC PANEL WITH GFR
Anion gap: 9 (ref 5–15)
BUN: 35 mg/dL — ABNORMAL HIGH (ref 6–20)
CO2: 25 mmol/L (ref 22–32)
Calcium: 9.6 mg/dL (ref 8.9–10.3)
Chloride: 103 mmol/L (ref 98–111)
Creatinine, Ser: 1.19 mg/dL (ref 0.61–1.24)
GFR, Estimated: >60 mL/min (ref >60)
Glucose, Bld: 176 mg/dL — ABNORMAL HIGH (ref 70–99)
Potassium: 5.3 mmol/L — ABNORMAL HIGH (ref 3.5–5.1)
Sodium: 137 mmol/L (ref 135–145)

## 2023-11-07 LAB — BASIC METABOLIC PANEL WITH GFR
Anion gap: 11 (ref 5–15)
BUN: 20 mg/dL (ref 6–20)
CO2: 22 mmol/L (ref 22–32)
Calcium: 9.3 mg/dL (ref 8.9–10.3)
Chloride: 103 mmol/L (ref 98–111)
Creatinine, Ser: 0.98 mg/dL (ref 0.61–1.24)
GFR, Estimated: >60 mL/min (ref >60)
Glucose, Bld: 239 mg/dL — ABNORMAL HIGH (ref 70–99)
Potassium: 4.3 mmol/L (ref 3.5–5.1)
Sodium: 136 mmol/L (ref 135–145)

## 2023-11-08 LAB — BASIC METABOLIC PANEL WITH GFR
Anion gap: 11 (ref 5–15)
BUN: 15 mg/dL (ref 6–20)
CO2: 24 mmol/L (ref 22–32)
Calcium: 9.5 mg/dL (ref 8.9–10.3)
Chloride: 98 mmol/L (ref 98–111)
Creatinine, Ser: 1.07 mg/dL (ref 0.61–1.24)
GFR, Estimated: >60 mL/min (ref >60)
Glucose, Bld: 314 mg/dL — ABNORMAL HIGH (ref 70–99)
Potassium: 4.6 mmol/L (ref 3.5–5.1)
Sodium: 133 mmol/L — ABNORMAL LOW (ref 135–145)

## 2023-11-12 LAB — CBC WITH DIFFERENTIAL/PLATELET
Abs Immature Granulocytes: 0.02 K/uL (ref 0.00–0.07)
Basophils Absolute: 0 K/uL (ref 0.0–0.1)
Basophils Relative: 1 %
Eosinophils Absolute: 0.2 K/uL (ref 0.0–0.5)
Eosinophils Relative: 5 %
HCT: 34.4 % — ABNORMAL LOW (ref 39.0–52.0)
Hemoglobin: 11.4 g/dL — ABNORMAL LOW (ref 13.0–17.0)
Immature Granulocytes: 0 %
Lymphocytes Relative: 35 %
Lymphs Abs: 1.8 K/uL (ref 0.7–4.0)
MCH: 32.9 pg (ref 26.0–34.0)
MCHC: 33.1 g/dL (ref 30.0–36.0)
MCV: 99.1 fL (ref 80.0–100.0)
Monocytes Absolute: 0.4 K/uL (ref 0.1–1.0)
Monocytes Relative: 8 %
Neutro Abs: 2.7 K/uL (ref 1.7–7.7)
Neutrophils Relative %: 51 %
Platelets: 305 K/uL (ref 150–400)
RBC: 3.47 MIL/uL — ABNORMAL LOW (ref 4.22–5.81)
RDW: 12.5 % (ref 11.5–15.5)
WBC: 5.2 K/uL (ref 4.0–10.5)
nRBC: 0 % (ref 0.0–0.2)

## 2023-11-12 LAB — BASIC METABOLIC PANEL WITH GFR
Anion gap: 11 (ref 5–15)
BUN: 39 mg/dL — ABNORMAL HIGH (ref 6–20)
CO2: 25 mmol/L (ref 22–32)
Calcium: 9.8 mg/dL (ref 8.9–10.3)
Chloride: 101 mmol/L (ref 98–111)
Creatinine, Ser: 1.59 mg/dL — ABNORMAL HIGH (ref 0.61–1.24)
GFR, Estimated: 51 mL/min — ABNORMAL LOW (ref >60)
Glucose, Bld: 295 mg/dL — ABNORMAL HIGH (ref 70–99)
Potassium: 5.1 mmol/L (ref 3.5–5.1)
Sodium: 137 mmol/L (ref 135–145)

## 2023-11-17 LAB — CBC
HCT: 32.8 % — ABNORMAL LOW (ref 39.0–52.0)
Hemoglobin: 10.7 g/dL — ABNORMAL LOW (ref 13.0–17.0)
MCH: 32 pg (ref 26.0–34.0)
MCHC: 32.6 g/dL (ref 30.0–36.0)
MCV: 98.2 fL (ref 80.0–100.0)
Platelets: 380 K/uL (ref 150–400)
RBC: 3.34 MIL/uL — ABNORMAL LOW (ref 4.22–5.81)
RDW: 12.3 % (ref 11.5–15.5)
WBC: 6.4 K/uL (ref 4.0–10.5)
nRBC: 0 % (ref 0.0–0.2)

## 2023-11-17 LAB — BASIC METABOLIC PANEL WITH GFR
Anion gap: 11 (ref 5–15)
Anion gap: 16 — ABNORMAL HIGH (ref 5–15)
BUN: 52 mg/dL — ABNORMAL HIGH (ref 6–20)
BUN: 56 mg/dL — ABNORMAL HIGH (ref 6–20)
CO2: 25 mmol/L (ref 22–32)
CO2: 20 mmol/L — ABNORMAL LOW (ref 22–32)
Calcium: 9.9 mg/dL (ref 8.9–10.3)
Calcium: 10 mg/dL (ref 8.9–10.3)
Chloride: 101 mmol/L (ref 98–111)
Chloride: 103 mmol/L (ref 98–111)
Creatinine, Ser: 1.75 mg/dL — ABNORMAL HIGH (ref 0.61–1.24)
Creatinine, Ser: 1.83 mg/dL — ABNORMAL HIGH (ref 0.61–1.24)
GFR, Estimated: 45 mL/min — ABNORMAL LOW (ref >60)
GFR, Estimated: 43 mL/min — ABNORMAL LOW (ref >60)
Glucose, Bld: 180 mg/dL — ABNORMAL HIGH (ref 70–99)
Glucose, Bld: 131 mg/dL — ABNORMAL HIGH (ref 70–99)
Potassium: 5.2 mmol/L — ABNORMAL HIGH (ref 3.5–5.1)
Potassium: 5.1 mmol/L (ref 3.5–5.1)
Sodium: 137 mmol/L (ref 135–145)
Sodium: 139 mmol/L (ref 135–145)

## 2023-11-17 LAB — MAGNESIUM: Magnesium: 1.9 mg/dL (ref 1.7–2.4)

## 2023-11-18 LAB — BASIC METABOLIC PANEL WITH GFR
Anion gap: 9 (ref 5–15)
BUN: 47 mg/dL — ABNORMAL HIGH (ref 6–20)
CO2: 23 mmol/L (ref 22–32)
Calcium: 9.5 mg/dL (ref 8.9–10.3)
Chloride: 103 mmol/L (ref 98–111)
Creatinine, Ser: 1.62 mg/dL — ABNORMAL HIGH (ref 0.61–1.24)
GFR, Estimated: 50 mL/min — ABNORMAL LOW (ref >60)
Glucose, Bld: 272 mg/dL — ABNORMAL HIGH (ref 70–99)
Potassium: 5.1 mmol/L (ref 3.5–5.1)
Sodium: 135 mmol/L (ref 135–145)

## 2023-11-19 LAB — HEPATITIS B SURFACE ANTIGEN: Hepatitis B Surface Ag: NONREACTIVE

## 2023-11-19 LAB — HIV ANTIBODY (ROUTINE TESTING W REFLEX): HIV Screen 4th Generation wRfx: REACTIVE — AB

## 2023-11-19 LAB — HEPATITIS C ANTIBODY: HCV Ab: NONREACTIVE

## 2023-11-19 LAB — HEPATITIS B CORE ANTIBODY, IGM: Hep B C IgM: NONREACTIVE

## 2023-11-20 LAB — BASIC METABOLIC PANEL WITH GFR
Anion gap: 9 (ref 5–15)
BUN: 19 mg/dL (ref 6–20)
CO2: 23 mmol/L (ref 22–32)
Calcium: 9.4 mg/dL (ref 8.9–10.3)
Chloride: 107 mmol/L (ref 98–111)
Creatinine, Ser: 1.12 mg/dL (ref 0.61–1.24)
GFR, Estimated: >60 mL/min (ref >60)
Glucose, Bld: 214 mg/dL — ABNORMAL HIGH (ref 70–99)
Potassium: 4.6 mmol/L (ref 3.5–5.1)
Sodium: 139 mmol/L (ref 135–145)

## 2023-11-20 LAB — HIV-1/2 AB - DIFFERENTIATION
HIV 1 Ab: REACTIVE — AB
HIV 2 Ab: NONREACTIVE

## 2023-11-20 LAB — HEPATITIS B SURFACE ANTIBODY, QUANTITATIVE: Hep B S AB Quant (Post): <3.5 m[IU]/mL — ABNORMAL LOW

## 2023-11-20 LAB — HIV-1 RNA QUANT-NO REFLEX-BLD
HIV 1 RNA Quant: <20 {copies}/mL
LOG10 HIV-1 RNA: UNDETERMINED {Log_copies}/mL

## 2023-11-21 LAB — BASIC METABOLIC PANEL WITH GFR
Anion gap: 9 (ref 5–15)
BUN: 22 mg/dL — ABNORMAL HIGH (ref 6–20)
CO2: 23 mmol/L (ref 22–32)
Calcium: 9.1 mg/dL (ref 8.9–10.3)
Chloride: 108 mmol/L (ref 98–111)
Creatinine, Ser: 1.2 mg/dL (ref 0.61–1.24)
GFR, Estimated: >60 mL/min (ref >60)
Glucose, Bld: 162 mg/dL — ABNORMAL HIGH (ref 70–99)
Potassium: 4.4 mmol/L (ref 3.5–5.1)
Sodium: 140 mmol/L (ref 135–145)

## 2023-11-21 LAB — CBC WITH DIFFERENTIAL/PLATELET
Abs Granulocyte: 2.3 K/uL (ref 1.5–6.5)
Abs Immature Granulocytes: 0.01 K/uL (ref 0.00–0.07)
Basophils Absolute: 0 K/uL (ref 0.0–0.1)
Basophils Relative: 0 %
Eosinophils Absolute: 0.2 K/uL (ref 0.0–0.5)
Eosinophils Relative: 4 %
HCT: 27.9 % — ABNORMAL LOW (ref 39.0–52.0)
Hemoglobin: 9.2 g/dL — ABNORMAL LOW (ref 13.0–17.0)
Immature Granulocytes: 0 %
Lymphocytes Relative: 37 %
Lymphs Abs: 1.7 K/uL (ref 0.7–4.0)
MCH: 32.5 pg (ref 26.0–34.0)
MCHC: 33 g/dL (ref 30.0–36.0)
MCV: 98.6 fL (ref 80.0–100.0)
Monocytes Absolute: 0.4 K/uL (ref 0.1–1.0)
Monocytes Relative: 8 %
Neutro Abs: 2.3 K/uL (ref 1.7–7.7)
Neutrophils Relative %: 51 %
Platelets: 286 K/uL (ref 150–400)
RBC: 2.83 MIL/uL — ABNORMAL LOW (ref 4.22–5.81)
RDW: 12.4 % (ref 11.5–15.5)
Smear Review: NORMAL
WBC: 4.6 K/uL (ref 4.0–10.5)
nRBC: 0 % (ref 0.0–0.2)

## 2023-11-26 LAB — CBC WITH DIFFERENTIAL/PLATELET
Abs Immature Granulocytes: 0.01 K/uL (ref 0.00–0.07)
Basophils Absolute: 0 K/uL (ref 0.0–0.1)
Basophils Relative: 1 %
Eosinophils Absolute: 0.2 K/uL (ref 0.0–0.5)
Eosinophils Relative: 4 %
HCT: 35.6 % — ABNORMAL LOW (ref 39.0–52.0)
Hemoglobin: 11.6 g/dL — ABNORMAL LOW (ref 13.0–17.0)
Immature Granulocytes: 0 %
Lymphocytes Relative: 35 %
Lymphs Abs: 1.5 K/uL (ref 0.7–4.0)
MCH: 32.3 pg (ref 26.0–34.0)
MCHC: 32.6 g/dL (ref 30.0–36.0)
MCV: 99.2 fL (ref 80.0–100.0)
Monocytes Absolute: 0.4 K/uL (ref 0.1–1.0)
Monocytes Relative: 10 %
Neutro Abs: 2.2 K/uL (ref 1.7–7.7)
Neutrophils Relative %: 50 %
Platelets: 301 K/uL (ref 150–400)
RBC: 3.59 MIL/uL — ABNORMAL LOW (ref 4.22–5.81)
RDW: 12.7 % (ref 11.5–15.5)
WBC: 4.3 K/uL (ref 4.0–10.5)
nRBC: 0 % (ref 0.0–0.2)

## 2023-11-26 LAB — BASIC METABOLIC PANEL WITH GFR
Anion gap: 10 (ref 5–15)
BUN: 43 mg/dL — ABNORMAL HIGH (ref 6–20)
CO2: 22 mmol/L (ref 22–32)
Calcium: 9.8 mg/dL (ref 8.9–10.3)
Chloride: 103 mmol/L (ref 98–111)
Creatinine, Ser: 1.07 mg/dL (ref 0.61–1.24)
GFR, Estimated: >60 mL/min (ref >60)
Glucose, Bld: 152 mg/dL — ABNORMAL HIGH (ref 70–99)
Potassium: 5.2 mmol/L — ABNORMAL HIGH (ref 3.5–5.1)
Sodium: 135 mmol/L (ref 135–145)

## 2023-11-28 NOTE — Discharge Summary (Addendum)
 Physician Discharge Summary    Patient Name: Carl Reed Patient Date of Birth: 06/24/1965  Patient Medical Record Number: [id number] Admission Date: 10/27/2023  Discharge Date: No discharge date for patient encounter. Length of Stay: 32 Attending PHysician: Corean JONETTA Daring, MD Primary Care Physician: Elizbeth Blanch, MD [phone]  Primary Discharge Diagnosis: Toxic Metabolic encephalopathy  Secondary Discharge Diagnosis:  AKI Sepsis from CAP DM I with hyperglycemia Dysphagia    Reason for LTACH Hospitalization:  Encephalopathy form hypoglycemia, tube feeding, PT/OT  Presenting Problem/History of Present Illness from previous hospital ([facility]) Carl Reed is a 58 y.o. male that has been admitted to [facility]  with altered mental status.  Pt has a of Type I DM, HTN, alcohol dependence,  HIV, DKA, positive PPD that was treated, contact dermatitis, tobacco use disorder who was found unresponsive by his wife on 10/15/23. He was last seen by wife watching TV 10 PM the previous evening. Pt started a new insulin  device 1 week prior to admission (ilet BIONIC Pancreas).  Per wife, the insulin  cartridge had gone empty and he had been using insulin  pens since then. On arrival of EMS the FSBS was very low and IV glucose was administered. On arrival to ED patient was obtunded and he was intubated due to poor GCS.  He had head CT/MRI, EEG, and lumbar puncture as per Neurology which were all negative. There was concern for Wernicke's encephalopathy or alcohol related brain injury. He was treated with CIWA protocol and high dose IV Thiamine  500 mg IV every 8 hours. He was treated with Precedex drip which was weaned off on 10/20/23.  The CT of chest/abdomen/pelvis showed bilateral groundglass opacities that were felt to reflect inflammation vs infection. His blood cultures, urine cultures, and CSF cultures were negative. He was treated with IV Rocephin and IV Zithromax, Duonebs, and  incentive spirometry. His blood sugars and blood pressure remained stable. He was treated with Lantus  20 units along with sliding scale insulin . There was concern for dysphagia and a dobhoff tube was placed and he was started on TF-Osmolite 1.5 @50  ML/hour. He was extubated and maintained on RA. He was continued on Tivicay  and Epivir  from home. He reportedly had a rash on the back and legs that was treated with Hydrocortisone cream. His mental status did not improve. He was transferred to [facility] for further nutritional support, PT, OT and neurological monitoring.     Hospital Course by problem:   1) Toxic metabolic encephalopathy Cause not clear and likely multifactorial including hypoglycemia, infection, Wernicke's encephalopathy, or alcohol related brain injury. CT head, MRI brain, EEG, and LP were all negative. Ammonia level was 51. He was intubated due to mental status and treated with Precedex drip which was weaned off on 07/17.  He was evaluated by Neurology. Continue supportive care with high dose Thiamine  500 mg IV every 8 hours thru 10/30/23  followed by Thiamine  100 mg daily, neuro checks, PT/OT and speech therapy.  Patient was started on brain stimulating agent.  Nursing tried to remove his restraints on 11/06/2023 but he started trying to hit the nurses.  He was started on zyprexa where his restraints were taken off on 11/07/2023.  Patient's Zyprexa was increased to b.i.d due to continued agitation.  The patient is more alert but was agitated in the morning of 11/18/2023.  Continue Ativan  1 mg per tube 6 hours as needed.  He was taken out of his restraints on 11/19/2023 and he seems to be doing  well so far.      2) Acute kidney injury:  His renal function had worsened where we increased his free water flushes and started him on IV fluids.  His kidney function is now back to normal.   Avoid nephrotoxic medications.      3) Sepsis due to community acquired pneumonia CT of C/A/P showed  bilateral ground glass opacities. He was treated with IV Zithromax and IV Rocephin. Currently afbrile.  Recent chest x-ray shows improvement of pneumonia.  The patient completed a course of Rocephin on 10/30/2023.   4)Type I DM with hypoglycemia with hemoglobin A1c of 10.7  Per report was profoundly hypoglycemic in the field., Was hypoglycemic episode 11/14/2023. Wife states he had been using a new device (iletBionic Pancreas) for 1 week but the cartridge remained empty on 10/15/23 and he had been using insulin  pens.  Accuchecks every 6 hours and SSI increased to moderate.    5) Dysphagia High risk for aspiration. S/P dobfoff. The registered dietitian and speech therapy we will continue to follow up.  He had a PEG tube placed on 11/05/2023.  He is tolerating bolus feeds and is being discharged on it.    6) Alcohol use disorder Family reports 8 cans of alcohol seltzer and 1 pint of of liquor daily. He was treated with Precedex for alcohol withdrawal and was weaned off this on 10/18/23. Continue Thiamine , multivitamins, Folate along with PRN Ativan /Haldol for agitation.       7)HIV infection CD4 count is 581. Continue home Tivicay  and Epivir    8)Essential HTN: Coreg  increased to 25 mg BID, and Lisinopril  20 mg daily   9)Dermatitis He was treated for rash in back and legs but none seen here.   10) Debility:  He walked 10 ft with therapy last week where he has been more awake and alert to work with his therapies.  This has been intermittent though. He is being discharged to SNF for further rehabilitation.     11) Prevention  The patient is on Lovenox  for DVT prophylaxis and PPI for GI prophylaxis.  Discharge Medications:   Medication List     START taking these medications      Prescription Last Dose and Time given  acetaminophen  325 MG tablet Commonly known as: TYLENOL   Administer 2 tablets (650 mg total) per tube every 6 (six) hours as needed for mild pain. Refill: 0  650 mg on November 27, 2023  8:19 PM   amantadine 100 MG capsule Commonly known as: SYMMETREL  1 capsule (100 mg total) by PO/Per Tube route in the morning and 1 capsule (100 mg total) before bedtime. Refill: 0  100 mg on November 28, 2023  7:37 AM   carvedilol 12.5 MG tablet Commonly known as: COREG  1 tablet (12.5 mg total) by PO/Per Tube route 2 (two) times a day with Breakfast and Dinner. Refill: 0  12.5 mg on November 28, 2023  7:37 AM   diphenoxylate-atropine 2.5-0.025 MG per tablet Commonly known as: LOMOTIL  Take 1 tablet by mouth 4 (four) times a day as needed for diarrhea for up to 9 days. Refill: 0  1 tablet on November 26, 2023 10:05 PM   dolutegravir  sodium 50 MG tablet tablet Commonly known as: TIVICAY   1 tablet (50 mg total) by PO/Per Tube route in the morning. Refill: 0  50 mg on November 28, 2023  9:27 AM   ferrous sulfate 300 (60 Fe) MG/5ML syrup  5 mL (300  mg total) by PO/Per Tube route daily with breakfast. Refill: 0 Start Taking: November 29, 2023  300 mg on November 28, 2023  7:37 AM   fish oil liquid oral liquid  Take 10 mL by mouth in the morning. Refill: 0  10 mL on November 28, 2023  9:28 AM   folic acid  1 MG tablet Commonly known as: FOLVITE   1 tablet (1 mg total) by PO/Per Tube route in the morning. Refill: 0  1 mg on November 28, 2023  9:26 AM   insulin  glargine 100 UNIT/ML injection  Inject 10 Units under the skin in the morning and 10 Units before bedtime. Refill: 0  10 Units on November 28, 2023  9:27 AM   lamiVUDine  300 MG tablet Commonly known as: EPIVIR   Take 1 tablet (300 mg total) by mouth in the morning. Refill: 0  300 mg on November 28, 2023  9:27 AM   lisinopril  20 MG tablet Commonly known as: ZESTRIL   1 tablet (20 mg total) by PO/Per Tube route in the morning. Refill: 0  20 mg on November 28, 2023  9:26 AM   loperamide 2 MG capsule Commonly known as: IMODIUM  1 capsule (2 mg total) by PO/Per Tube route 3 (three) times a day as needed for  diarrhea. Refill: 0    LORazepam  1 MG tablet Commonly known as: ATIVAN   Take 1 tablet (1 mg total) by mouth every 6 (six) hours as needed for anxiety for up to 7 days. Dispense: 28 tablet Refill: 0  1 mg on November 28, 2023  7:37 AM   melatonin tablet  1 tablet (3 mg total) by PO/Per Tube route nightly as needed for sleep. Refill: 0  3 mg on November 27, 2023 10:37 PM   OLANZapine 5 MG tablet Commonly known as: ZYPREXA  1 tablet (5 mg total) by PO/Per Tube route in the morning and 1 tablet (5 mg total) before bedtime. Indications: Delirium. Refill: 0  5 mg on November 28, 2023  9:26 AM   Omeprazole ODT 20 MG disintegrating tablet Commonly known as: Prilosec OTC  2 tablets (40 mg total) by PO/Per Tube route in the morning. Refill: 0  40 mg on November 28, 2023  9:26 AM   sertraline 50 MG tablet Commonly known as: ZOLOFT  1 tablet (50 mg total) by PO/Per Tube route in the morning. Refill: 0  50 mg on November 28, 2023  9:26 AM   Tab-A-Vite/Beta Carotene tablet  Take 1 tablet by mouth in the morning. Refill: 0  1 tablet on November 28, 2023  9:26 AM   thiamine  100 MG tablet  1 tablet (100 mg total) by PO/Per Tube route in the morning. Refill: 0  100 mg on November 28, 2023  9:26 AM        Active Issues Requiring Follow-up: Type 1 DM Encephalopathy from hypoglycemia and alcohol use.   Outpatient Follow-Up: Follow-up Information   None      Code Status at Discharge: Full Resuscitation  Physical Exam at Discharge: Pulse: 80 Resp: 20 BP: 126/71 Temp: 97 F (36.1 C) Weight: 128 lb 9.6 oz (58.3 kg)  Physical Exam   Physical Exam:  Patient seen and examined in no distress. Laying on the bed, withdrawn HEENT:  Normocephalic atraumatic, pupils equal round and reactive, EOMs intact, nares  patent, oropharynx is clear  Neck :  Supple  Cardiovascular : S1-S2 no murmurs rubs or gallops Lungs: clear to auscultation to the base Abdomen:  positive bowel sounds soft peg  intact Extremities: no clubbing cyanosis or edema  Neuro:  awake, alert    NPO Diet, Tube Feeding No Tray Strict NPO; Glucerna 1.5; Tube Feeding Continuous rate (mL/hr): 50; Free water restriction: Other (Comment Required); Tube Feeding water flush (mL): 120; Water Flush type: Water; Water flush frequency: Every 4 hours       SHARMA, SANTOSH R, MD 11/28/2023   Time spent on counseling/coordination of care: 1 Hour 45 Minutes Total time spent with patient: 45 Minutes

## 2023-11-28 NOTE — Unmapped External Note (Signed)
 Case Management Reassessment Note  CM Reassessment Note:   Contact with:  Spouse  Name(s):  Carl Reed  Discharge Plan Status:  Final  Patient Discharge Goal:  SNF/SNU  Back-Up Discharge Goal:  SNF/SNU    Expected Discharge Date: 11/28/2023   Narrative  Spoke to Carl Reed the wife of patient she agrees with the discharge plan. Patient is discharging to [facility]. [facility] Pick-up is at 12:00pm. Nursing report can be called to [phone]. Wife agrees to provide HIV meds to facility. Case Management will continue to monitor until charge.   Carl Reed, LAKETHA, LPN [id number]

## 2023-11-28 NOTE — Unmapped External Note (Signed)
 Team Conference Note  Date: 11/28/2023   Time: 10:43 AM EDT    Patient Name:  Carl Reed      Medical Record Number: [id number]  Date of Birth: December 02, 1965 Sex: Male         Room/Bed:  5E19/5E19-A Admit Date/Time:  10/27/2023  1:13 PM  Patient Active Problem List   Diagnosis    Toxic metabolic encephalopathy       Team Members Present:  Clotilda Ball, WCN  Camie Parkinson. RD Carlin Revel, Pharmacist Tora Ford, Nursing Supervisor  Garnette Louder, RRT Dennie Fuse, CM  Luke Pina, DCM Lynwood Glatter, PT Currie Awe, SLP  Case Manager in Attendance: Dennie Fuse, LPN  Anticipated Discharge 11/28/2023   Discharge Plan: Discharge Destination Details : SNF/SNU Back-up Discharge Destination: SNF/SNU  Barriers to Discharge: Caregiver limitations, Capacity for self care, Potential need for skills/non-skilled services, Need may exceed prior care facility capacity  Clinical Barriers: Clinical needs exceed caregiver capacity  Suicide Risk Assessment: No Concerns identified at this time  Pain Management:: Pain reported as adequately managed at this time.  Team Goals:  Case Management Goal 1: : Patient will work with therapy to increase strength and endurance Goal 1 - Progress: Progressing Goal 1 - Expected End Date: 11/29/23        Nursing (Wound and other) Goal 2: SAFETY Goal 2 - Progress: Progressing Goal 2 - Expected End Date: 12/21/23 Goal 5: cont poc Goal 5 - Progress: Progressing Goal 5 - Expected End Date: 11/23/23   Respiratory Therapy     Pharmacy Current Pharmacy Goals    Antimicrobial goal:  Resolution of Infection   Behavior/delirium goal:  Minimize Medication Contributions to  Behavior/Delirium        Therapy   PT Weekly Progress Note     PT Weekly Progress Note by Arvella A. Bouplon, PTA at 11/28/2023  7:29 AM     Author: Arvella A. Bouplon, PTA Service: -- Author Type: Physical Therapy Assistant   Filed: 11/28/2023  7:33 AM Date  of Service: 11/28/2023   Editor: Arvella A. Bouplon, PTA (Physical Therapy Assistant)            PT Weekly Progress Note  Patient Name: Carl Reed Patient Birthdate: [birth year]  PT CURRENT FUNCTIONAL STATUS:  PT Current Functional Status:    PT Current Functional Status:  Patient is a 58 y/o M who was evaluated for PT on 10/26/23. Patient progressing towards goals set by PT. Patient min to mod assist of 1 with bed mobility and transfers and gait with RW. Patient ambulating 2 to 8 feet with RW with min  to mod assist of 1 depending on arousal of patient. Continue to progress towards goals set by PT.  Factors in Goal Achievement: Barriers:  Cognitive deficits, Decreased safety awareness, Strength limitations and Diminished endurance     DME Recommendations Common Therapy DME:  (to be obtained)  CARE Score Key: 6: Independent. Helper provides no assistance with tasks. A device may or may not have been used. 5: Set-up or clean-up assistance. Helper sets up or cleans up, but does not assist with tasks. Helper may have assisted prior to or following the activity. 4: Supervision or touching assistance. Helper provides verbal cues or touching/steadying or contact guard assistance. Assistance may be provided throughout the activity or intermittently. 3: Partial/moderate assistance. Helper does less than half the effort. Helper lifts, holds, or supports trunk or limbs, but provides less than half the effort. 2: Substantial/maximal assistance. Helper does more than  half the effort. Helper lifts or holds trunk or limbs, and provides more than half the effort. 1: Dependent. Helper does all of the effort, or the assistance of two or more helpers is required for the patient to complete the activity. -: Inconsistent or incomplete documentation  Activity not attempted values: 7: Patient refused 9: Not applicable - Not attempted and the patient did not perform this activity prior to the current illness,  exacerbation, or injury. 10: Not attempted due to environmental limitations (e.g., lack of equipment, weather constraints) 88: Not attempted due to medical condition or safety concerns  Goals: Goal Status on Admission Current Status  Sit to Lying LTG: Supervision or touching assistance Sit to Lying - CARE Score: 3 (10/29/23 1142) Sit to Lying - CARE Score: 3 (11/28/23 0727)  Lying to Sitting on Side of Bed LTG: Supervision or touching assistance Lying to Sitting on Side of Bed - CARE Score: 2 (10/29/23 1142) Lying to Sitting on Side of Bed - CARE Score: 3 (11/28/23 0727)  Sit to Stand LTG: Supervision or touching assistance Sit to Stand - CARE Score: 2 (10/29/23 1142) Sit to Stand - CARE Score: 3 (11/28/23 0727)  Chair/Bed-to-Chair Transfer LTG: Supervision or touching assistance Chair/Bed-to-Chair Transfer - CARE Score: 1 (10/29/23 1142) Chair/Bed-to-Chair Transfer - CARE Score: 3 (11/28/23 0727)    Walk 10 Feet - CARE Score: 88 (10/29/23 1142) Walk 10 Feet - CARE Score: 88 (11/28/23 0727)  Walking 10 Feet on Uneven Surfaces LTG: Supervision or touching assistance Walking 10 Feet on Uneven Surfaces - CARE Score: 10 (10/29/23 1142) Walking 10 Feet on Uneven Surfaces - CARE Score: 88 (11/28/23 0727)  Walk 50 Feet with Two Turns LTG: Supervision or touching assistance Walk 50 Feet with Two Turns - CARE Score: 88 (10/29/23 1142) Walk 50 Feet with Two Turns - CARE Score: 88 (11/28/23 0727)    Walk 150 Feet - CARE Score: 88 (10/29/23 1142) Walk 150 Feet - CARE Score: 88 (11/28/23 0727)  1 Step (Curb) LTG: Partial/moderate assistance 1 Step (Curb) - CARE Score: 88 (10/29/23 1142) 1 Step (Curb) - CARE Score: 88 (11/28/23 0727)    4 Steps - CARE Score: 88 (10/29/23 1142) 4 Steps - CARE Score: 88 (11/28/23 0727)    12 Steps - CARE Score: 88 (10/29/23 1142) 12 Steps - CARE Score: 88 (11/28/23 0727)    Wheel 50 Feet with Two Turns - CARE Score: 88 (10/29/23 1142) Wheel 50 Feet with Two Turns - CARE Score: 88  (11/28/23 0727)    Wheel 150 Feet - CARE Score: 88 (10/29/23 1142) Wheel 150 Feet - CARE Score: 88 (11/28/23 0727)   Other Goals: Goal Status on Admission Current Status  Goal Status - Patient will demonstrate sitting balance: Touching/contact guard Status on Evaluation(Complete Only on Evaluation): min to mod A (10/29/23 1142)                                                            BOUPLON, BRAD A., PTA 11/28/2023             OT Weekly Progress Note     OT Weekly Progress Note by Eleanor Linger, OT at 11/28/2023  8:55 AM     Author: Eleanor Linger, OT Service: Therapy Author Type: Occupational Therapist   Filed: 11/28/2023  8:59 AM Date of Service: 11/28/2023   Editor: Eleanor Linger, OT (Occupational Therapist)            Occupational Therapy     Weekly Progress Note  Patient Name: Carl Reed Patient Birthdate: Oct 25, 1965    OT Current Functional Status:  Pt's engagement with therapy continues to waiver pending pt's level of arousal with verbal and tactile stimuli. Pt is max A-TD for all ADLs at this time.    Facilitating Factors in Goal Achievement:  Patient compliance Barriers to Goal Achievement:  Diminished endurance       CARE Score Key: 6: Independent. Helper provides no assistance with tasks. A device may or may not have been used. 5: Set-up or clean-up assistance. Helper sets up or cleans up, but does not assist with tasks. Helper may have assisted prior to or following the activity. 4: Supervision or touching assistance. Helper provides verbal cues or touching/steadying or contact guard assistance. Assistance may be provided throughout the activity or intermittently. 3: Partial/moderate assistance. Helper does less than half the effort. Helper lifts, holds, or supports trunk or limbs, but provides less than half the effort. 2: Substantial/maximal assistance. Helper does more than half the effort. Helper lifts or holds trunk or limbs, and  provides more than half the effort. 1: Dependent. Helper does all of the effort, or the assistance of two or more helpers is required for the patient to complete the activity. -: Inconsistent or incomplete documentation  Activity not attempted values: 7: Patient refused 9: Not applicable - Not attempted and the patient did not perform this activity prior to the current illness, exacerbation, or injury. 10: Not attempted due to environmental limitations (e.g., lack of equipment, weather constraints) 88: Not attempted due to medical condition or safety concerns  Goals: Goal Status on Admission Current Status  Roll Left and Right LTG: Supervision or touching assistance Roll Left and Right - CARE Score: 1 (10/29/23 1403) Roll Left and Right - CARE Score: 2 (11/28/23 0854)    Eating - CARE Score: 88 (10/29/23 1403) Eating - CARE Score: 88 (11/28/23 0854)  Oral Hygiene LTG: Supervision or touching assistance Oral Hygiene - CARE Score: 1 (10/29/23 1403)      Toilet Transfer - CARE Score: 88 (10/29/23 1403) Toilet Transfer - CARE Score: 88 (11/28/23 0854)    Toileting Hygiene - CARE Score: 1 (10/29/23 1403) Toileting Hygiene - CARE Score: 1 (11/28/23 0854)     Picking Up Object - CARE Score: 88 (10/29/23 1403) Picking Up Object - CARE Score: 88 (11/28/23 0854)     Car Transfer - CARE Score: 10 (10/29/23 1403) Car Transfer - CARE Score: 10 (11/28/23 0854)    Other Goals: Goal Status on Admission Current Status  Goal Status - Patient will demonstrate Grooming : Minimal assistance Status on Evaluation(Complete Only on Evaluation): TD (10/29/23 1403) Current Status: Substantial/maximal assistance (due to decreased arousal) (11/28/23 0854)  Goal Status - Patient will complete Upper Body Dressing: Moderate assistance Status on Evaluation(Complete Only on Evaluation): TD (10/29/23 1403) Current Status: Dependent (11/28/23 0854)  Goal Status - Patient will complete Upper Body Bathing: Minimal assistance  Status on Evaluation(Complete Only on Evaluation): TD (10/29/23 1403) Current Status: Dependent (11/28/23 0854)                           Current Status: Poor (11/28/23 0854)  Goal Status - Patient will demonstrate Safety Awareness: Fair Status on Evaluation(Complete Only on Evaluation): poor (10/29/23 1403) Current Status: Poor (11/28/23 0854)  Goal Status - Patient will not present with further deterioration of  current condition: Will not present with further deterioration of current condition Status on Evaluation(Complete Only on Evaluation): will not present with further deterioration of current condition (10/29/23 1403) Current Status: Will not present with further deterioration of current condition (11/28/23 9145)    Additional Goals &Status: N/A      LILLA SETTER, OT 11/28/2023             SLP Weekly Progress Note     SLP Weekly Progress Note by Currie L. Sturgill, CCC-SLP at 11/28/2023 10:04 AM     Author: Saundra L. Sturgill, CCC-SLP Service: -- Chartered Loss Adjuster Type: Speech and Language Pathologist   Filed: 11/28/2023 10:05 AM Date of Service: 11/28/2023   Editor: Currie L. Sturgill, CCC-SLP Agricultural Engineer)            Speech Language Pathologist         Weekly Progress Note  Patient Name: Carl Reed Patient Birthdate: [birth year]   SLP Current Functional Status:  Patient showing minimal progress with recovery program.           Goals:  Goal Status on Evaluation Current Status           The patient will consume po solids without evidence of post swallow distress or adverse health complications of Goal Status - Level of Assist: IDDSI 6 Soft & Bite-sized (NDD3) NPO    The patient will consume po liquids without evidence of post swallow distress or adverse health complications of Goal Status - Level of Assist: IDDSI 0 Thin Liquids NPO             The patient will utilize compensatory swallowing strategies as directed to  maximize safety and swallow function without post swallow distress or adverse health complications with  Goal Status - Level of Assist: Moderate cues Dependent    Patient will be consistently alert and responsive to stimulation Goal Status - Level of Assist: Minimal assistance Dependent    Patient will sustain attention to tasks Goal Status - Level of Assist: Minimal assistance Dependent                                            Patient will demonstrate functional voicing at Goal Status - Level of Assist: Independent level Dependent             Patient will demonstrate verbal expression to communicate basic wants/needs Goal Status - Level of Assist: Independent level Dependent                                        Patient will demonstrate simple auditory comprehension Goal Status - Level of Assist: Independent level Dependent                                  STURGILL, SAUNDRA L., CCC-SLP 11/28/2023                     Nutrition Nutrition Goals (Active)     Problem: Increased Nutrient Needs  Dates: Start:  10/29/23      Description: Increased need for a specific nutrient compared to established reference standards or recommendations based on physiological needs.  Related to: acute illness As evidenced by: estimated nutrition needs    Goal: Tolerate Enteral Nutrition     Dates: Start:  10/29/23    Expected End:  12/07/23       Outcomes     Date/Time User Outcome   11/21/23 1250 Camie KYM Parkinson, RD Progressing   11/14/23 1239 Camie KYM Parkinson, RD Progressing      Flowsheet     Taken at 11/05/23 0818   Vitamin and Mineral Supplement Therapy: Multi-Vitamin;Vitamin by  Sara C. Swindell, RD Comment: Iron, Thiamine , Folic acid           Medications: Current Medications[1]  Pharmacy: Latest Pharmacy IDT Documentation      Value Time User   Medications Reviewed:  Considerations for Discharge 11/27/2023 11:50 AM  [place]  Dorlene, PharmD   Documented  cosiderations for discharge:  Antimicrobials;  Behavior/Delirium 11/27/2023 11:50 AM [place]  Dorlene, PharmD   Current antimicrobials:  Medications Related to Antimicrobial Stewardship         Dose/Rate Route Frequency Stop    lamiVUDine  (EPIVIR ) tablet 300 mg        300 mg Oral Daily      dolutegravir  sodium (TIVICAY ) 50 MG tablet 50 mg        50 mg PO/Per Tube Daily      11/27/2023 11:50 AM [place]  Dorlene, PharmD   Barriers to participating in therapy:  Delirium/Behavior Documentation   Flowsheet Row Most Recent Value  Medication regimen: Reviewed for at risk delirium  Current medication regimen, recent administrations, and schedule reviewed:   Medication contributors identified - discussed with LIP  Medication contributors identified: No changes made  Contributing Factor Notes: Haldol 5 mg IV q6hprn, Zyprexa, sertraline,  Ativan   Discuss medication risk with Interdisciplinary Team: No changes to orders  or frequency of at-risk med administrations since last discussed with  LIP/Treatment team  Risk Factor Notes: olanzapine, lorazepam   11/27/2023 11:50 AM [place]   Dorlene, PharmD                                                Inpatient Morphine Milligram Equivalents Per Day 8/20 - 8/28     No orders with morphine equivalence       Nursing Team Conference:  Bladder Continent: Incontinent Bladder Devices: External catheter Bladder Interventions: Timing voiding Bladder: Describe level of assistance required and any barriers encountered in management: TOTAL CARE  Bowel Continent: Incontinent Bowel Devices: None Bowel Interventions: None Bowel: Describe level of assistance required and any barriers encountered in management: TOTAL CARE  Pain: Clinician observation only/Patient unable to communicate   Pain - Functional Impact: Self Care/ADL's;Turning Aggravating Factors Impacting Pain: Activity Duration;Positioning Alleviating Factors Impacting Pain:  Repositioning Pain Education Provided: Patient Non-Pharmacologic Pain Interventions: Distractions;Position/Reposition Emotional/Spiritual Pain Interventions: Emotional Support   Pain Pharmacologic Treatments: Medicated - see MAR Opiate Use Anticipated After Discharge: No    Nutrition Intake: Tube feeds Nutrition Level of Assistance: Total assistance Nutrition Interventions: None Nutrition: General appetite of patient over past 7 days, how diet tolerated, how responding to interventions, are boluses or supplements: CONTINUE POC  Safety Status: Poor retention Safety Interventions: Education Safety: Describe response to interventions during past 7 days, include identified triggers  for behavior episodes, and management plans: CONTINUE FALL PRECAUTIONS  Cognitive Orientation: Person Cognitive Deficits Observed: Unable to follow directions Cognitive: Describe interventions used during past 7 days and response: CONTINUE POC  Quality of Sleep: Restful Approximate Hours Slept: 6 Sleep: Describe interventions in use and patient response during past 7 days: CONTINUE POC  Medical Issues: ENCEPHALOPATHY Pain Issues: CONTINUE TO EVALUATE Educational Topics: FALL PRECAUTIONS Patient-Family barriers to learning: Cognition Is patient on dialysis?: No    Mobility: What is the patient's mobility level?: Level 3: EOB + OOB + ROM Please identify the most relevant barrier to mobilization: Compliance/Refusal (patient or family)   Respiratory Team Conference: Patient is not currently receiving RT services                Nutrition Team Conference:    Diet Orders  (From admission, onward)           Start     Ordered   11/28/23 0923  NPO Diet, Tube Feeding No Tray Strict NPO; Glucerna 1.5; Tube Feeding Continuous rate (mL/hr): 50; Free water restriction: Other (Comment Required); Tube Feeding water flush (mL): 120; Water Flush type: Water; Water flush frequency: Every 4 hours  Diet  effective now       End/Expires: Until Specified    Question Answer Comment  NPO Status: Strict NPO   Tube Feeding Formula: Glucerna 1.5   Tube Feeding Continuous rate (mL/hr): 50   Free water restriction: Other (Comment Required)   Tube Feeding water flush (mL): 120   Water Flush type: Water   Water flush frequency: Every 4 hours   Place order in third party system. Done      11/28/23 0923   11/05/23 1300  Fiber supplement Banatrol TF; 1 unit packet; Feeding tube  RD 3 times daily       End/Expires: Until Specified    Question Answer Comment  Select Supplement: Banatrol TF   Volume: 1 unit packet   Administration Route: Feeding tube      11/05/23 0822            Height: 5' 6 (167.6 cm)  Admit Weight: 139 lb (63 kg)  Current Weight: 128 lb 9.6 oz (58.3 kg)  Body mass index is 20.76 kg/m. Calorie Count:    Nutrition Intake: Enteral Nutrition Nutrition Risk: Patient at nutrition risk Nutrition Recommendations: Continue current nutrition care plan Nutrition Barriers to D/C: None Nutrition Update: Anti-diarrheal added for diarrhea  Wound: Wound Rx: None Wound: Current Status: SKIN INTACT       [1]  Current Facility-Administered Medications:    glucose chewable tablet 12 g, 3 tablet, Oral, PRN **OR** 4 ounces of juice or non-diet carbonated beverage for hypoglycemia, 4 fluid ounce, Oral, PRN, Stephanie D Brown, MD   acetaminophen  (TYLENOL ) tablet 650 mg, 650 mg, Per Tube, Q6H PRN, Adeel M Siddiqui, MD, 650 mg at 11/27/23 2019   amantadine (SYMMETREL) capsule 100 mg, 100 mg, PO/Per Tube, Twice daily 800 1400, Stephanie D Brown, MD, 100 mg at 11/28/23 0737   carvedilol (COREG) tablet 12.5 mg, 12.5 mg, PO/Per Tube, 2 times per day, Adeel M Siddiqui, MD, 12.5 mg at 11/28/23 0737   diphenoxylate-atropine (LOMOTIL) 2.5-0.025 MG per tablet 1 tablet, 1 tablet, Oral, 4x Daily PRN, Leona M Cammock, MD, 1 tablet at 11/26/23 2205   dolutegravir  sodium (TIVICAY ) 50 MG  tablet 50 mg, 50 mg, PO/Per Tube, Once a day, Adeel M Siddiqui, MD, 50 mg at 11/28/23 9072   Electrolyte Replacement, ,  Does not apply, Once a day, Adeel M Siddiqui, MD   enoxaparin  (LOVENOX ) syringe 40 mg, 40 mg, Subcutaneous, Once a day, Adeel M Siddiqui, MD, 40 mg at 11/28/23 0925   ferrous sulfate 300 (60 Fe) MG/5ML syrup 300 mg, 300 mg, PO/Per Tube, Daily with breakfast, Corean JONETTA Daring, MD, 300 mg at 11/28/23 0737   fish oil oral liquid 10 mL, 10 mL, Oral, Once a day, Corean JONETTA Daring, MD, 10 mL at 11/28/23 9071   folic acid  (FOLVITE ) tablet 1 mg, 1 mg, PO/Per Tube, Once a day, Adeel M Siddiqui, MD, 1 mg at 11/28/23 9073   glucagon  injection reconstituted solution 1 mg, 1 mg, Intramuscular, PRN, Stephanie D Brown, MD   haloperidol lactate (HALDOL) injection 5 mg, 5 mg, Intramuscular, Q6H PRN, Adeel M Siddiqui, MD, 5 mg at 11/27/23 2019   insulin  glargine injection 10 Units, 10 Units, Subcutaneous, 2 times per day, Corean JONETTA Daring, MD, 10 Units at 11/28/23 9072   lamiVUDine  (EPIVIR ) tablet 300 mg, 300 mg, Oral, Once a day, Adeel M Siddiqui, MD, 300 mg at 11/28/23 9072   lisinopril  (ZESTRIL ) tablet 20 mg, 20 mg, PO/Per Tube, Once a day, Adeel M Siddiqui, MD, 20 mg at 11/28/23 9073   loperamide (IMODIUM) capsule 2 mg, 2 mg, PO/Per Tube, TID PRN, Derryl JONELLE Pottier, MD   LORazepam  (ATIVAN ) tablet 1 mg, 1 mg, Oral, Q6H PRN, Jason J Van Eyk, MD, 1 mg at 11/28/23 9262   magnesium  oxide tablet 400 mg, 400 mg, PO/Per Tube, Q6H PRN **OR** magnesium  sulfate infusion 2 g 50 mL, 2 g, Intravenous, Q2H PRN, Adeel M Siddiqui, MD   melatonin tablet 3 mg, 3 mg, PO/Per Tube, Nightly PRN, Stephanie D Brown, MD, 3 mg at 11/27/23 2237   OLANZapine (ZYPREXA) tablet 5 mg, 5 mg, PO/Per Tube, 2 times per day, Corean JONETTA Daring, MD, 5 mg at 11/28/23 9073   Omeprazole ODT (Prilosec OTC) disintegrating tablet 40 mg, 40 mg, PO/Per Tube, Once a day, Adeel M Siddiqui, MD, 40 mg at 11/28/23 9073    potassium chloride  (KLOR-CON ) CR tablet 40 mEq, 40 mEq, PO/Per Tube, Q4H PRN **OR** potassium chloride  20 mEq in 100 mL IVPB premix, 20 mEq, Intravenous, Q2H PRN, Adeel M Siddiqui, MD   Reason for No Pharmacological VTE Prophylaxis, , Does not apply, UD, Adeel CHRISTELLA Hatchet, MD   sertraline (ZOLOFT) tablet 50 mg, 50 mg, PO/Per Tube, Once a day, Corean JONETTA Daring, MD, 50 mg at 11/28/23 9073   Tab-A-Vite/Beta Carotene tablet 1 tablet, 1 tablet, Oral, Once a day, Adeel M Siddiqui, MD, 1 tablet at 11/28/23 9073   thiamine  tablet 100 mg, 100 mg, PO/Per Tube, Once a day, Adeel M Siddiqui, MD, 100 mg at 11/28/23 [phone]

## 2023-11-29 NOTE — Progress Notes (Signed)
 Population Health TCM Discharge to SNF/Rehab  Received notification pt was discharged from [facility]  to SNF/Rehab [facility] andn rehab  on  11/28/23  No action indicated by ACN at this time.  Follow as indicated pending discharge notification.

## 2023-11-29 NOTE — Unmapped External Note (Signed)
 Physical Therapy         Discharge Summary  Patient Name: Carl Reed Patient Birthdate: Feb 20, 1966  Patient Status:    Patient Status:  Discharge to Home/IRF/SNF  PT CURRENT FUNCTIONAL STATUS:  PT Current Functional Status:    PT Current Functional Status:  Pt is 58 yo M who was admitted on 10/15/23 after being found unresponsive by wife in the am. Last seen around 10 pm watching TV. Upon arrival to ED, pt was obtunded with very low glucose reading. Pt was intubated and head CT negative.  Pt was dx metabolic encephalopathy, head CT and MRI brain no acute intracranial abnormality. Ammonia level 51. Pt was weaned off precedex on morning of 7/17. Pt was transferred to [facility] on 10/27/23 for further care. Pt was evaluated by PT on 10/29/23. Pt is S for rolling, c/g to min A for supine to edge of bed. Pt is able to transfer with min to mod A and ambulating up to 10 feet with rolling walker and min to mod A. Participation fluctuating with level of arousal. P: Pt was discharged to SNF on 11/28/23 for further care.   Factors in Goal Achievement: Facilitating Factors:  Improved functional mobility and Improved cognition Barriers:  Balance deficits, Diminished endurance, Strength limitations and Cognitive deficits   Patient needs assistance with the following activities:  Balance, Sitting balance and Walking and/or mobility   RLE:  Full weight-bearing LLE:  Full weight-bearing Weight Bearing Status:  Full - No restrictions throughout   DME Recommendations Common Therapy DME:  (to be obtained)  CARE Scores Key:  6: Independent. Helper provides no assistance with tasks. A device may or may not have been used. 5: Set-up or clean-up assistance. Helper sets up or cleans up, but does not assist with tasks. Helper may have assisted prior to or following the activity. 4: Supervision or touching assistance. Helper provides verbal cues or touching/steadying or contact  guard assistance. Assistance may be provided throughout the activity or intermittently. 3: Partial/moderate assistance. Helper does less than half the effort. Helper lifts, holds, or supports trunk or limbs, but provides less than half the effort. 2: Substantial/maximal assistance. Helper does more than half the effort. Helper lifts or holds trunk or limbs, and provides more than half the effort. 1: Dependent. Helper does all of the effort, or the assistance of two or more helpers is required for the patient to complete the activity. -: Inconsistent or incomplete documentation  Activity not attempted values: 7: Patient refused 9: Not applicable - Not attempted and the patient did not perform this activity prior to the current illness, exacerbation, or injury. 10: Not attempted due to environmental limitations (e.g., lack of equipment, weather constraints) 88: Not attempted due to medical condition or safety concerns   Goals: Goal Goal Status Discharge Status  Sit to Lying LTG: Supervision or touching assistance Partially Achieved improving arousal and mobility  Sit to Lying - CARE Score: 3 (11/28/23 0727)  Lying to Sitting on Side of Bed LTG: Supervision or touching assistance Partially Achieved improving arousal and mobility  Lying to Sitting on Side of Bed - CARE Score: 3 (11/28/23 0727)  Sit to Stand LTG: Supervision or touching assistance Partially Achieved improving arousal and mobility  Sit to Stand - CARE Score: 3 (11/28/23 0727)  Chair/Bed-to-Chair Transfer LTG: Supervision or touching assistance Partially Achieved improving arousal and mobility  Chair/Bed-to-Chair Transfer - CARE Score: 3 (11/28/23 0727)    Walk 10 Feet - CARE Score: 3 (11/28/23 0727)  Walking 10 Feet on Uneven Surfaces LTG: Supervision or touching assistance Partially Achieved improving arousal and mobility  Walking 10 Feet on Uneven Surfaces - CARE Score: 88 (11/28/23 0727)  Walk 50 Feet with Two Turns LTG:  Supervision or touching assistance Partially Achieved improving arousal and mobility  Walk 50 Feet with Two Turns - CARE Score: 88 (11/28/23 0727)    Walk 150 Feet - CARE Score: 88 (11/28/23 0727)  1 Step (Curb) LTG: Partial/moderate assistance Partially Achieved improving arousal and mobility  1 Step (Curb) - CARE Score: 88 (11/28/23 0727)    4 Steps - CARE Score: 88 (11/28/23 0727)    12 Steps - CARE Score: 88 (11/28/23 0727)    Wheel 50 Feet with Two Turns - CARE Score: 88 (11/28/23 0727)    Wheel 150 Feet - CARE Score: 88 (11/28/23 0727)   Other Goals: Goal Goal Status Discharge Status  Goal Status - Patient will demonstrate sitting balance: Touching/contact guard Achieved Current Status: Touching/contact guard (11/21/23 9350)                               Additional Goals: N/A      Discharge Instructions given to patient:  PT Discharge Instructions   Recommend PT at discharge, pt is currently mod A for transfers and short distance ambulation with rolling walker        MERILEE LYNWOOD MATSU., PT 11/29/2023

## 2023-11-30 NOTE — Unmapped External Note (Signed)
 Speech Language Pathologist        Discharge Summary  Patient Name: Carl Reed Patient Birthdate: 09-10-1965   Patient Status:    Patient Status:  Discharge to Home/IRF/SNF          Patient discharged to skilled nursing facility.  Patient's cognition improved somewhat, as he would initially have moments when he followed one step commands.  Most of the time, however, he remained confused and not able to attend to task.       Goals:  Goal Goal Status Discharge Status          The patient will consume po solids without evidence of post swallow distress or adverse health complications of Goal Status - Level of Assist: IDDSI 6 Soft & Bite-sized (NDD3) Not Achieved Discharged on PEG tube feeding. NPO  The patient will consume po liquids without evidence of post swallow distress or adverse health complications of Goal Status - Level of Assist: IDDSI 0 Thin Liquids Not Achieved Discharged on PEG tube feeding.  NPO            Goal Status - Level of Assist: Moderate cues   (Dependent)  Patient will be consistently alert and responsive to stimulation Goal Status - Level of Assist: Minimal assistance Partially Achieved   Dependent  Patient will sustain attention to tasks Goal Status - Level of Assist: Minimal assistance Partially Achieved   Dependent                                    Patient will demonstrate functional voicing at Goal Status - Level of Assist: Independent level Partially Achieved   Dependent level          Patient will demonstrate verbal expression to communicate basic wants/needs Goal Status - Level of Assist: Independent level Not Achieved   Dependent level                                  Patient will demonstrate simple auditory comprehension Goal Status - Level of Assist: Independent level Partially Achieved   Dependent level                             Discharge Instructions given to patient:     ELIAS CURRIE CROME.,  CCC-SLP 11/30/2023

## 2023-12-01 NOTE — ED Triage Notes (Signed)
 Pt BIB [facility] from [facility] with concerns for AMS. Pt is a type 1 diabetic. Sugar read high, facility gave him 10 units of insulin , rechecked and still read high. Pt had recent admission for metabolic encephalopathy and pneumonia. GCS 14.

## 2023-12-01 NOTE — H&P (Signed)
 ------------------------------------------------------------------------------- Attestation signed by Dorn Ozell Murray, MD at 12/01/2023  6:47 AM I have seen and examined the patient, and I have reviewed the resident's note and agree with the pertinent history and physical exam findings except where specifically noted below.  I have discussed the assessment and plan with the resident with my rationale and recommendations expressed within the note or as documented below. I have personally reviewed pertinent labs and imaging performed on the patient during this visit.  Please see my plan of care for full attestation.   -------------------------------------------------------------------------------  General Medicine Daniel II History and Physical  Subjective: Chief Complaint: AMS History obtained from: chart review History of Present Illness: Carl Reed is a 58 y.o. male with PMHx of T1DM c/b DKA, HTN, alcohol dependence, HIV, positive PPD s/p treatment presenting from SNF [facility] [address]) with AMS.   Patient was recently discharged from outside hospital after month long  admission on 11/28/23 for metabolic encephalopathy in setting of hypoglycemia. During this stay, patient was treated for sepsis 2/2 PNA and evaluated for toxic metabolic encephalopathy requiring intubation, thought to be multifactorial iso hypoglycemia, infection, possible Wernicke's. Evaluated by Neurology and at this time CT head, MRI brain, EEG, and LP were all negative.   After discharge, patient was doing okay until afternoon 8/29 where he was found to be altered at his facility. They checked his BG at the facility and was high, given 10U of insulin  w/o imp, thereafter brought into ED.   On interview, patient unable to give additional history.   ED Course (reviewed and summarized):  On arrival, pt HDS. Initial workup significant for BG 943, Hyper K 6.8,  AKI with Cr 2.36. UA concerning for UTI. Started on  Rocephin, given 1L bolus.   Vitals:T 98.5 P 103 RR 17 BP 118/94, 100 % on RA Labs: BG > 600 CMP - Na 132, K 6.8, BG 943. Cr 2.36 (up from 1.0 baseline) CBC -  Hb 10.7 (stable), MCV 101.4,  RPP - neg UA - 5000 leuk esterase, 1+ blood, trace ketones, > 1000 glucose   EKG - sinus tachycardia   Radiology: CXR - no consolidations Interventions: Given 10u regular insulin ,1 L IVF,  Started on Endotool   Medical History[1] Social History   Socioeconomic History   Marital status: Married    Spouse name: Not on file   Number of children: Not on file   Years of education: Not on file   Highest education level: Not on file  Occupational History   Not on file  Tobacco Use   Smoking status: Every Day    Current packs/day: 1.00    Average packs/day: 1 pack/day for 30.8 years (30.8 ttl pk-yrs)    Types: Cigarettes    Start date: 04/24/1988    Last attempt to quit: 05/28/2018    Passive exposure: Current   Smokeless tobacco: Never   Tobacco comments:    pack a day  Substance and Sexual Activity   Alcohol use: Yes    Comment: 12 pack per week   Drug use: No   Sexual activity: Not on file  Other Topics Concern   Not on file  Social History Narrative   Not on file   Social Drivers of Health   Food Insecurity: No Food Insecurity (10/29/2023)   Received from [facility]   Food vital sign    Within the past 12 months, you worried that your food would run out before you got money to buy more: Never  true    Within the past 12 months, the food you bought just didn't last and you didn't have money to get more: Never true  Transportation Needs: No Transportation Needs (11/28/2023)   Received from [facility]   SM SDOH Transportation Source    Has lack of transportation kept you from medical appointments or from getting medications?: No    Has lack of transportation kept you from meetings, work, or from getting things needed for daily living?: No  Safety: Patient  Unable To Answer (10/27/2023)   Received from [facility]   Domestic Abuse Assessment    Do you feel safe in your relationships at home?: Unable to assess    Physical Abuse: Unable to assess    HRSN Domestic Abuse - Type of Abuse: Not on file    HRSN Domestic Abuse - Time Frame: Not on file    HRSN Domestic Abuse - Signs and Symptoms: Not on file    Verbal Abuse: Unable to assess    HRSN Domestic Abuse - Reported To: Not on file  Recent Concern: Safety - High Risk (10/10/2023)   Safety    How often does anyone, including family and friends, physically hurt you?: Never    How often does anyone, including family and friends, insult or talk down to you?: Never    How often does anyone, including family and friends, threaten you with harm?: Never    How often does anyone, including family and friends, scream or curse at you?: Sometimes  Living Situation: Low Risk  (10/29/2023)   Received from [facility]   Living Situation    In the last 12 months, was there a time when you were not able to pay the mortgage or rent on time?: No    In the past 12 months, how many times have you moved where you were living?: 0    At any time in the past 12 months, were you homeless or living in a shelter (including now)?: No   Family History[2]      OBJECTIVE: Physical Exam: Vital Signs: Vitals:   12/01/23 0320  BP: 158/88  Pulse: 104  Resp: 14  Temp:   SpO2: 99%    CONSTITUTIONAL: malnourished and NAD, unable to assess orientation CARDIOVASCULAR: normal rate and regular rhythm PULMONARY/CHEST WALL: effort normal ABDOMINAL: soft, ND. G-tube in place  NEURO: eyes awakening to voice/sternal rub, unable to follow commands but attempts to answer some questions. Spontaneous movement of extremities EXTREMITIES: no LE edema   CBC:  Results from last 7 days  Lab Units 12/01/23 0114  WHITE BLOOD CELL COUNT 10*3/uL 9.60  HEMOGLOBIN g/dL 89.2*  HEMATOCRIT % 66.7*  PLATELET COUNT  10*3/uL 314   CMP:  Results from last 7 days  Lab Units 12/01/23 0430 12/01/23 0114  SODIUM mmol/L 141 132*  POTASSIUM mmol/L 4.6 6.8*  CHLORIDE mmol/L 105 98  CO2 mmol/L 25 23  BUN mg/dL 80* 86*  CREATININE mg/dL 7.84* 7.63*  CALCIUM  mg/dL 9.8 9.8  MAGNESIUM  mg/dL  --  2.4  BILIRUBIN TOTAL mg/dL  --  0.5  AST U/L  --  16  ALT U/L  --  26  TOTAL PROTEIN g/dL  --  7.1  ALBUMIN g/dL  --  3.7  ANION GAP mmol/L 11 11    ASSESSMENT/PLAN: Carl Reed is a 58 y.o. male with T1DM c/b DKA, HTN, alcohol dependence, HIV, positive PPD s/p treatment presenting from SNF with AMS, found to be hyperglycemic .   #  Hyperglycemia iso T1DM #AKI #hyperkalemia Patient found to be altered at facility with elevated BG (undetectable) BG up to 943 on admit, however no AG or ketosis, thus not favoring ketoacidosis VBG with pH 7.30 Cr 2.3, up from baseline ~1.0  K elevated to 6.8 - now s/p 10 u insulin , 1L IVF in ED  -- PLAN: - continue Endotool  - NPO  - IVF (NS + 20 mEQ K @100  cc/hr) - Tele - BMP Q4hrs - BG q1 hrs    #AMS #UTI Acute onset of AMS following recent hospitalization c/b metabolic encephalopathy, required intubation and zyprexa d/t agitation  Most likely metabolic encephalopathy in setting of possible infection, with UA with possible UTI as preciptating factor vs severe hyperglycemia  -- PLAN:  - continue Rocephin  - FU cultures/sensitivities - cont home Zyprexa, amantadine  - consider imaging studies if no imp   #Hx Alcohol Use #Macrocytic anemia Unclear if current use, will monitor for signs/symptoms of withdrawal  Hb 10.7 on admit -- PLAN: - CIWA protocol  - Wernicke dose thiamine , home folic acid   - daily CBC - cont home iron supp  Chronic Medical Problems: HIV - cont Dovato   HTN - cont home coreg, HOLD lisinopril   Dysphagia - has PEG tube in place, dietician consult once hyperglycemia resolves Mood - cont SSRI   Hospital Checklist #DVT PPX:  Heparin  SubQ  #FEN: No diet orders on file #Discharge Planning: pending clinical improvement  #Ethics: Full Code    Electronically signed by Joesph Rosina Saba, MD 12/01/2023 4:31 AM       [1] Past Medical History: Diagnosis Date   Alcoholism    (CMD) 06/18/2015   Contact dermatitis and other eczema, due to unspecified cause    Dermatophytosis of foot    Diabetes mellitus type II, controlled    (CMD)    DKA (diabetic ketoacidosis)    (CMD) 03/27/2020   Human immunodeficiency virus (HIV) disease    (CMD) 1996   CD4 nadir 298 (10/00)   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

## 2023-12-05 NOTE — Unmapped External Note (Signed)
 Occupational Therapy      Discharge Summary   Patient Name: Carl Reed Patient Birthdate: 02/02/1966   Patient Status:    Patient Status:  Discharge to Home/IRF/SNF   OT Current Functional Status:  Pt was evaluated by OT 7/28 due to deficits with: ADLs, cognition, functional transfers, and activity tolerance. Pt has made slow progress with therapy due to wavering levels of arousal. Pt would benefit from continued OT services upon discharge to SNF.    Facilitating Factors in Goal Achievement:  Support of family or caregiver and Support of significant other Barriers to Goal Achievement:  Diminished endurance, Cognitive deficits and Decreased patient understanding   Patient needs assistance with the following activities:  Activities of daily living and Use of bathroom equipment   RLE:  Full weight-bearing LLE:  Full weight-bearing RUE:  Full weight-bearing LUE:  Full weight-bearing Weight Bearing Status:  Full - No restrictions throughout          CARE Scores Key: 6: Independent. Helper provides no assistance with tasks. A device may or may not have been used. 5: Set-up or clean-up assistance. Helper sets up or cleans up, but does not assist with tasks. Helper may have assisted prior to or following the activity. 4: Supervision or touching assistance. Helper provides verbal cues or touching/steadying or contact guard assistance. Assistance may be provided throughout the activity or intermittently. 3: Partial/moderate assistance. Helper does less than half the effort. Helper lifts, holds, or supports trunk or limbs, but provides less than half the effort. 2: Substantial/maximal assistance. Helper does more than half the effort. Helper lifts or holds trunk or limbs, and provides more than half the effort. 1: Dependent. Helper does all of the effort, or the assistance of two or more helpers is required for the patient to complete the activity. -: Inconsistent or incomplete  documentation  Activity not attempted values: 7: Patient refused 9: Not applicable - Not attempted and the patient did not perform this activity prior to the current illness, exacerbation, or injury. 10: Not attempted due to environmental limitations (e.g., lack of equipment, weather constraints) 88: Not attempted due to medical condition or safety concerns  Goals: Goal Goal Status Discharge Status  Roll Left and Right LTG: Supervision or touching assistance Not Achieved max A Roll Left and Right - CARE Score: 2 (11/28/23 0854)    N/A or No Goal Listed Eating - CARE Score: 88 (11/28/23 0854)  Oral Hygiene LTG: Supervision or touching assistance Not Achieved TD Oral Hygiene - CARE Score: 1 (10/29/23 1403)    N/A or No Goal Listed Toilet Transfer - CARE Score: 88 (11/28/23 0854)    N/A or No Goal Listed Toileting Hygiene - CARE Score: 1 (11/28/23 0854)  Wash Upper Body LTG: Partial/moderate assistance Not Achieved TD Wash Upper Body - CARE Score: 1 (11/28/23 0854)    N/A or No Goal Listed Picking Up Object - CARE Score: 88 (11/28/23 0854)    N/A or No Goal Listed Car Transfer - CARE Score: 10 (11/28/23 0854)   Other Goals: Goal Goal Status Discharge Status  Goal Status - Patient will demonstrate Grooming : Minimal assistance Not Achieved max A Current Status: Substantial/maximal assistance (due to decreased arousal) (11/28/23 0854)  Goal Status - Patient will complete Upper Body Dressing: Moderate assistance Not Achieved TD Current Status: Dependent (11/28/23 0854)    N/A or No Goal Listed    Goal Status - Patient will complete Upper Body Bathing: Minimal assistance Not Achieved TD Current Status:  Dependent (11/28/23 0854)    N/A or No Goal Listed      N/A or No Goal Listed      N/A or No Goal Listed Current Status: Poor (11/28/23 0854)    N/A or No Goal Listed      N/A or No Goal Listed      N/A or No Goal Listed    Goal Status - Patient will demonstrate Safety Awareness: Fair Not  Achieved poor Current Status: Poor (11/28/23 0854)  Goal Status - Patient will not present with further deterioration of  current condition: Will not present with further deterioration of current condition Achieved Current Status: Will not present with further deterioration of current condition (11/28/23 9145)    Additional Goals: N/A      Discharge Instructions given to patient:  OT Discharge Instructions   Recommend OT services upon discharge       LILLA SETTER, OT 12/05/2023

## 2023-12-08 NOTE — Care Plan (Signed)
  Problem: PAIN - ADULT Goal: Verbalizes/displays adequate comfort level or baseline comfort level Description: INTERVENTIONS: 1. Encourage pt to monitor pain and request assistance 2. Assess pain using appropriate pain scale 3. Administer analgesics based on type and severity of pain and evaluate response 4. Implement non-pharmacological measures as appropriate and evaluate response 5. Consider cultural and social influences on pain and pain management 6. Notify LIP if interventions unsuccessful or patient reports new pain Outcome: Progressing   Problem: Safety - Adult Goal: Free from fall injury Description: INTERVENTIONS: 1. Assess pt frequently for physical needs 2. Identify cognitive and physical deficits and behaviors that affect risk of falls. 3. Institute fall precautions as indicated by assessment. 4. Educate pt/family on patient safety including physical limitations 5. Instruct pt to call for assistance with activity based on assessment 6. Modify environment to reduce risk of injury 7. Consider OT/PT consult to assist with strengthening/mobility Outcome: Progressing

## 2023-12-16 NOTE — Discharge Summary (Signed)
 ------------------------------------------------------------------------------- Attestation signed by Lauraine Harrison Lent, MD at 01/06/2024  6:20 PM Patient seen and examined on day of discharge, agree with discharge documentation -------------------------------------------------------------------------------  General Medicine [facility] Discharge Summary  Name: Carl Reed MRN: [id number] Age: 58 yrs DOB: Jun 16, 1965  Admit date: 12/01/2023 Discharge date and time: 01/05/2024  Admitting Physician: Rea Deanie Rickers, MD Discharge Physician: Lauraine Lent, MD  Admission Diagnoses:  Hyperosmolar hyperglycemic state (HHS)    (CMD) [E11.00]   Discharge Diagnoses:  Poorly controlled type 1 diabetes AMS most consistent with delirium  Admission Condition: poor Discharged Condition: fair  Hospital Course:  For full details, please see H&P, progress notes, consult notes and ancillary notes. Briefly, Carl Reed is a 58 y.o. male with PMHx of T1DM c/b DKA, HTN, alcohol dependence, HIV, positive PPD s/p treatment presenting from SNF [facility]) with AMS. Found to be hyperglycemic and hypernatremic on admission. Hospital course will be addressed in a problem based format as below.  #Toxic metabolic encephalopathy #Hypernatremia #Delirium Patient had acute onset of encephalopathy at SNF after recent hospitalization with encephalopathy requiring intubation and Zyprexa due to agitation.  It is suspected that he most likely had hypoglycemic brain injury during that hospitalization.  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  He additionally had multiple psychoactive medications on board, discharged with amantadine and Zyprexa.  He was noted to be hyperglycemic on arrival here as well as hypernatremic which was suspected to be contributing to his encephalopathy this admission.  This hypernatremia was corrected with increasing his free water flushes.  The  amantadine and Zyprexa were discontinued as it was unclear what the indication was to start them during last hospitalization.  His mental status improved as his sodium returned to normal.  However, he then had episodes of delirium which required him to be in restraints and have a sitter.  He was started on nightly Seroquel and dose was titrated to 25 mg daily and 75 mg of Seroquel nightly.  He was continued on Warnicke dose thiamine  and folic acid  supplementation during this admission.  With moderate improvement in patient's glucose control and supportive care, patient's mentation did improve.  He is more interactive and conversant than upon admission.  Upon discharge he is alert and oriented by 2 (name and place, not year).  #Hyperglycemia #Episodic hypoglycemia #T1DM #G tube dependent Patient was hyperglycemic on admission at 943, however, had no anion gap or ketosis to suggest DKA.  Also did not appear consistent with HHS.  He was initially put on an insulin  drip and was able to rapidly be switched to subcutaneous insulin .  Endocrinology was consulted given his type 1 diabetes mellitus and difficult to control his blood sugars in the setting of recent initiation of tube feeds.  He had intermittent episodes of hypoglycemia throughout this admission, most often occurring at night.  Patient was able to tolerate p.o. intake.  Tube feeds were discontinued.  Patient will discharge with a basal bolus regimen.  Patient will take Lantus  11 units nightly.  Patient will take prandial lispro 6 units 3 times daily with meals.  Patient will also have correctional sliding scale insulin .  Endocrine assisted with this regimen.  They noted that with the patient's significant brittle diabetes, 200-400 to be a reasonable goal for glucose levels.  #Possible UTI Patient was noted to have a urinalysis on admission that was somewhat consistent with infection and urine culture with less than 10,000 colony-forming units of an  unknown organism.  Given we  are unable to assess his symptoms, he was empirically treated with a 5-day course of Rocephin.  He did not report of any other significant dysuria symptoms throughout the rest of his hospitalization.  On day of discharge, patient is clinically stable with no new examination findings or acute symptoms compared to prior.  The patient was seen by the attending physician on the date of discharge and deemed stable and acceptable for discharge.  The patient's chronic medical conditions were treated accordingly per the patient's home medication regimen.  The patient's medication reconciliation, follow-up appointments, discharge orders, instructions, and significant lab and diagnostic studies are as noted.   Medication changes: Insulin  regimen as follows: Insulin  Lantus : 11 units nightly Insulin  lispro: 6 units 3 times daily after meals Insulin  lispro: 0 to 6 units BG 70 - 179: No dose needed  BG 180 - 200: 0  BG 201 - 250: 2  BG 251 - 300: 3 BG 301 - 350: 4   BG 351 - 400: 6  BG Over 400, let provider know The following medications have been discontinued: Amantadine, Ativan , Lomotil, scheduled Tylenol , olanzapine, tramadol, tadalafil Carvedilol was restarted upon discharge  Discharge Follow-up Action Items: Patient given a supply of his HIV medications to bedside for 1 month.  Patient must follow-up with infectious disease.  (Urgent referral placed) Patient is to follow-up with endocrinology on 10/20 Follow-up with PCP in 1 to 2 weeks If patient improves from a mentation perspective, can wean down Seroquel Follow up with Neuro outpatient (order placed for ambulatory referral) Please note that endocrinology recommends a more lenient blood sugar goal of 200-400 without hypoglycemia or hyperglycemic emergency.   Patient's Ordered Code Status: Full Code  Physical Exam Constitutional: NAD, resting in bed, well-nourished, conversational HEENT: EOMI, PERRL, MMM Neck:  Supple and full range of motion Cardiovascular: regular rhythm, normal S1 and S2, no m/r/g, peripheral pulses palpable Respiratory: Lungs clear to auscultation bilaterally. Normal work of breathing on room air  Abdominal: Soft, nondistended, nontender to palpation  Skin: No obvious rashes or lesions. Warm and dry.  MSK: Normal strength and range of motion in all extremities.  No peripheral edema.  Neuro: Alert and oriented x3. Following commands. No focal deficits.  Psych: Appropriate mood and affect   Consults: IP CONSULT TO NUTRITION SERVICES IP CONSULT TO ENDOCRINOLOGY IP CONSULT TO NEUROLOGY IP CONSULT TO PALLIATIVE CARE  Disposition: Skilled nursing facility  Patient Instructions:    Medication List     START taking these medications    * insulin  lispro 100 unit/mL injection Commonly known as: HumaLOG  Inject 6 Units under the skin 3 (three) times a day after meals. Replaces: insulin  lispro 100 unit/mL KwikPen   * insulin  lispro 100 unit/mL injection Commonly known as: HumaLOG  Inject 0-6 Units under the skin 4 (four) times a day after meals and at bedtime. 0-6 Units, subcutaneous, 4 times daily after meals and at bedtime. BG Less Than 70: Follow hypoglycemia instructions. BG 70 - 179: No dose needed. BG 180 - 200: 0 units. BG 201 - 250: 2 units. BG 251 - 300: 3 units. BG 301 - 350: 4 units. BG 351 - 400: 6 units. BG Greater Than 400: Call Provider. Instructions: If previous dose of lispro given within 4 hours, please do not give. Please ensure doses of lispro are given at least 4 hours apart. Page on call provider if there is confusion. Use within 28 days after opening   magnesium  oxide 400 mg (241 mg magnesium ) Tab  Take 1 tablet (400 mg total) by mouth daily.   nicotine  14 mg/24 hr patch Commonly known as: NICODERM CQ  Place 1 patch on the skin daily.   polysaccharide iron complex 150 mg iron capsule Commonly known as: NU-IRON Take 1 capsule (150 mg total) by mouth  daily.   * QUEtiapine 25 mg tablet Commonly known as: SEROquel Take 1 tablet (25 mg total) by mouth daily.   * QUEtiapine 25 mg tablet Commonly known as: SEROquel Take 3 tablets (75 mg total) by mouth at bedtime.      * * This list has 4 medication(s) that are the same as other medications prescribed for you. Read the directions carefully, and ask your doctor or other care provider to review them with you.          CHANGE how you take these medications    gabapentin  300 mg capsule Commonly known as: NEURONTIN  Take 1 capsule (300 mg total) by mouth 3 (three) times a day. What changed:  how much to take when to take this   insulin  glargine 100 unit/mL injection Commonly known as: LANTUS  Inject 11 Units under the skin at bedtime. What changed:  how much to take when to take this Another medication with the same name was removed. Continue taking this medication, and follow the directions you see here.   * melatonin 3 mg tablet 3 mg by G-tube route nightly as needed for sleep. What changed: Another medication with the same name was added. Make sure you understand how and when to take each.   * melatonin 3 mg tablet Take 1 tablet (3 mg total) by mouth Every evening. What changed: You were already taking a medication with the same name, and this prescription was added. Make sure you understand how and when to take each.   multivitamin with minerals 1 tablet by G-tube route daily. What changed: how to take this      * * This list has 2 medication(s) that are the same as other medications prescribed for you. Read the directions carefully, and ask your doctor or other care provider to review them with you.          CONTINUE taking these medications    carvediloL 12.5 mg tablet Commonly known as: COREG Take 1 tablet (12.5 mg total) by mouth in the morning and 1 tablet (12.5 mg total) in the evening. Take with meals.   Dovato  50-300 mg Tab per tablet Generic drug:  dolutegravir -lamivudine  Take 1 tablet by mouth daily.   loperamide 2 mg capsule Commonly known as: IMODIUM Take 2 mg by mouth every 8 (eight) hours as needed for diarrhea.   sertraline 50 mg tablet Commonly known as: ZOLOFT 50 mg by G-tube route every morning.   TAB A VITE ORAL 1 tablet by G-tube route every morning.   thiamine  100 mg tablet Commonly known as: VITAMIN B1 100 mg by G-tube route every morning.       STOP taking these medications    acetaminophen  325 mg tablet Commonly known as: TYLENOL    amantadine 100 mg capsule Commonly known as: SYMMETREL   cyclobenzaprine 10 mg tablet Commonly known as: FLEXERIL   diphenoxylate-atropine 2.5-0.025 mg per tablet Commonly known as: LOMOTIL   dolutegravir  50 mg Tab tablet Commonly known as: TIVICAY    ferrous sulfate 325 mg (65 mg iron) tablet   folic acid  1 mg tablet Commonly known as: FOLVITE    FreeStyle Libre 3 Plus Sensor Generic drug: blood-glucose sensor  hydrocortisone 2.5 % cream   insulin  lispro 100 unit/mL KwikPen Commonly known as: HumaLOG  KwikPen Replaced by: insulin  lispro 100 unit/mL injection   ketoconazole 2 % cream Commonly known as: NIZORAL   lamiVUDine  300 mg tablet Commonly known as: EPIVIR    lancets 33 gauge Misc   lansoprazole 30 mg disintegrating tablet Commonly known as: PREVACID SOLUTAB   lisinopriL  20 mg tablet Commonly known as: PRINIVIL    LORazepam  1 mg tablet Commonly known as: ATIVAN    meloxicam 15 mg tablet Commonly known as: MOBIC   NON FORMULARY   NON FORMULARY   NON FORMULARY   OLANZapine 5 mg tablet Commonly known as: ZyPREXA   omeprazole 40 mg DR capsule Commonly known as: PriLOSEC   OneTouch Verio test strips test strip Generic drug: glucose blood   pen needle, diabetic 32 gauge x 5/32 Ndle   tadalafiL 20 mg tablet Commonly known as: CIALIS   traMADoL 50 mg tablet Commonly known as: ULTRAM   triamcinolone acetonide 0.025 % Lotn          Where to Get Your Medications     These medications were sent to [facility] [address] [place] [id number]    Hours: Open Monday 12am to Friday 11:59pm; Sat-Sun: Closed; Holidays: Closed Thanksgiving Phone: [phone]  Dovato  50-300 mg Tab per tablet    Information about where to get these medications is not yet available   Ask your nurse or doctor about these medications gabapentin  300 mg capsule insulin  glargine 100 unit/mL injection insulin  lispro 100 unit/mL injection insulin  lispro 100 unit/mL injection magnesium  oxide 400 mg (241 mg magnesium ) Tab melatonin 3 mg tablet multivitamin with minerals nicotine  14 mg/24 hr patch polysaccharide iron complex 150 mg iron capsule QUEtiapine 25 mg tablet QUEtiapine 25 mg tablet        Follow-up  Future Appointments  Date Time Provider Department Center  01/21/2024  1:40 PM Elizbeth Blanch, MD [facility] END [facility] None    *Some images could not be shown.

## 2023-12-24 NOTE — Progress Notes (Signed)
 ------------------------------------------------------------------------------- Attestation signed by Elsie Karolynn Rover, MD at 12/24/2023  6:30 PM I saw and evaluated the patient. I reviewed the trainees note and agree. I performed the service or was physically present during the critical or key portions of the service furnished by the trainee; and I managed the patient.  Patient mentation calmer today. On PO seroquel 25 mg in AM + 75 mg in PM. Restrated PO oxycodone 2.5 mg scheduled due to possible pain as driver of patient's symptoms on 09/21. Plan to remove restraints today. Still has in-person sitter.  Sugars better, but not yet at goal. GMT is following.  Plan for Palliative care consult to assist with GOC discussions.  Elsie Karolynn Rover, MD 12/24/2023  -------------------------------------------------------------------------------  General Medicine [facility] Progress Note  Admit Date:12/01/2023 12:58 AM; LOS:23   ASSESSMENT AND PLAN  Hospital Course: Jedi Arias Weinert is a 58 y.o. male with PMHx of T1DM c/b DKA, HTN, alcohol dependence, HIV, positive PPD s/p treatment presenting from SNF [facility]) with AMS. Found to be hyperglycemic and hypernatremic on admission. Mental status waxing and waning throughout early hospitalization. He had episodes of agitation requiring soft restraints and a lap belt. These were discontinued on 9/5. PO intake was encouraged with minimal success requiring continuation of tube feeds.    #AMS most consistent with delirium  - Acute onset of AMS following recent hospitalization with metabolic encephalopathy, required intubation and zyprexa d/t agitation - Initially suspected to be due to metabolic cause, but it continued after resolution of those abnormalities, worse at night with fluctuating attentiveness makes new most likely etiology delirium  - CT head with no acute abnormalities - Stop antipsychotic meds started at last hospitalization  (amantadine, zyprexa) - Agitation improved and restraints removed on 9/5 - 09/15 Transitioned from in person to video sitter - EEG with mild to moderate encephalopathy, possible result of medication effects, metabolic abnormalities or neuronal dysfunction.  - 09/18 was started on in person sitter - 09/19 Patient continued to experience staring spells leading to neuro consult.   PLAN - Reorient, delirium protocols.  - Continue Seroquel nightly decreased to 75mg  at night, 25mg  during the day - Neuro consult, appreciate recs.  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    #Hyperglycemia iso T1DM and Tube feeds #AKI (resolved) #hyperkalemia (resolved) - Home insulin  regimen: lantus  30u daily, lispro correctional (unsure of adherence) - Patient found to be altered at facility with elevated BG (undetectable) BG up to 943 on admit, however no AG or ketosis, thus not favoring ketoacidosis - VBG with pH 7.30 - Cr 2.3, up from baseline ~1.0  - K elevated to 6.8 - now s/p 10 u insulin , 1L IVF in ED  - Came off endotool on 8/31 but became hyperglycemic rapidly upon switching to subcutaneous insulin  - AKI and hyperkalemia resolved 9/1 - BHB negative   PLAN - GMT following for glycemic management  -Lantus  8 units at noon starting tomorrow, lispro 2 units 3 times daily after meals -Correctional scale 0 to 6 units 4 times daily with the last dose at 8 PM.  - F/u C-peptide - BG q4 hrs  - Hold home  ACEi   # Hypernatremia (resolved) - Pt hypernatremic to 147 (corrected 152) on 8/31, Pt appears asymptomatic - Free water deficit of 1.6L - Pt  recently taken off IVF and not getting significant FWFs with feeds - NS inadvertently continued for all of 8/31, stopped on 9/1 - Corrected hypernatremia on 9/1: 151 - Resolved 9/3 -- PLAN - Monitor BMPs - Free water flushes 30 ml q4 hours   #Concern for UTI (resolved) - UA with some possible infectious signs, unable to assess symptoms with AMS - Urine Culture with <10k CFUs - Received Rocephin 5 day course - Pt without symtpoms on 9/3 -- PLAN:  - Monitor for symptoms   #Hx Alcohol Use #Macrocytic anemia - Hb 10.7 on admit - Agitated overnight of 8/31 requiring diazepam  addition and lap belt restraint - Unlikely to be going through alcohol withdrawals due to recent hospitalization and facility stay immediately prior to this hospitalization, unable to truly assess last drink -- PLAN - Wernicke dose thiamine , home folic acid   - daily CBC - cont home iron supplement   #Hypoglycemia resolved -09/18 Had a BG of 17 needing dextrose    Plan - Continue to monitor  - GMT following  Chronic Medical Problems: HIV - cont Dovato   HTN - cont home coreg, HOLD lisinopril   Dysphagia - has PEG tube in place, dietician consult once hyperglycemia resolves Mood - cont SSRI   Discharge Planning: - OT recommending skilled rehab  FENGI: Adult Diet- Dysphagia; Thin liquids (no thickener added); Pureed foods (Level 4); Med (45-60 gm/meal), receiving continuous tube feeds DVT ppx: heparin  sq Disposition: TBD Code status: Full   SUBJECTIVE  Interval History:  Mon 12/24/2023 Overnight: NAOE  Patient was resting comfortably in bed during examination with restraints on. He was calm overnight per bedside sitter.  Responsive to name but not to further questioning. Patient will be tried off restraints today.  ROS:  +  - Fever, chills, headache, SOB, cough, chest pain, palpitations, N/V, abdominal pain, constipation, diarrhea, dysuria, LE edema  OBJECTIVE  Physical Exam: BP (!) 103/58  (BP Location: Left arm, Patient Position: Lying)   Pulse 90   Temp 97.8 F (36.6 C) (Oral)   Resp 17   Ht 1.803 m (5' 11)   Wt 53.4 kg (117 lb 11.2 oz)   SpO2 96%   BMI 16.42 kg/m   LINES/DRAINS: PIVs   General: Alert, resting comfortably in no acute distress  Cardiovascular: Regular rate and rhythm. no LE edema.  Chest/Lungs: No increased work of breathing, on RA  Abdomen: Positive bowel sounds. Abdomen soft, nondistended, nontender. Peg tube in place  Neuro: Awake, alert, spontaneously moves all extremities  Psych: Calm, no agitation, requiring in person sitter     Labs, Imaging and Results Labs, radiology images, medication and microbiology have been reviewed and can be viewed in the EMR.  Pertinent findings discussed below or in the assessment and plan.  Labs and imaging: Recent Labs    12/22/23 0355 12/23/23 0449 12/24/23 0524  WBC 3.90* 4.80 8.00  HGB 9.1* 10.0* 9.8*  HCT 27.5* 29.0* 29.8*  MCV 99.1* 98.8* 98.3*  PLT 243 245 267    Recent Labs    12/23/23 0449 12/23/23 1307 12/24/23 0524  NA 132* 134* 136  K 5.4* 4.7 4.6  CL 96* 97* 98  CO2 26 32* 30  BUN 33* 37* 36*  CREATININE 1.09 1.24 1.02  GLUCOSE 355* 314* 327*  CALCIUM  9.5 10.1 9.7    Recent Labs  12/22/23 0355 12/23/23 0449 12/24/23 0524  MG 1.9 1.7* 1.7*    No results for input(s): AST, ALT, BILITOT, PROT, ALBUMIN, ALP in the last 72 hours.   No results found for this visit on 12/01/23 (from the past week).   Electronically signed by: Venson Slater Brooking, MD 12/24/2023 1:33 PM

## 2023-12-25 NOTE — Consults (Signed)
 Palliative Care Consultation Service: Initial Consult Note  Date of service: 12/25/2023 Name:  Carl Reed MRN:  [id number] Length of stay:  24 PCP:  Kreg Lamarr Marin, PA-C (Inactive) Pt Class: Inpatient  Attending: Leora Erskin Merl* Consult requested by: Hospital Medicine  Location at time of consult: Medical    Reasons given by referring provider for initial Palliative Care consult (check all that apply): Inpatient consult to Palliative Care Consult performed by: Tomika Olivia Pouch, AGNP Consult ordered by: Elsie Karolynn Rover, MD    Goals of care   Primary diagnosis leading to Palliative Care consult (check one):  Patient has HIV infection    (CMD); Tobacco use disorder; Dermatitis; DM2 (diabetes mellitus, type 2) (HCC); Hypertension associated with type 1 diabetes mellitus    (CMD); Diabetic polyneuropathy associated with type 1 diabetes mellitus (HCC); Alcoholism (HCC); Osteopenia of lumbar spine; Left hip pain; 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 Other AMS  Medical decision maker:  Name: Jameir Ake, spouse I have confirmed the designated decision maker with this patient: No  Assessment and Recommendations: Carl Reed is a 58 y.o. male Black or African American whose primary language is English with the following problems:  Goals of Care: Were goals of  care documented this visit? Yes: Advance Care Planning: Discussions were had for 35 minutes with the following people wife (via phone). In addition to the below documented Shared Decision -Making Elements, the following were accomplished: Goals of care were clarified Health care surrogate: spouse  Decision making capacity: no  Advanced directives:none uploaded on chart  Unacceptable/Acceptable treatment burdens: -cpr is acceptable at this time -mechanical ventilation is acceptable at this time  Psychosocial/spiritual discussion: Pt is married.  They have a total of 5 children. He used to work as a education administrator for [employer].  He was independent prior to his hospitalization in July. Since July his cognition has been changed.    Outpatient follow-up:unknown  Additional discussion held regarding overall condition. Wife reports that she was only informed that perhaps his cognitive changes were due to how long he was down when she found him in July. She has not received any concrete info about the cause of his AMS.    Advance Directives, MOST form, and Code Status No other interventions done today  Biophysical and Psychological Assessment and Management: Psychological Symptoms:  Agitation Intermittently. Pt has had to have restraints this hospitalization. Currently no agitation noted during visit.  RECOMMENDATIONS: GOC: Per discussion with wife, plans are for discharge to SNF first for rehab and then for pt to return home with her. Will notify CM and primary team.  Plan for Follow-up: Palliative Care will continue to follow this patient. These recommendations have been discussed with the primary team and supporting staff caring for this patient.    History of Present Illness: Carl Reed is a 58 y.o. male with PMHx of T1DM c/b DKA, HTN, alcohol dependence, HIV, positive PPD s/p treatment presenting from SNF [facility]) with AMS.   Current  Status:  Pt seen for  palliative care consult. Sitter at bedside. Pt is pleasantly confused.  Denies pain.  Oriented to self and DOB but not place.  Pain Assessment: Pain Assessment Pain Assessment: Cognitively Impaired/Dementia Pain Score  : 0 Wong-Baker FACES Pain Rating: 0 Clinical Progression: Not changed  PAINAD (Pain Assessment in Advanced Dementia) Breathing: 0 Negative Vocalization: 0 Facial Expression: 0 Body Language: 0 Consolability: 0 PAINAD Score: 0    Analgesic history: Deferred  Opioid Risk Tool:    Score 0 - 3 = Low Risk  Score 4 - 7 = Moderate Risk  Score 8+ = High Risk       [place] Symptom Assessment System: Revised (ESAS-R)  Pain Score  : 0                                       Palliative Performance Scale (PPS) at time of consult:   50%  Screening and Interventions: Medical History[1] Surgical History[2] Family History[3] Social History[4]   Current Medications:  Scheduled Meds: acetaminophen , 650 mg, oral, Q8H [Held by provider] carvediloL, 12.5 mg, oral, BID with meals dolutegravir , 50 mg, oral, Daily folic acid , 1 mg, oral, Daily free water, 30 mL, G-tube, Q4H SCH gabapentin , 300 mg, oral, TID heparin  (porcine), 5,000 Units, subcutaneous, Q8H SCH insulin  glargine, 5 Units, subcutaneous, 2 times per day insulin  aspart (NovoLOG )/insulin  lispro (HumaLOG ) injection (WF), 0-6 Units, subcutaneous, 4 times per day insulin  aspart (NovoLOG )/insulin  lispro (HumaLOG ) injection, 2 Units, subcutaneous, TID PC lamiVUDine , 300 mg, oral, Daily multivitamin with minerals, 1 tablet, G-tube, Daily nicotine , 1 patch, transdermal, Daily omeprazole, 40 mg, oral, Daily 0600 oxyCODONE, 2.5 mg, G-tube, Q8H polysaccharide iron complex, 150 mg of iron, oral, Daily QUEtiapine, 25 mg, G-tube, Daily QUEtiapine, 75 mg, oral, At Bedtime sertraline, 50 mg, oral, Daily thiamine , 100 mg, oral, Daily   Continuous Infusions: Glucerna 1.5, , Last Rate: Stopped (12/25/23  0700)   PRN Meds:   dextrose    dextrose    haloperidol lactate   loperamide   melatonin   ondansetron    polyethylene glycol   sennosides-docusate sodium  I independently reviewed this patient's medications.  Review of Systems:  Review of Systems  Unable to perform ROS: Mental status change    Vitals Temp:  [97.8 F (36.6 C)-98.4 F (36.9 C)] 98.1 F (36.7 C) Heart Rate:  [88-105] 105 Resp:  [16-18] 16 BP: (112-138)/(56-78) 134/70  Intake/Output Summary (Last 24 hours) at 12/25/2023 0833 Last data filed at 12/25/2023 0731 Gross per 24 hour  Intake 4015 ml  Output 2150 ml  Net 1865 ml   Unmeasured Stool Occurrence: 1 (12/25/23 0707)    Physical Exam: Physical Exam Vitals reviewed.  Constitutional:      Appearance: He is ill-appearing.  HENT:     Head: Normocephalic and atraumatic.  Eyes:     Extraocular Movements: Extraocular movements intact.  Pulmonary:     Effort: Pulmonary effort is normal.  Musculoskeletal:        General: Normal range of motion.     Cervical back: Normal range of motion.  Skin:    General: Skin is warm and dry.  Neurological:     Mental Status: He is alert.     Comments: Oriented to self  Psychiatric:        Cognition and Memory: Cognition is impaired. Memory is impaired.      Data  LABS  Lab Results  Component Value Date   WBC 6.40 12/25/2023   RBC 2.88 (L) 12/25/2023   HGB 9.6 (L) 12/25/2023   HCT 28.4 (L) 12/25/2023   MCH 33.1 12/25/2023   PLT 254 12/25/2023   Lab Results  Component Value Date   NA 132 (L) 12/25/2023   K 4.7 12/25/2023   CL 97 (L) 12/25/2023   CO2 28 12/25/2023   BUN 31 (H) 12/25/2023   CREATININE 0.99 12/25/2023   CALCIUM  9.4 12/25/2023   No results found for: LIVER Lab Results  Component Value Date   TSH 1.884 06/06/2022   Lab Results  Component Value Date   ALBUMIN 3.0 (L) 12/05/2023   No results found for: INR Lab Results  Component  Value Date   BNP 83 11/24/2021   Lab Results  Component Value Date   COLORU Yellow 12/01/2023   GLUCOSEU >1000 (A) 12/01/2023   BILIRUBINUR Negative 12/01/2023   BLOODU 1+ (A) 12/01/2023   UROBILINOGEN Normal 12/01/2023   NITRITE Negative 12/01/2023      XRAYS XR Chest 1 View  Final Result by Glendia Darleene Pepper, MD (08/30 0848)  XR CHEST 1 VIEW 12/01/2023 3:05 AM    INDICATION: pneumonia   COMPARISON: CT chest abdomen pelvis 10/23/2023    FINDINGS:    .  Supportive devices: No visualized internal supportive devices.  .  Cardiovascular/Mediastinum: Normal size and contours of the   cardiomediastinal silhouette.  .  Lungs/Pleura: No focal consolidation. No pleural effusion. No   pneumothorax.  .  Other: Visualized abdomen is unremarkable. No acute osseous   abnormality.    IMPRESSION:  No focal consolidation.       Full Code    Billing and Medical Decision Making  Medical Decision Making contribution : During today's visit, I personally addressed the following that applies toward Medical Decision Making:Complexity: The following are medium risk issues for medical decision making in this patient: One or more chronic illnesses with mild exacerbation, progression, or side effects of treatment  Reviewed and/or ordered the following:EHR notes EHR labs    Electronically signed by: Deborrah Olivia Pouch, [place] 12/25/2023 8:33 AM        [1] Past Medical History: Diagnosis Date   Alcoholism    (CMD) 06/18/2015   Contact dermatitis and other eczema, due to unspecified cause    Dermatophytosis of foot    Diabetes mellitus type II, controlled    (CMD)    DKA (diabetic ketoacidosis)    (CMD) 03/27/2020   Human immunodeficiency virus (HIV) disease    (CMD) 1996   CD4 nadir 298 (10/00)   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 [3] Family History Problem Relation Name Age of Onset   Hypertension Other family    Cancer Other family        aunt with lung cancer   Diabetes Other family    Kidney disease Mother    [4] Social History Socioeconomic History   Marital status: Married  Tobacco Use   Smoking status: Every Day    Current packs/day: 1.00    Average packs/day: 1 pack/day for 30.8  years (30.8 ttl pk-yrs)    Types: Cigarettes    Start date: 04/24/1988    Last attempt to quit: 05/28/2018    Passive exposure: Current   Smokeless tobacco: Never   Tobacco comments:    pack a day  Substance and Sexual Activity   Alcohol use: Yes    Comment: 12 pack per week   Drug use: No   Social Drivers of Health   Food Insecurity: Low Risk  (12/20/2023)   Food vital sign    Within the past 12 months, you worried that your food would run out before you got money to buy more: Never true    Within the past 12 months, the food you bought just didn't last and you didn't have money to get more: Never true  Transportation Needs: No Transportation Needs (12/20/2023)   Transportation    In the past 12 months, has lack of reliable transportation kept you from medical appointments, meetings, work or from getting things needed for daily living? : No  Safety: Low Risk  (12/20/2023)   Safety    How often does anyone, including family and friends, physically hurt you?: Never    How often does anyone, including family and friends, insult or talk down to you?: Never    How often does anyone, including family and friends, threaten you with harm?: Never    How often does anyone, including family and friends, scream or curse at you?: Never  Recent Concern: Safety - High Risk (10/10/2023)   Safety     How often does anyone, including family and friends, physically hurt you?: Never    How often does anyone, including family and friends, insult or talk down to you?: Never    How often does anyone, including family and friends, threaten you with harm?: Never    How often does anyone, including family and friends, scream or curse at you?: Sometimes  Living Situation: Low Risk  (12/20/2023)   Living Situation    What is your living situation today?: I have a steady place to live    Think about the place you live. Do you have problems with any of the following? Choose all that apply:: None/None on this list

## 2023-12-25 NOTE — Care Plan (Signed)
 Problem: HBA1C Uncontrolled Goal: Establish regular follow-ups with PCP Outcome: Progressing Goal: Reduce HBA1C levels below 9% Outcome: Progressing   Problem: Fluid Volume: Goal: Will remain free of signs and symptoms of dehydration by discharge Description: Will remain free of signs and symptoms of dehydration by discharge Outcome: Progressing   Problem: PAIN - ADULT Goal: Verbalizes/displays adequate comfort level or baseline comfort level Description: INTERVENTIONS: 1. Encourage pt to monitor pain and request assistance 2. Assess pain using appropriate pain scale 3. Administer analgesics based on type and severity of pain and evaluate response 4. Implement non-pharmacological measures as appropriate and evaluate response 5. Consider cultural and social influences on pain and pain management 6. Notify LIP if interventions unsuccessful or patient reports new pain Outcome: Progressing   Problem: INFECTION - ADULT Goal: Absence of infection during hospitalization Description: INTERVENTIONS: 1. Assess and monitor for signs and symptoms of infection 2. Monitor lab/diagnostic results 3. Monitor all insertion sites i.e., indwelling lines, tubes and drains 4. Monitor endotracheal (as able) and nasal secretions for changes in amount and color 5. Institute appropriate cooling/warming therapies per order 6. Administer medications as ordered 7. Instruct and encourage patient and family to use good hand hygiene technique 8. Identify and instruct in appropriate isolation precautions for identified infection/condition Outcome: Progressing Goal: Absence of fever/infection during anticipated neutropenic period Description: INTERVENTIONS 1. Monitor WBC 2. Administer growth factors as ordered 3. Implement neutropenic guidelines as ordered Outcome: Progressing   Problem: Safety - Adult Goal: Free from fall injury Description: INTERVENTIONS: 1. Assess pt frequently for physical needs 2.  Identify cognitive and physical deficits and behaviors that affect risk of falls. 3. Institute fall precautions as indicated by assessment. 4. Educate pt/family on patient safety including physical limitations 5. Instruct pt to call for assistance with activity based on assessment 6. Modify environment to reduce risk of injury 7. Consider OT/PT consult to assist with strengthening/mobility Outcome: Progressing Goal: Absence of infection during hospitalization Description: INTERVENTIONS: 1. Assess and monitor for signs and symptoms of infection 2. Monitor lab/diagnostic results 3. Monitor all insertion sites i.e., indwelling lines, tubes and drains 4. Monitor endotracheal (as able) and nasal secretions for changes in amount and color 5. Institute appropriate cooling/warming therapies per order 6. Administer medications as ordered 7. Instruct and encourage patient and family to use good hand hygiene technique 8. Identify and instruct in appropriate isolation precautions for identified infection/condition Outcome: Progressing   Problem: DISCHARGE PLANNING Goal: Discharge to home or other facility with appropriate resources Description: INTERVENTIONS: 1. Identify barriers to discharge w/pt and caregiver 2. Arrange for needed discharge resources and transportation as appropriate 3. Identify discharge learning needs (meds, wound care, etc) 4. Arrange for interpreters to assist at discharge as needed 5. Refer to Case Management Department for coordinating discharge planning if the patient needs post-hospital services based on physician order or complex needs related to functional status, cognitive ability or social support system Outcome: Progressing   Problem: Chronic Conditions and Co-Morbidities Goal: Patient's chronic conditions and co-morbidity symptoms are monitored and maintained or improved Description: INTERVENTIONS: 1. Monitor and assess patient's chronic conditions and comorbid  symptoms for stability, deterioration, or improvement 2. Collaborate with multidisciplinary team to address chronic and comorbid conditions and prevent exacerbation or deterioration 3. Update acute care plan with appropriate goals if chronic or comorbid symptoms are exacerbated and prevent overall improvement and discharge Outcome: Progressing   Problem: Bowel/Gastric: Goal: Establishment of normal bowel function will improve by discharge Description: Establishment of normal bowel function will  improve by discharge Outcome: Progressing Goal: Ability to identify factors that contribute to diarrhea and avoid them if possible will improve by discharge Description: Ability to identify factors that contribute to diarrhea and avoid them if possible will improve by discharge Outcome: Progressing Goal: Ability to achieve a regular elimination pattern will return to normal for the patient Description: Ability to achieve a regular elimination pattern will return to normal for the patient Outcome: Progressing   Problem: Nutritional: Goal: Maintenance of adequate nutrition will improve Outcome: Progressing   Problem: Activity: Goal: Ability to ambulate will improve Description: Ability to ambulate will improve Outcome: Progressing Goal: Ability to safely and independently change position in bed will improve Description: Ability to safely and independently change position in bed will improve Outcome: Progressing Goal: Range of joint motion will improve Description: Range of joint motion will improve Outcome: Progressing   Problem: Skin Integrity: Goal: Skin integrity will improve Description: Skin integrity will improve Outcome: Progressing

## 2023-12-25 NOTE — Care Plan (Signed)
°  Problem: Chronic Conditions and Co-Morbidities Goal: Patient's chronic conditions and co-morbidity symptoms are monitored and maintained or improved Description: INTERVENTIONS: 1. Monitor and assess patient's chronic conditions and comorbid symptoms for stability, deterioration, or improvement 2. Collaborate with multidisciplinary team to address chronic and comorbid conditions and prevent exacerbation or deterioration 3. Update acute care plan with appropriate goals if chronic or comorbid symptoms are exacerbated and prevent overall improvement and discharge Outcome: Progressing   Problem: PAIN - ADULT Goal: Verbalizes/displays adequate comfort level or baseline comfort level Description: INTERVENTIONS: 1. Encourage pt to monitor pain and request assistance 2. Assess pain using appropriate pain scale 3. Administer analgesics based on type and severity of pain and evaluate response 4. Implement non-pharmacological measures as appropriate and evaluate response 5. Consider cultural and social influences on pain and pain management 6. Notify LIP if interventions unsuccessful or patient reports new pain Outcome: Progressing   Problem: Nutritional: Goal: Maintenance of adequate nutrition will improve Outcome: Progressing   Problem: Fluid Volume: Goal: Will remain free of signs and symptoms of dehydration by discharge Description: Will remain free of signs and symptoms of dehydration by discharge Outcome: Progressing   Problem: Safety - Adult Goal: Free from fall injury Description: INTERVENTIONS: 1. Assess pt frequently for physical needs 2. Identify cognitive and physical deficits and behaviors that affect risk of falls. 3. Institute fall precautions as indicated by assessment. 4. Educate pt/family on patient safety including physical limitations 5. Instruct pt to call for assistance with activity based on assessment 6. Modify environment to reduce risk of injury 7. Consider OT/PT  consult to assist with strengthening/mobility Outcome: Progressing

## 2023-12-25 NOTE — Progress Notes (Signed)
 ------------------------------------------------------------------------------- Attestation signed by Leora Erskin Tara Le, MD at 12/25/2023  4:53 PM I have personally completed the following: reviewed the lab and culture data, rounded with the primary team, seen and examined the patient, reviewed medications and reviewed patient data with the bedside RN.  I have discussed the preparation of this note with the House staff I agree with the findings and plans as documented.  Edits or additions to this note are documented. Unsure etiology for ongoing encephalopathy.  Concern for post hypoglycemic brain dysfunction.  Repeating MRI. -------------------------------------------------------------------------------  General Medicine [facility] Progress Note  Admit Date:12/01/2023 12:58 AM; LOS:24   ASSESSMENT AND PLAN  Hospital Course: Carl Reed is a 58 y.o. male with PMHx of T1DM c/b DKA, HTN, alcohol dependence, HIV, positive PPD s/p treatment presenting from SNF [facility]) with AMS. Found to be hyperglycemic and hypernatremic on admission. Mental status waxing and waning throughout early hospitalization. He had episodes of agitation requiring soft restraints and a lap belt. These were discontinued on 9/5. PO intake was encouraged with minimal success requiring continuation of tube feeds.    #AMS most consistent with delirium  - Acute onset of AMS following recent hospitalization with metabolic encephalopathy, required intubation and zyprexa d/t agitation - Initially suspected to be due to metabolic cause, but it continued after resolution of those abnormalities, worse at night with fluctuating attentiveness makes new most likely etiology delirium  - CT head with no acute abnormalities - Stop antipsychotic meds started at last hospitalization (amantadine, zyprexa) - Agitation improved and restraints removed on 9/5 - 09/15 Transitioned from in person to video  sitter - EEG with mild to moderate encephalopathy, possible result of medication effects, metabolic abnormalities or neuronal dysfunction.  - 09/18 was started on in person sitter - 09/19 Patient continued to experience staring spells leading to neuro consult.   PLAN - Reorient, delirium protocols.  - Continue Seroquel nightly decreased to 75mg  at night, 25mg  during the day - Neuro consult, appreciate recs.  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 - f/u MRI w/wo contrast.    #Hyperglycemia iso T1DM and Tube feeds #AKI (resolved) #hyperkalemia (resolved) - Home insulin  regimen: lantus  30u daily, lispro correctional (unsure of adherence) - Patient found to be altered at facility with elevated BG (undetectable) BG up to 943 on admit, however no AG or ketosis, thus not favoring ketoacidosis - VBG with pH 7.30 - Cr 2.3, up from baseline ~1.0  - K elevated to 6.8 - now s/p 10 u insulin , 1L IVF in ED  - Came off endotool on 8/31 but became hyperglycemic rapidly upon switching to subcutaneous insulin  - AKI and hyperkalemia resolved 9/1 - BHB negative   PLAN - GMT following for glycemic management  - NPH BID with correction to try to better match the tube feed pause and his PO intake. - F/u C-peptide - Glucerna tube feeds 60ml/hr, to be paused 4 hours everyday. 2 hours before and after dolutegravir . - BG q4 hrs  - Hold home ACEi   # Hypernatremia (resolved) - Pt hypernatremic to 147 (corrected 152) on 8/31, Pt appears asymptomatic - Free water deficit of 1.6L -  Pt recently taken off IVF and not getting significant FWFs with  feeds - NS inadvertently continued for all of 8/31, stopped on 9/1 - Corrected hypernatremia on 9/1: 151 - Resolved 9/3 -- PLAN - Monitor BMPs - Free water flushes 30 ml q4 hours   #Concern for UTI (resolved) - UA with some possible infectious signs, unable to assess symptoms with AMS - Urine Culture with <10k CFUs - Received Rocephin 5 day course - Pt without symtpoms on 9/3 -- PLAN:  - Monitor for symptoms   #Hx Alcohol Use #Macrocytic anemia - Hb 10.7 on admit - Agitated overnight of 8/31 requiring diazepam  addition and lap belt restraint - Unlikely to be going through alcohol withdrawals due to recent hospitalization and facility stay immediately prior to this hospitalization, unable to truly assess last drink -- PLAN - Wernicke dose thiamine , home folic acid   - daily CBC - cont home iron supplement   #Hypoglycemia resolved -09/18 Had a BG of 17 needing dextrose    Plan - Continue to monitor  - GMT following  Chronic Medical Problems: HIV - cont Dovato   HTN - cont home coreg, HOLD lisinopril   Dysphagia - has PEG tube in place, dietician consult once hyperglycemia resolves Mood - cont SSRI   Discharge Planning: - OT recommending skilled rehab  FENGI: Adult Diet- Dysphagia; Thin liquids (no thickener added); Pureed foods (Level 4); Med (45-60 gm/meal), receiving continuous tube feeds DVT ppx: heparin  sq Disposition: TBD Code status: Full   SUBJECTIVE  Interval History:  Tue 12/25/2023 Overnight: NAOE  Patient was resting comfortably in bed during examination. He was calm overnight per bedside sitter. She was found to be hyperglycemic this morning. Responsive to name but not to further questioning. GOC conversation was had with wife via palliative. Wife continues to desire full life sustaining measures at this time   ROS:  +  - Fever, chills, headache, SOB, cough, chest pain, palpitations, N/V, abdominal pain, constipation, diarrhea, dysuria, LE  edema  OBJECTIVE  Physical Exam: BP 134/70 (BP Location: Right arm, Patient Position: Lying)   Pulse 105   Temp 98.1 F (36.7 C) (Axillary)   Resp 16   Ht 1.803 m (5' 11)   Wt 56.6 kg (124 lb 12.5 oz)   SpO2 100%   BMI 17.40 kg/m   LINES/DRAINS: PIVs   General: Alert, resting comfortably in no acute distress  Cardiovascular: Regular rate and rhythm. no LE edema.  Chest/Lungs: No increased work of breathing, on RA  Abdomen: Positive bowel sounds. Abdomen soft, nondistended, nontender. Peg tube in place  Neuro: Awake, alert, spontaneously moves all extremities  Psych: Calm, no agitation, requiring in person sitter     Labs, Imaging and Results Labs, radiology images, medication and microbiology have been reviewed and can be viewed in the EMR.  Pertinent findings discussed below or in the assessment and plan.  Labs and imaging: Recent Labs    12/23/23 0449 12/24/23 0524 12/25/23 0400  WBC 4.80 8.00 6.40  HGB 10.0* 9.8* 9.6*  HCT 29.0* 29.8* 28.4*  MCV 98.8* 98.3* 98.5*  PLT 245 267 254    Recent Labs    12/23/23 1307 12/24/23 0524 12/25/23 0400  NA 134* 136 132*  K 4.7 4.6 4.7  CL 97* 98 97*  CO2 32* 30 28  BUN 37* 36* 31*  CREATININE 1.24 1.02 0.99  GLUCOSE 314* 327* 249*  CALCIUM  10.1 9.7 9.4    Recent Labs    12/23/23 0449 12/24/23 0524 12/25/23 0400  MG 1.7* 1.7* 2.1    No results for input(s): AST, ALT, BILITOT, PROT, ALBUMIN, ALP in the last 72 hours.   No results found for this visit on 12/01/23 (from the past week).   Electronically signed by: Venson Slater Brooking, MD 12/25/2023 12:04 PM

## 2023-12-27 NOTE — Progress Notes (Signed)
°   12/27/23 1540  Reason Eval/Treat Not Completed  Reason Eval/Treat Not Completed Patient Unavailable (Working with OT.)

## 2024-01-03 NOTE — Progress Notes (Signed)
 ------------------------------------------------------------------------------- Attestation signed by Lauraine Harrison Lent, MD at 01/03/2024  7:20 PM I have seen and examined the patient, and I have reviewed the resident's note and agree with the pertinent history physical exam findings except where specifically noted below.  I have discussed the assessment and plan with the resident with my rationale and recommendations expressed within the note or as documented below. -------------------------------------------------------------------------------  General Medicine [facility] Progress Note  Admit Date:12/01/2023 12:58 AM; LOS:33  ASSESSMENT AND PLAN  Hospital Course: Carl Reed is a 58 y.o. male with PMHx of T1DM c/b DKA, HTN, alcohol dependence, HIV, positive PPD s/p treatment presenting from SNF [facility]) with AMS. Found to be hyperglycemic and hypernatremic on admission. Mental status waxing and waning throughout early hospitalization. He had episodes of agitation requiring soft restraints and a lap belt. These were discontinued on 9/5.   #AMS most consistent with delirium  - Acute onset of AMS following recent hospitalization with metabolic encephalopathy, required intubation and zyprexa d/t agitation - Initially suspected to be due to metabolic cause, but it continued after resolution of those abnormalities, worse at night with fluctuating attentiveness makes new most likely etiology delirium  - CT head with no acute abnormalities - Stop antipsychotic meds started at last hospitalization (amantadine, zyprexa) - Agitation improved and restraints removed on 9/5 - 09/15 Transitioned from in person to video sitter - EEG with mild to moderate encephalopathy, possible result of medication effects, metabolic abnormalities or neuronal dysfunction.  - 09/18 was started on in person sitter - 09/19 Patient continued to experience staring spells leading to neuro consult. - Interval  MRI obtained 9/23 unremarkable - 9/30 overnight required in person sitter as pt was found sitting on floor  PLAN - Reorient, delirium protocols.  - Continue Seroquel 75mg  at night, 25mg  during the day - If worsening or continued agitation, consider increase Seroquel dosage - Scheduled melatonin - Neuro consult, appreciate recs.  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)  - Will follow up outpatient for further workup  - Nutrition following: tube feed was transitioned to Level 4 diet  -keep G tube I/s/o waxing/waning mentation  -9/30 SLP advanced to Level 6 diet - Palliative consulted for further assistance with GOC discussion  -Wish for all medical interventions and STR placement following d/c -Discharge barrier: finding SNF placement  -In person sitter (will require 24 hours without it)  #Hyperglycemia iso T1DM and Tube feeds #AKI (resolved) #hyperkalemia (resolved) - Home insulin  regimen: lantus  30u daily, lispro correctional (unsure of adherence) - Patient found to be altered at facility with elevated BG (undetectable) BG up to 943 on admit, however no AG or ketosis, thus not favoring ketoacidosis - VBG with pH 7.30 - Cr 2.3, up from baseline ~1.0  - K elevated to 6.8 - now s/p 10 u insulin , 1L IVF in ED  - Came off endotool on 8/31 but became hyperglycemic rapidly upon switching to subcutaneous insulin  - AKI and hyperkalemia resolved 9/1 - BHB negative - 09/25 Patient has been tolerating PO feeds, given fluctuation of glucose levels tube feeds were discontinued.  PLAN - GMT following for glycemic management, appreciate their recs, 9/28 did sign off - Basal: Lantus  12 units HS - Prandial: Lispro 6 units TIDPC - Correction: Lispro to 0-6 units TIDPC - F/u C-peptide - 4 times daily before meals and at bedtime POC glucose checks - Hold home ACEi  # Hypernatremia (resolved) -  Pt hypernatremic to 147  (corrected 152) on 8/31, Pt appears asymptomatic - Free water deficit of 1.6L - Pt recently taken off IVF and not getting significant FWFs with feeds - NS inadvertently continued for all of 8/31, stopped on 9/1 - Corrected hypernatremia on 9/1: 151 - Resolved 9/3 -- PLAN - Monitor BMPs - Free water flushes 30 ml q4 hours  #Concern for UTI (resolved) - UA with some possible infectious signs, unable to assess symptoms with AMS - Urine Culture with <10k CFUs - Received Rocephin 5 day course - Pt without symtpoms on 9/3 -- PLAN:  - Monitor for symptoms  #Hx Alcohol Use #Macrocytic anemia - Hb 10.7 on admit - Agitated overnight of 8/31 requiring diazepam  addition and lap belt restraint - Unlikely to be going through alcohol withdrawals due to recent hospitalization and facility stay immediately prior to this hospitalization, unable to truly assess last drink -- PLAN - Wernicke dose thiamine , home folic acid   - daily CBC - cont home iron supplement  #Hypoglycemia resolved -09/18 Had a BG of 17 needing dextrose    Plan - Continue to monitor  - GMT following  Chronic Medical Problems: HIV - cont Dovato   HTN - holding coreg, HOLD lisinopril   Dysphagia - has PEG tube in place, dietician consult once hyperglycemia resolves Mood - cont SSRI  Discharge Planning: - PT/OT recommending skilled rehab, patient is currently with a VPO of sitter rather than in person sitter  FENGI: Glucerna Shake; Twice daily with Breakfast/Supper Adult Diet- Dysphagia; Thin liquids (no thickener added); Soft and bite sized foods (Level 6); Med (45-60 gm/meal),  DVT ppx: heparin  sq Disposition: TBD Code status: Full  SUBJECTIVE  Interval History:  Thu 01/03/2024 Overnight: Pt found sitting on floor off of bed, required inpatient sitter Patient reports feeling overall well.  He is alert and oriented by 2 (name and place, not year).  He denies any abdominal pain. His objective is limited, however he  is interactive and more conversant than he has been in the past.    OBJECTIVE  Physical Exam: BP 141/78 (BP Location: Right arm, Patient Position: Lying)   Pulse 93   Temp 97.9 F (36.6 C) (Axillary)   Resp 18   Ht 1.803 m (5' 11)   Wt 56.9 kg (125 lb 7 oz)   SpO2 100%   BMI 17.49 kg/m   LINES/DRAINS: PIVs  General: Alert, resting comfortably in no acute distress  Cardiovascular: Regular rate and rhythm. no LE edema.  Chest/Lungs: No increased work of breathing, on RA  Abdomen: Positive bowel sounds. Abdomen soft, nondistended, nontender. Peg tube in place  Neuro: Awake, alert, spontaneously moves all extremities  Psych: Calm, no agitation, requiring video sitter   Labs, Imaging and Results Labs, radiology images, medication and microbiology have been reviewed and can be viewed in the EMR.  Pertinent findings discussed below or in the assessment and plan.  Labs and imaging: Recent Labs    01/01/24 0510 01/02/24 0421 01/03/24 0333  WBC 5.30 6.10 5.60  HGB 9.1* 9.2* 9.3*  HCT 27.4* 28.0* 27.8*  MCV 99.2* 97.7* 97.9*  PLT 374 403 430    Recent Labs    01/01/24 0510 01/02/24 0421 01/03/24 0333  NA 134* 134* 136  K 4.6 4.8 4.7  CL 97* 98 100  CO2 28 26 27   BUN 31* 30* 24  CREATININE 1.03 0.91 0.95  GLUCOSE 240* 221* 106*  CALCIUM  9.8 9.7 9.8    Recent Labs  01/01/24 0510 01/02/24 0421 01/03/24 0333  MG 1.8* 1.8* 1.9    No results for input(s): AST, ALT, BILITOT, PROT, ALBUMIN, ALP in the last 72 hours.   No results found for this visit on 12/01/23 (from the past week).   Electronically signed by: Doc Tressa Fairly, MD 01/03/2024 6:28 AM

## 2024-01-03 NOTE — Progress Notes (Addendum)
 Physician's Medical Necessity Certification Form (Required for Medicare / Medicaid Beneficiaries for non-emergency, scheduled or non-scheduled, ambulance transportation)  Ambulance Company and Contact #: [facility]  [phone])  Patient's Name: Carl Reed [facility] Social Security Number: [id number] Address: [address] [place] [address] Phone Number: [phone] (home)  Transfer Information  Date of Service: 01/05/24 Origin of Transport:  [facility] Transport Destination:  [facility] and [place] snf Phone: [phone] City: [place] State: [place] Please Check: Skilled Nursing Facility  Medical Guidelines  Medicare Regulations, Part 410.40 (d)(1) defines Medical Necessity and Bed Confined In order for ambulance service to be covered, they must be medically necessary and reasonable.  Medical necessity is established when the patients' condition is such that transportation by other means is contraindicated.  The Health Care Financing Administration has defined Bed Confinement as: Patient is unable to get up from bed without assistance Patient is unable to ambulate Patient is unable to sit in a chair or wheelchair  If the said patient does not meet Bed Confined criteria as defined in the Medicare regulation, can this patient be transported safely unmonitored in a wheelchair van?  No  If No, please indicate the appropriate medical condition(s):  Severe Weakness, Other (Comment) (AMS most consistent with delirium  - needs 1 on 1 supervision, severe deconditioning due to hospitalization) T1DM c/b DKA, brittle diabetic ---------------------------------------------------------------------------------------------------------------------- I certify that the above information is true and correct based on my evaluation of this patient to the best of my knowledge and professional training, as supported in the medical record of this patient.    I understand that this information will be used by the Department of Health and Health And Safety Inspector, Healthcare Financing Administration to support the determination of medical necessity for ambulance services.  I certify that the above information is true and correct based on my evaluation of this patient to the best of my knowledge and professional training, as supported in the medical record of this patient.  It is understood that I, the Medical Professional; being a Designer, Jewellery, Case Designer, Television/film Set, has received a verbal order from the assigned physician, being Dr. Lauraine Lent on the Date of 01/05/24, to authorize the medical necessity for the transport of the said patient above.  I understand that this information will be used by the Department of Health and Health And Safety Inspector, Healthcare Financing Administration to support the determination of medical necessity for ambulance services.    Signature of Medical Professional:  Mliss Kitty, MSW Date:  01/05/24

## 2024-01-04 NOTE — Progress Notes (Signed)
 Acute Physical Therapy Treatment  Visit Count: PT Visit Count: 3  Precautions: Other Therapy Precautions: Fall risk Comment: AMS; PEG tube; Follows One Step Commands about 50% with  increased time for processing  Medical Diagnosis/Course: PT Diagnosis/Course Pertinent Medical Course: Clipped from Gen Medicine Progress Note on 10/3: Carl Reed is a 58 y.o. male with PMHx of T1DM c/b DKA, HTN, alcohol dependence, HIV, positive PPD s/p treatment presenting from SNF [facility]) with AMS. Found to be hyperglycemic and hypernatremic on admission. Mental status waxing and waning throughout early hospitalization. He had episodes of agitation requiring soft restraints and a lap belt. These were discontinued on 9/5.  Assessment and Plan  Assessment: Received pt in long sitting position, alert but pleasantly confused, oriented only to self. Pt demonstrates ongoing progress in functional mobility despite confusion, increase time with mobility but following 1-step commands. Pt requires not more than SUP with bed mobility and not more than CGA>min A with transfers using RW, improved activity tolerance as evidenced by increased gait distance of 12ft x 2sets with min/CgA and very close wc follow, antalgic pattern with L step length >R but pt unable to pinpoint exactly where he is having LLE pain which is also noted during therapeutic exercises. Pt denies any lightheadedness/dizziness throughout PT session even with OOB activities. Patient scored 19/24 from 11/24  on the [facility] Mobility Assessment indicating 41.77% impairment of functional mobility.   Problem list: Impairments/Limitations: Activity Tolerance Deficits, Balance Deficits, Muscle weakness, Mobility deficits, Tone deficits, Safety Awareness deficits   PT Recommendation: PT Recommendation: Rehabilitation Facility PT Equipment Recommended: Defer to next level of care  Home Living Environment: Type of Home Skilled Nursing Facility (per  chart, pt from [facility])  Home Layout    Exterior Stairs - number    Exterior Stairs - rails    Interior Stairs - number    Interior Stairs - Administrator, Sports / Tub    Secretary/administrator point  Equipment Currently Using when R hip activing up - patient uses SPC; otherwise independent with bed mobility, transfers, and gait without an AD  Additional Comments     Prior Level of Function: Level of Independence Independent  Lives With    [employer]) able to assist at d/c Currently at [facility] rehab; Daughter/SIL (PRN)  Patient Responsibilities Social Participation, Personal ADLs (unknown)  Requires Assist With     PT Goals:  Patient and Family Goals: to return to PLOF Treatment Plan/Goals Established with Patient/Caregiver: Patient unable to give input (2/2 cognitive deficits)  Encounter Problems     Encounter Problems (Active)     Physical Therapy     Patient will go supine to/from sit with distant supervision     Start:  12/06/23    Expected End:  01/30/24      9/26: Min-Mod A this date. JLC     Goal note on 12/19/23 1433 by Charleen Mule, PT     9/17:min A. ko         Patient will perform sit-to-stand with close supervision using LRAD     Start:  12/06/23    Expected End:  01/30/24      12/28/23: Min A x 1-2 this date. JLC     Goal note on 12/19/23 1433 by Charleen Mule, PT     9/17:min A. ko         Patient will ambulate at least 50 feet with  contact guard assistance using LRAD     Start:  12/06/23    Expected End:  01/30/24      9/8 - 25 feet with RW - Mod A x 1.  12/28/23: 2' forward/backward with RW and Min A x 2. JLC    Goal note on 12/31/23 1233 by Larraine Jeronimo Essex, PT     9/29 - 10 -15 feet with RW vs no AD (Mod A x 1).         Patient/caregiver will be able to verbalize 3 fall prevention strategies to decrease risk of falls during functional mobility     Start:  12/06/23     Expected End:  01/30/24      12/28/23: Unable 2/2 cognitive deficits. JLC.    Goal note on 12/19/23 1433 by Charleen Mule, PT     9/17:unable. ko         ampac mobility score to improve from 13 to at least 21/24 showing increase independence in functional mobility and decrease risk for falls     Start:  12/06/23    Expected End:  01/30/24      12/28/23: Patient scored 11/25 this date. JLC    Goal note on 12/19/23 1433 by Charleen Mule, PT     9/17:14/24. ko             Rehab Potential:  Rehab Potential: Good Complexity/Co-morbidities that Impact POC: Alcohol use, Drug use, Cognitive/Memory deficits, Reduced activity level Impairments/Limitations: Activity Tolerance Deficits, Balance Deficits, Muscle weakness, Mobility deficits, Tone deficits, Safety Awareness deficits  Plan:  Patient's Prior Level of Function Independent Independent / Mod I (AMPAC 22-24)     (3pts)   Patient's Current Level of Function (19) Needs a Little Help (AMPAC 12-21)    (2pts)   Likelihood for functional decline without therapy .  Highly Likely   (3pts)  Likelihood  to make significant functional gains with therapy. Fairly Likely    (2pts)  Acute therapy likely to change discharge disposition. Not Likely   (1pt)   Recommended Frequency: 3-4 times a week (8-12 points total)  PT Frequency: 3x week  Recommend Duration:  For 2 weeks  Recommend Interventions: Patient/Caregiver education, Therapeutic activity, Therapeutic exercise, Gait/mobility training, Neuromuscular re-education, Wheelchair management/mobility   SUBJECTIVE  Interdisciplinary Team Communication Communication: Patient, Nursing Communication Details: RN consented to PT session   General Family/Caregiver Present: none Was an interpreter used?: No  Subjective: pt is receptive to PT session, pleasantly confused.  Pain: Pain Assessment Pain Assessment: 0-10 Pain Score  :  (0 at rest up to 10/10 with certain  movements) Pain Location:  (pt unable to pinpoint exactly where he is hurting but noted grimacing during LLE ex) Pain Orientation: Left Pain Descriptors: Aching, Discomfort Pain Frequency: Intermittent Effect of Pain on Daily Activities: antalgic gait Pain Intervention(s): Rest, Distraction, RN notified (Comment) Response to Interventions: relief at rest Multiple Pain Sites: No   OBJECTIVE  Cognition Orientation Level: Not oriented Not Oriented to: Place, Time, Situation (reoriented by PT) Affect/Behavior: Confused, Flat (participative) Safety Awareness: Impaired  Vitals:   Therapy Activity Vitals - Post Activity Post Ambulation/Activity Position: Sitting Post Ambulation/Activity Heart Rate: 106 Post Ambulation/Activity BP: 144/79 (map 92) BP Location: Right arm BP Method: Automatic Post Ambulation/Activity SpO2: 96 % Post Ambulation/Activity O2 Device: None (Room air)  Balance: Sitting - Static: Close supervision Sitting - Dynamic: Close supervision Standing - Static: Contact Guard Assistance, Verbal Cues Standing - Dynamic: Minimal Assistance, Moderate Assistance, Verbal Cues, Tactile  cues, Visual Cues Balance Additional Comments: use of RW during standing activities with mosty CGA>minA except during backward stepping technique x 1 mod A; cues for proper mgt of RW and sequence of movements for safety  Skin Integrity and Edema:  Skin Integrity & Edema Skin: Intact in visualized areas Exceptions: dry scaly peeling skin on hands and feet Edema: No edema   Interventions: Therapeutic Exercise: directed pt in LE exercises towards SLR, hip abd/add, hip and knee flexion/ext, ankle DF/PF each x 15reps x 2sets on RLE and 10-15reps x 2sets on LLE, with cues for proper form, and compensatory strategies on LLE to decrease exacerbation of discomfort, promoting strength needed to increase independence in functional mobility.   Therapeutic Activity:  Bed Mobility Supine to Sit: Close  supervision, Extra time, Verbal Cues Sit to Supine: Close supervision, Extra time, Verbal Cues Bed Mobility Additional Comments: vc for task initiation; increase time for mobility Transfers Assistive Device: Walker-rolling Sit to Stand: [place], Verbal Cues, Tactile cues, Extra time Stand to Sit: Verbal Cues, Contact Guard Assistance, Minimal Assistance, Extra time, Tactile cues Bed to/from Chair - Level of Assistance: Geophysicist/field Seismologist, Extra time, Verbal Cues, Tactile cues Transfers  Additional Comments: vc/tc for proper hand placement and PBM and proper proximity to the surface especially during descent to decrease risk for falls  Gait Training: Gait Deviation: Decreased stride length- Right, Decreased Gait Speed, Cadence- Decreased, Decreased Stance Time-Left Assistive Device: Walker-rolling Gait Assistance: Minimal Assistance, Geophysicist/field Seismologist, Verbal Cues, Extra time, Tactile cues Gait Additional Comments: 20ft x 2sets with use of RW, vc for proper proximity to AD to improve standing posture and balance; L>R step length, slightly improved R step length upon vc. occasional min A for proper mgt of RW    Skill Performed by Clinician:  Education on proper use of assistive device/adaptive equipment Education on precautions for safe performance of functional tasks Education on safe/proper technique Facilitation for proper positioning/movement in preparation for functional tasks Monitoring exercise/activity tolerance Monitoring safe progression of exercise/activity Tactile cues for correction of atypical postures or movements Tactile cues for proper technique Tactile cues for safe execution of functional tasks Verbal cues for proper technique Verbal cues for safe execution of functional tasks     Outcomes AM-PAC - Basic Mobility:    Flowsheet Row Most Recent Value  AM-PAC 6-Clicks - Basic Mobility  Turning from you back to your side while in a flat  bed without using bed rails? None  Moving from lying on your back to sitting on the side of a flat bed without using bed rails? None  Moving to and from a bed to a chair (including a wheelchair)? A little  Standing up from a chair using your arms (e.g, wheelchair, or bedside chair)? A little  To walk in a hospital room? A little  Climbing 3-5 steps with a railing? A lot  AM-PAC Total Score 19   Condition After Therapy:  Back to bed, Nursing notified of condition, All needs within reach, Fall interventions in place, Alarm activated  Treatment Times Time In: 1440 Time Out: 1520 Time in Timed codes: 40 Total Treatment Time: 40     Treatment/Procedures Time Entry Gait/Mobility minutes: 15 PT Therapeutic Exercise minutes: 13 Therapeutic Activity minutes: 12   Charges           01/04/2024   Code Description Service Provider Modifiers Quantity  [id number] Hc Pt Gait Training Therapy [place] DELENA Grills, PT GP 1  [id number] Hc Pt Therapeutic  Exercises [place] DELENA Grills, PT GP 1  [id number] Hc Pt Therapeut Actvity Direct Pt Contact Each 15 Min Ke[place] DELENA Grills, PT GP 1        Time of Service Note Type Status  None

## 2024-01-04 NOTE — Progress Notes (Signed)
 Acute Occupational Therapy Treatment  Visit Count: OT Visit Count: 4  Precautions: Other Therapy Precautions: Fall risk Comment: AMS; PEG tube; Follows One Step Commands about 50% with  increased time for processing  Medical Diagnosis/Course: OT Diagnosis/Course OT Medical Dx: hyperosmolar hyperglycemic state Pertinent Medical Course: Clipped from Gen Medicine Progress Note on 10/3: Carl Reed is a 58 y.o. male with PMHx of T1DM c/b DKA, HTN, alcohol dependence, HIV, positive PPD s/p treatment presenting from SNF [facility]) with AMS. Found to be hyperglycemic and hypernatremic on admission. Mental status waxing and waning throughout early hospitalization. He had episodes of agitation requiring soft restraints and a lap belt. These were discontinued on 9/5.    Assessment and Plan  Assessment: Carl Reed is 58 y.o. presenting on 12/01/2023. Pt is presenting following admission for hyperosmolar hyperglycemic state.   Pt received supine and agreeable to occupational therapy session. Todays occupational therapy treatment focused on feeding, LB dressing, cognition, command following, reorientation, ambulation, bed mobility, standing balance, and grooming.  Pt able to complete bed mobility with supervision assistance and functional transfers with moderate assistance. Pt needing minimum assistance with UB ADLs and moderate assistance with LB ADLs most impacted by cognition with impaired problem solving and processing observed although pt gives good effort. Patient is progressing in therapy evidenced by improved activity tolerance for OOB tasks. Pt still demonstrates deficits in balance, mobility, muscle weakness, cognition, activity tolerance, and pain affecting functional mobility and ADL task completion. Based on clinical findings, patient will continue to benefit from skilled occupational therapy services to address stated impairments, with goal of improving activity limitations  and overall function. See below for discharge recommendations.    Problem List:  Impairments/Limitations: Activity Tolerance Deficits, Balance Deficits, Basic Activity of Daily living deficits, Cognitive Deficits, Coordination Deficits, Decreased knowledge of condition, Feeding Deficits, Muscle weakness, Mobility deficits, IADL deficits, Safety Awareness deficits  OT Recommendation: Recommendation OT Recommendation: Rehabilitation Facility OT Equipment Recommended: Defer to next level of care  Home Living Environment: Type of Home Skilled Nursing Facility (per chart, pt from [facility])  Home Layout    Exterior Stairs - Research Scientist (life Sciences) Stairs - number    Interior Stairs - Location Manager Stairs - number    Shower/Tub    Secretary/administrator point  Equipment Currently Using when R hip activing up - patient uses SPC; otherwise independent with bed mobility, transfers, and gait without an AD  Additional Comments     Prior Level of Function:  Level of Independence Independent  Lives With    [employer]) able to assist at d/c Currently at [facility] rehab; Daughter/SIL (PRN)  Patient Responsibilities  Social Participation, Personal ADLs (unknown)  Requires Assist With      OT Goals: Encounter Problems     Encounter Problems (Active)     Occupational Therapy     Patient will perform bathing with moderate assistance     Start:  12/06/23    Expected End:  01/05/24      Kerman 01/04/2024 - goal not addressed - JS       Patient will perform grooming with close supervision     Start:  12/06/23    Expected End:  01/05/24      01/04/24 - set up face washing - JS      Patient will perform feeding with close supervision     Start:  12/06/23  Expected End:  01/05/24      01/04/24 - set up for drinking - JS       Patient will perform upper body dressing with minimal assistance     Start:  12/06/23    Expected End:  01/05/24       Kerman 01/04/2024 - goal not addressed - JS       Patient will perform lower body dressing with minimal assistance     Start:  12/06/23    Expected End:  01/05/24      01/04/24 - modA socks - JS      Patient will perform toileting with minimal assistance     Start:  12/06/23    Expected End:  01/05/24      Kerman 01/04/2024 - goal not addressed - JS       Patient will perform toilet transfer with minimal assistance to [facility]     Start:  12/06/23    Expected End:  01/05/24      Fri 01/04/2024 - goal not addressed - JS       Patient/caregiver will demonstrate home exercise program independently     Start:  12/06/23    Expected End:  01/05/24      Fri 01/04/2024 - goal not addressed - JS         Patient will improve functional cognition as demonstrated by following 80% of commands consistently during self care tasks.       Start:  12/06/23    Expected End:  01/05/24      01/04/24 - about 60%, one step - JS      Patient/caregiver will be able to verbalize 2 fall prevention strategies to decrease risk of falls during functional mobility     Start:  12/06/23    Expected End:  01/05/24      Kerman 01/04/2024 - goal not addressed - JS       OT/OTA will monitor for need to administer formal cognitive assessment as appropriate and update POC as indicated.        Start:  12/06/23    Expected End:  01/05/24      Kerman 01/04/2024 - goal not addressed - JS       Patient's ADLs Inpatient Short Form score will increase to 18/24 to indicate improved function.      Start:  12/06/23    Expected End:  01/05/24      01/04/24 - 14/24 - JS          Rehab Potential: Rehab Potential Rehab Potential: Fair Complexity/Co-morbidities that Impact POC: Cognitive/Memory deficits, Reduced activity level, Diabetes Impairments/Limitations: Activity Tolerance Deficits, Balance Deficits, Basic Activity of Daily living deficits, Cognitive Deficits, Coordination Deficits, Decreased knowledge of condition, Feeding  Deficits, Muscle weakness, Mobility deficits, IADL deficits, Safety Awareness deficits  Plan:  OT Frequency: 3x week  Recommend Duration:  OT Duration: For 4 weeks  Recommend Interventions: Planned Treatment/Interventions: Basic activities of daily living, Cognitive training, Edema management, Functional Mobility training, Instrumental Activities of Daily Living, Patient/Caregiver education, Therapeutic activities, Therapeutic exercises  SUBJECTIVE  Interdisciplinary Team Communication Communication: Nursing, Patient Communication Details: RN consented to session and updated after.   Subjective: I need help during LB dressing  Pain: Pain Assessment Pain Assessment: Wong-Baker FACES Wong-Baker FACES Pain Rating: Hurts little more Pain Location: Ankle Pain Orientation: Left Pain Intervention(s): Distraction, Rest Response to Interventions: reported pain during LB dressing  OBJECTIVE  Cognition Orientation Level: Not oriented Not Oriented to: Situation, Time Affect/Behavior:  Impacted therapy session Affect/Behavior: Flat, Confused (participative) Overall Cognitive Status: Impaired Arousal/Alertness: Appropriate responses to stimuli Attention: Attends with cues to redirect Memory: Decreased recall of biographical information, Decreased recall of recent events, Decreased short term memory Following Commands: Inconsistent following commands, Follows only one step commands, Requires increased time Safety Awareness: Impaired Insight: Decreased awareness of deficits Problem Solving: Assistance required to generate solutions, Assistance required to implement solutions Executive Functioning: Impaired Executive Functioning Impairment: Firefighter, Information Processing, Reasoning, Flexibility   Vitals:  Vital signs chart reviewed prior to session and within normal limits. No signs or symptoms of unstable vitals during session and did not further assess.    Interventions: Functional Mobility:   Bed Mobility Supine to Sit: Close supervision, Verbal Cues Sit to Supine: Close supervision, Verbal Cues Bed Mobility Additional Comments: cues for task initiation Transfers Assistive Device:  (bedrail, gait belt assist) Sit to Stand: Contact Guard Assistance Stand to Sit: Geophysicist/field Seismologist Transfers  Additional Comments: Compelted multiple sit <> stand tials from EOB with LHHA and R bed rail assist. Pt able tside step with mod to maxA with max cues for sequencing. Rest breaks provided between trials. Mobility Gait Assistance: Moderate Assistance, Maximal Assistance Assistive Device: None OT Mobility Additional Comments: OT facilitated functional mobility for the purpose of ADL task completion.     Balance Sitting - Static: Close supervision Sitting - Dynamic: Close supervision Standing - Static: Contact Guard Assistance Standing - Dynamic: Moderate Assistance Balance Additional Comments: Close superivsion due to impulsive actions requiring close sup for safety. In standing, pt needing modA during ambulation to prevent LOB during bilateral steps at EOB. Self Care / Home Management: ADLs Feeding: Set up (soda over ice, patient observed to be able to bring cup to mouth with increased time) Grooming: Close supervision (face washing) Grooming Level: Edge of bed LB Dressing: Moderate Assistance, Extra time, Verbal Cues, Tactile cues, Visual Cues (Patient needing mod a to maintain figure 4position during donning and doffing of socks, cues for error correction and problem-solving; on right foot needed distal assistance; significant increased time, with patient making good effort to problem solve) LB Dressing Level: Edge of bed, Sitting Assistive Device:  (bedrail, gait belt assist)   Education Provided: Role of OT/ POC, Importance of increasing activity level/ participation in therapy, Call for and wait for assistance with transfers/mobility,  Adaptive ADL strategies, and Fall Prevention      Response to Intervention: Arousal Increased , Improved Functional Mobility , and Required therapeutic rest breaks   Outcomes AM-PAC - Daily Activity:    Flowsheet Row Most Recent Value  AM-PAC 6-Clicks - Daily Activity  Lower Body Clothing Activity A lot  Bathing A lot  Toileting A lot  Upper Body Clothing Activity A lot  Personal Grooming A little  Eating Meals A little  AM-PAC Total Score 14    Condition After Therapy: Condition After Therapy: Back to bed, All needs within reach, Fall interventions in place, Nursing notified of condition, Alarm activated, Family present in room, Lines intact  Treatment Times Time In: 1226 Time Out: 1249 Time in Timed codes: 23 Total Treatment Time: 23     Treatment/Procedures Time Entry Self Care/Home Management Training minutes: 23   Charges           01/04/2024   Code Description Service Provider Modifiers Quantity  [id number] Hc Ot Self-Care/Home Mgmt Training Each 15 Minutes Recardo FORBES Hurl, OT/L GO 2        Time of Service Note Type  Status  None

## 2024-01-04 NOTE — Progress Notes (Signed)
 ------------------------------------------------------------------------------- Attestation signed by Lauraine Harrison Lent, MD at 01/04/2024  4:46 PM I have seen and examined the patient, and I have reviewed the resident's note and agree with the pertinent history physical exam findings except where specifically noted below.  I have discussed the assessment and plan with the resident with my rationale and recommendations expressed within the note or as documented below.  -------------------------------------------------------------------------------  General Medicine Daniel II Progress Note  Admit Date:12/01/2023 12:58 AM; LOS:34  ASSESSMENT AND PLAN  Hospital Course: Carl Reed is a 58 y.o. male with PMHx of T1DM c/b DKA, HTN, alcohol dependence, HIV, positive PPD s/p treatment presenting from SNF [facility]) with AMS. Found to be hyperglycemic and hypernatremic on admission. Mental status waxing and waning throughout early hospitalization. He had episodes of agitation requiring soft restraints and a lap belt. These were discontinued on 9/5.   #AMS most consistent with delirium  - Acute onset of AMS following recent hospitalization with metabolic encephalopathy, required intubation and zyprexa d/t agitation - Initially suspected to be due to metabolic cause, but it continued after resolution of those abnormalities, worse at night with fluctuating attentiveness makes new most likely etiology delirium  - CT head with no acute abnormalities - Stop antipsychotic meds started at last hospitalization (amantadine, zyprexa) - Agitation improved and restraints removed on 9/5 - 09/15 Transitioned from in person to video sitter - EEG with mild to moderate encephalopathy, possible result of medication effects, metabolic abnormalities or neuronal dysfunction.  - 09/18 was started on in person sitter - 09/19 Patient continued to experience staring spells leading to neuro consult. - Interval  MRI obtained 9/23 unremarkable - 9/30 overnight required in person sitter as pt was found sitting on floor  PLAN - Reorient, delirium protocols.  - Continue Seroquel 75mg  at night, 25mg  during the day - If worsening or continued agitation, consider increasing Seroquel dosage - Scheduled melatonin - Neuro consult, appreciate recs.  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)  - Will follow up outpatient for further workup  - Nutrition following: tube feed was transitioned to Level 4 diet  -keep G tube I/s/o waxing/waning mentation  -9/30 SLP advanced to Level 6 diet - Palliative consulted for further assistance with GOC discussion  -Wish for all medical interventions and STR placement following d/c -Discharge planning: SNF placement  -In person sitter (will require 24 hours without it)   -D/c 11pm 10/2  -Lamuvidine and Dolutegravir  prescribe DPS or have wife pick up at pharmacy per ID  #Hyperglycemia iso T1DM and Tube feeds #AKI (resolved) #hyperkalemia (resolved) - Home insulin  regimen: lantus  30u daily, lispro correctional (unsure of adherence) - Patient found to be altered at facility with elevated BG (undetectable) BG up to 943 on admit, however no AG or ketosis, thus not favoring ketoacidosis - VBG with pH 7.30 - Cr 2.3, up from baseline ~1.0  - K elevated to 6.8 - now s/p 10 u insulin , 1L IVF in ED  - Came off endotool on 8/31 but became hyperglycemic rapidly upon switching to subcutaneous insulin  - AKI and hyperkalemia resolved 9/1 - BHB negative - 09/25 Patient has been tolerating PO feeds, given fluctuation of glucose levels tube feeds were discontinued.  PLAN - GMT following for glycemic management, appreciate their recs, 9/28 did sign off - Basal: Lantus  11 units HS - Prandial: Lispro 6 units TIDPC - Correction: Lispro to 0-6 units TIDPC - F/u C-peptide -  4 times daily before meals and at  bedtime POC glucose checks - Hold home ACEi  # Hypernatremia (resolved) - Pt hypernatremic to 147 (corrected 152) on 8/31, Pt appears asymptomatic - Free water deficit of 1.6L - Pt recently taken off IVF and not getting significant FWFs with feeds - NS inadvertently continued for all of 8/31, stopped on 9/1 - Corrected hypernatremia on 9/1: 151 - Resolved 9/3 -- PLAN - Monitor BMPs - Free water flushes 30 ml q4 hours  #Concern for UTI (resolved) - UA with some possible infectious signs, unable to assess symptoms with AMS - Urine Culture with <10k CFUs - Received Rocephin 5 day course - Pt without symtpoms on 9/3 -- PLAN:  - Monitor for symptoms  #Hx Alcohol Use #Macrocytic anemia - Hb 10.7 on admit - Agitated overnight of 8/31 requiring diazepam  addition and lap belt restraint - Unlikely to be going through alcohol withdrawals due to recent hospitalization and facility stay immediately prior to this hospitalization, unable to truly assess last drink -- PLAN - Wernicke dose thiamine , home folic acid   - daily CBC - cont home iron supplement  #Hypoglycemia resolved -09/18 Had a BG of 17 needing dextrose    Plan - Continue to monitor  - GMT following  Chronic Medical Problems: HIV - cont Dovato   HTN - holding coreg, HOLD lisinopril   Dysphagia - has PEG tube in place, dietician consult once hyperglycemia resolves Mood - cont SSRI  FENGI: Glucerna Shake; Twice daily with Breakfast/Supper Adult Diet- Dysphagia; Thin liquids (no thickener added); Soft and bite sized foods (Level 6); Med (45-60 gm/meal),  DVT ppx: heparin  sq Disposition: TBD Code status: Full  SUBJECTIVE  Interval History:  Fri 01/04/2024 Overnight: NAEO. Sometimes attempt to leave bed but redirectable Patient reports feeling overall well. He is alert and oriented by 2 (name and place, not year).  He is interactive and more conversant than he has been in the past.   OBJECTIVE  Physical Exam: BP  133/77 (BP Location: Right arm, Patient Position: Lying)   Pulse 100   Temp 97.8 F (36.6 C) (Oral)   Resp 19   Ht 1.803 m (5' 11)   Wt 53 kg (116 lb 12.8 oz)   SpO2 100%   BMI 16.29 kg/m   LINES/DRAINS: PIVs  General: Alert, resting comfortably in no acute distress  Cardiovascular: Regular rate and rhythm. no LE edema.  Chest/Lungs: No increased work of breathing, on RA  Abdomen: Positive bowel sounds. Abdomen soft, nondistended, nontender. Peg tube in place  Neuro: Awake, alert, spontaneously moves all extremities  Psych: Calm, no agitation, requiring video sitter   Labs, Imaging and Results Labs, radiology images, medication and microbiology have been reviewed and can be viewed in the EMR.  Pertinent findings discussed below or in the assessment and plan.  Labs and imaging: Recent Labs    01/02/24 0421 01/03/24 0333 01/04/24 0539  WBC 6.10 5.60 5.80  HGB 9.2* 9.3* 9.2*  HCT 28.0* 27.8* 27.0*  MCV 97.7* 97.9* 97.9*  PLT 403 430 451*    Recent Labs    01/02/24 0421 01/03/24 0333 01/04/24 0539  NA 134* 136 133*  K 4.8 4.7 4.8  CL 98 100 98  CO2 26 27 26   BUN 30* 24 18  CREATININE 0.91 0.95 0.80  GLUCOSE 221* 106* 186*  CALCIUM  9.7 9.8 9.8    Recent Labs    01/02/24 0421 01/03/24 0333 01/04/24 0539  MG 1.8* 1.9 1.6*    No results  for input(s): AST, ALT, BILITOT, PROT, ALBUMIN, ALP in the last 72 hours.   No results found for this visit on 12/01/23 (from the past week).   Electronically signed by: Doc Tressa Fairly, MD 01/04/2024 11:10 AM

## 2024-01-04 NOTE — Care Plan (Signed)
 Problem: Safety - Adult  Goal: Free from fall injury  Description: INTERVENTIONS:  1. Assess pt frequently for physical needs  2. Identify cognitive and physical deficits and behaviors that affect risk of falls.  3. Institute fall precautions as indicated by assessment.  4. Educate pt/family on patient safety including physical limitations  5. Instruct pt to call for assistance with activity based on assessment  6. Modify environment to reduce risk of injury  7. Consider OT/PT consult to assist with strengthening/mobility  Outcome: Progressing

## 2024-01-04 NOTE — Progress Notes (Signed)
 Inpatient Care Coordination Team Conference Note  01/04/2024   Time:11:11 PM   CSN: [id number]  DOB: Aug 30, 1965   Room/Bed: A869/A LOS: 34 Payor Info: Payor: BCBS / Plan: BCBS OOS PPO / Product Type: PPO /    Admitting Diagnosis: Hyperosmolar hyperglycemic state (HHS)    (CMD) [E11.00]  Admit Date/Time: 12/01/2023 12:58 AM Admission Comments: No comment available   Primary Diagnosis: Hyperosmolar hyperglycemic state (HHS)    (CMD) Principal Problem: Hyperosmolar hyperglycemic state (HHS)    (CMD)  Predictive Model Details        26.5% (Medium)  Factor Value   Calculated 01/04/2024 20:06 11% Number of active outpatient medication orders 38   Readmission Risk Score v2 Model 11% Braden score 17    8% Latest hemoglobin in last 72 hrs 9.2 g/dL    7% Latest RDW in last 72 hrs 14.2 %    7% Number of appointments in last 90 days 11     Team Members Present: Nurse, Child Psychotherapist, Provider  Expected Discharge Date: Jan 05, 2024  Mliss Kitty, MSW

## 2024-01-04 NOTE — Progress Notes (Addendum)
 Case Management Update  Date: 01/04/2024   Time: 11:26 PM   Patient Type: Inpatient  Pt is being transferred via [facility] on Sat 01/05/24 at 1 pm to [facility] snf [phone]. Insurance shara has already been obtained by the snf. Weekend sw/cm to provide dc packet. Ambulance necessity form done and fl2 initiated. This worker asked that weekend cm/sw notify [facility] snf rep [phone] whether pt is still being dc as planned and fax dc summary to snf prior to pt's transfer. Sw also requested that weekend sw/cm  also let pt's wife Wava [place])   [phone]  know whether pt is still being dc as planned. Pt's hiv medication needs to be sent with pt to the snf. The hiv med (Dovato ) was filled via DPS and Crystal (press photographer) has put it in pt's pc bin.Sw has asked that weekend cm/sw remind Crystal on 8 AE to send the hiv medication with pt to the snf.   Sw has notified Jon Piper, pt's wife, charge nurse [employer]), and physician of above [employer]. Sw contacted pt's wife again today this afternoon and informed her that plans are still to dc pt on Sat at  1pm. Pt has been without sitter and no issues since 11 pm last night. This worker asked staff to document this in pt's chart.  Plan: weekend CM/SW to finalize above dc plans         Anticipated Discharge Location: Skilled Nursing Facility - short-term rehab  If Plan A discharging location is not feasible: Potential Plan B: Skilled Nursing Facility - short-term rehab         Mliss Kitty, MSW

## 2024-01-05 NOTE — Progress Notes (Signed)
 Case Management Update  Date: 01/05/2024   Time: 8:20 AM   Patient Type: Inpatient    Post Acute Placement Status: Coordination complete.   Anticipated Discharge Location: Skilled Nursing Facility - short-term rehab  If Plan A discharging location is not feasible: Potential Plan B: Skilled Nursing Facility - short-term rehab   Patient is scheduled to be discharged to [facility] [phone] at 1pm via [facility] [phone]. The DC summary needs to be faxed to the facility by 11am.  The nurse call report to [phone]. Per SW handoff, the charged nurse secured patient home medications and will send them with him at discharge.  CM informed the medical team.        Joetta DELENA Molt, RN   [phone]

## 2024-01-05 NOTE — Progress Notes (Addendum)
 Case Management Discharge Note        CSN: [id number] DOB: [birth year] Service: General Medicine Location: A869/A  Patient Class: Inpatient  DC Disposition: : Skilled Nursing Facility  Discharge DC Disposition: : Skilled Nursing Facility Placement Facility Type: Skilled Nursing Facility Skilled Nursing - Assisted Living Facility: [facility] Discharge Transport: Ambulance Discharge Transport Agency Chosen: [facility] [phone])      Per provider this patient is medically stable to discharge today to [facility] [phone]. CM spoke with [facility] Pi[facility] snf rep [phone] to inform the patient will be discharged as planned. Per Jon she will pull the discharge summary however, transport need to pushback to 4pm or tomorrow. Per [facility] dispatch the new transport time is 4pm with [facility] [phone]. CM spoke with the patient's spouse Merrily [phone] and informed her of the new transport time.The charged nurse secured patient home medications and will send them with him at discharge. No other discharge needs identified. Discharge summary left at the nurse's station.   Joetta DELENA Molt, RN [phone]

## 2024-01-05 NOTE — Unmapped External Note (Addendum)
°   ° ° °  [place] Medicaid Long Term Care [facility] Form   Prior Approval   IDENTIFICATION  Patient Name: Carl Reed Birthdate: 1965/10/27  SSN:  [id number]  Sex: male Admission Date (Current Location): 12/01/2023  [facility] and [place] Number: [place] / none  Facility and Address:  [facility] -  [address] [place] [address]  Provider Number: [id number]  Attending Physician Name:   Lauraine Harrison Lent,* Address:  [facility] [place] [address] Relative Name and Address:  Extended Emergency Contact Information Primary Emergency Contact: Talamantez,Serina Mobile Phone: [phone] Relation: Spouse Secondary Emergency Contact: Burnetta Bolt Relation: Daughter  Current Level of Care: Hospital Recommended Level of Care: Skilled Nursing Facility Prior Approval Number:    Date Approved/Denied:   PASRR Number: [id number]  Discharge Plan: Skilled Nursing Facility    Current Diagnoses: Principal Problem (Resolved):   Hyperosmolar hyperglycemic state (HHS)    (CMD) Active Problems: There are no active Hospital Problems.    DISORIENTED AMBULATORY STATUS BLADDER BOWEL   (alert) Semi-Ambulatory Incontinent Incontinent  INAPPROPRIATE BEHAVIOR FUNCTIONAL LIMITATIONS COMMUNICATION OF NEEDS RESPIRATION   (none) Speech (mumbles) Verbally Normal  PERSONAL CARE ASSISTANCE ACTIVITIES/SOCIAL SKIN NUTRITION STATUS  Total Care Family Supportive Normal Diet, Weight, Height (Adult Diet- Dysphagia; Thin liquids (no thickener added); Pureed foods (Level 4); Med (45-60 gm/meal) free water 30 mL / 120 lbs. / 5'11 /)   Weight: 53 kg (116 lb 12.8 oz) (01/04/2024  5:00 AM)  PHYSICIAN VISITS NEUROLOGICAL    30 days          SPECIAL CARE FACTORS FREQUENCY  PT (By Licensed PT), Occupational Therapy     PT Frequency: 5 times a week           OT Frequency: 5 times a week    MEDICATIONS: Scheduled  Meds:[Held by provider] carvediloL, 12.5 mg, oral, BID with meals dolutegravir , 50 mg, oral, Daily folic acid , 1 mg, oral, Daily free water, 30 mL, G-tube, Q4H SCH gabapentin , 300 mg, oral, TID  Or gabapentin , 300 mg, G-tube, TID heparin  (porcine), 5,000 Units, subcutaneous, Q8H SCH insulin  glargine, 11 Units, subcutaneous, At Bedtime insulin  aspart (NovoLOG )/insulin  lispro (HumaLOG ) injection (WF), 0-6 Units, subcutaneous, QID - after meals and at bedtime insulin  aspart (NovoLOG )/insulin  lispro (HumaLOG ) injection, 6 Units, subcutaneous, TID PC lamiVUDine , 300 mg, oral, Daily magnesium  oxide, 400 mg, oral, Daily melatonin, 3 mg, oral, Every evening melatonin, 3 mg, oral, Once multivitamin with minerals, 1 tablet, G-tube, Daily nicotine , 1 patch, transdermal, Daily omeprazole, 40 mg, oral, Daily 0600 polysaccharide iron complex, 150 mg of iron, oral, Daily QUEtiapine, 25 mg, G-tube, Daily QUEtiapine, 75 mg, oral, At Bedtime sertraline, 50 mg, oral, Daily thiamine , 100 mg, oral, Daily   Continuous Infusions:  PRN Meds:.  dextrose    dextrose    haloperidol lactate   loperamide   ondansetron    polyethylene glycol   sennosides-docusate sodium   Do not use this list as official medication, diet, or wound care orders. Please verify with discharge summary.  X-ray and Laboratory Findings/Date: See Clinical Information for details     ADDITIONAL INFORMATION:  BCBS  Brittle diabetic  Mliss Kitty, MSW

## 2024-01-05 NOTE — Care Plan (Signed)
°  Problem: Activity: Goal: Range of joint motion will improve Description: Range of joint motion will improve Outcome: Adequate for Discharge

## 2024-01-05 NOTE — Progress Notes (Signed)
 Case Management Update  Date: 01/05/2024   Time: 11:38 AM   Patient Type: Inpatient    Anticipated Discharge Location: Skilled Nursing Facility - short-term rehab  If Plan A discharging location is not feasible: Potential Plan B: Skilled Nursing Facility - short-term rehab        Per provider this patient is medically stable to discharge today to [facility] [phone]. CM spoke with [facility] Pi[facility] snf rep [phone] to inform the patient will be discharged as planned. Per Jon she will pull the discharge summary however, transport need to pushback to 4pm or tomorrow. Per [facility] dispatch the new transport time is 4pm with [facility] [phone]. CM spoke with the patient's spouse Carl Reed [phone] and informed her of the new transport time.  Carl DELENA Molt, RN   [phone]

## 2024-01-05 NOTE — Nursing Note (Signed)
 AVS printed and given to transport. Patient transported via [facility].

## 2024-01-05 NOTE — Unmapped External Note (Signed)
 [place] [employer] [facility] Form   Prior Approval   IDENTIFICATION  Patient Name: Carl Reed Birthdate: 02-19-1966  SSN:  [id number]  Sex: male Admission Date (Current Location): 12/01/2023  [facility] [place] Number: [place] / none  Facility and Address:  [facility] -  [address] [place] [address]  Provider Number: [id number]  Attending Physician Name:   Lauraine Harrison Lent,* Address:  [facility] [place] [address] Relative Name and Address:  Extended Emergency Contact Information Primary Emergency Contact: Pedrosa,Serina Mobile Phone: [phone] Relation: Spouse Secondary Emergency Contact: Burnetta Bolt Relation: Daughter  Current Level of Care: Hospital Recommended Level of Care: Skilled Nursing Facility Prior Approval Number:    Date Approved/Denied:   PASRR Number: [id number]  Discharge Plan: Skilled Nursing Facility    Current Diagnoses: Principal Problem (Resolved):   Hyperosmolar hyperglycemic state (HHS)    (CMD) Active Problems: There are no active Hospital Problems.    DISORIENTED AMBULATORY STATUS BLADDER BOWEL   (alert) Semi-Ambulatory Incontinent Incontinent  INAPPROPRIATE BEHAVIOR FUNCTIONAL LIMITATIONS COMMUNICATION OF NEEDS RESPIRATION   (none) Speech (mumbles) Verbally Normal  PERSONAL CARE ASSISTANCE ACTIVITIES/SOCIAL SKIN NUTRITION STATUS  Total Care Family Supportive Normal Diet, Weight, Height (Adult Diet- Dysphagia; Thin liquids (no thickener added); Pureed foods (Level 4); Med (45-60 gm/meal) free water 30 mL / 120 lbs. / 5'11 /)   Weight: 54.1 kg (119 lb 6 oz) (01/05/2024  4:30 AM)  PHYSICIAN VISITS NEUROLOGICAL    30 days          SPECIAL CARE FACTORS FREQUENCY  PT (By Licensed PT), Occupational Therapy     PT Frequency: 5 times a week           OT Frequency: 5 times a week    MEDICATIONS: Scheduled  Meds: Medication changes: Insulin  regimen as follows: Insulin  Lantus : 11 units nightly Insulin  lispro: 6 units 3 times daily after meals Insulin  lispro: 0 to 6 units BG 70 - 179: No dose needed  BG 180 - 200: 0  BG 201 - 250: 2  BG 251 - 300: 3 BG 301 - 350: 4   BG 351 - 400: 6  BG Over 400, let provider know The following medications have been discontinued: Amantadine, Ativan , Lomotil, scheduled Tylenol , olanzapine, tramadol, tadalafil Carvedilol was restarted upon discharge  Medication List       START taking these medications     * insulin  lispro 100 unit/mL injection Commonly known as: HumaLOG  Inject 6 Units under the skin 3 (three) times a day after meals. Replaces: insulin  lispro 100 unit/mL KwikPen    * insulin  lispro 100 unit/mL injection Commonly known as: HumaLOG  Inject 0-6 Units under the skin 4 (four) times a day after meals and at bedtime. 0-6 Units, subcutaneous, 4 times daily after meals and at bedtime. BG Less Than 70: Follow hypoglycemia instructions. BG 70 - 179: No dose needed. BG 180 - 200: 0 units. BG 201 - 250: 2 units. BG 251 - 300: 3 units. BG 301 - 350: 4 units. BG 351 - 400: 6 units. BG Greater Than 400: Call Provider. Instructions: If previous dose of lispro given within 4 hours, please do not give. Please ensure doses of lispro are given at least 4 hours apart. Page on call provider if there is confusion. Use within 28 days after opening    magnesium  oxide 400 mg (241 mg magnesium ) Tab Take 1 tablet (  400 mg total) by mouth daily.    nicotine  14 mg/24 hr patch Commonly known as: NICODERM CQ  Place 1 patch on the skin daily.    polysaccharide iron complex 150 mg iron capsule Commonly known as: NU-IRON Take 1 capsule (150 mg total) by mouth daily.    * QUEtiapine 25 mg tablet Commonly known as: SEROquel Take 1 tablet (25 mg total) by mouth daily.    * QUEtiapine 25 mg tablet Commonly known as: SEROquel Take 3 tablets (75 mg total) by mouth at  bedtime.   CHANGE how you take these medications     gabapentin  300 mg capsule Commonly known as: NEURONTIN  Take 1 capsule (300 mg total) by mouth 3 (three) times a day. What changed:  how much to take when to take this    insulin  glargine 100 unit/mL injection Commonly known as: LANTUS  Inject 11 Units under the skin at bedtime. What changed:  how much to take when to take this Another medication with the same name was removed. Continue taking this medication, and follow the directions you see here.    * melatonin 3 mg tablet 3 mg by G-tube route nightly as needed for sleep. What changed: Another medication with the same name was added. Make sure you understand how and when to take each.    * melatonin 3 mg tablet Take 1 tablet (3 mg total) by mouth Every evening. What changed: You were already taking a medication with the same name, and this prescription was added. Make sure you understand how and when to take each.    multivitamin with minerals 1 tablet by G-tube route daily. What changed: how to take this   CONTINUE taking these medications     carvediloL 12.5 mg tablet Commonly known as: COREG Take 1 tablet (12.5 mg total) by mouth in the morning and 1 tablet (12.5 mg total) in the evening. Take with meals.    Dovato  50-300 mg Tab per tablet Generic drug: dolutegravir -lamivudine  Take 1 tablet by mouth daily.    loperamide 2 mg capsule Commonly known as: IMODIUM Take 2 mg by mouth every 8 (eight) hours as needed for diarrhea.    sertraline 50 mg tablet Commonly known as: ZOLOFT 50 mg by G-tube route every morning.    TAB A VITE ORAL 1 tablet by G-tube route every morning.    thiamine  100 mg tablet Commonly known as: VITAMIN B1 100 mg by G-tube route every morning.           STOP taking these medications     acetaminophen  325 mg tablet Commonly known as: TYLENOL     amantadine 100 mg capsule Commonly known as: SYMMETREL    cyclobenzaprine 10 mg  tablet Commonly known as: FLEXERIL    diphenoxylate-atropine 2.5-0.025 mg per tablet Commonly known as: LOMOTIL    dolutegravir  50 mg Tab tablet Commonly known as: TIVICAY     ferrous sulfate 325 mg (65 mg iron) tablet    folic acid  1 mg tablet Commonly known as: FOLVITE     FreeStyle Libre 3 Plus Sensor Generic drug: blood-glucose sensor    hydrocortisone 2.5 % cream    insulin  lispro 100 unit/mL KwikPen Commonly known as: HumaLOG  KwikPen Replaced by: insulin  lispro 100 unit/mL injection    ketoconazole 2 % cream Commonly known as: NIZORAL    lamiVUDine  300 mg tablet Commonly known as: EPIVIR     lancets 33 gauge Misc    lansoprazole 30 mg disintegrating tablet Commonly known as: PREVACID SOLUTAB  lisinopriL  20 mg tablet Commonly known as: PRINIVIL     LORazepam  1 mg tablet Commonly known as: ATIVAN     meloxicam 15 mg tablet Commonly known as: MOBIC    NON FORMULARY    NON FORMULARY    NON FORMULARY    OLANZapine 5 mg tablet Commonly known as: ZyPREXA    omeprazole 40 mg DR capsule Commonly known as: PriLOSEC    OneTouch Verio test strips test strip Generic drug: glucose blood    pen needle, diabetic 32 gauge x 5/32 Ndle    tadalafiL 20 mg tablet Commonly known as: CIALIS    traMADoL 50 mg tablet Commonly known as: ULTRAM    triamcinolone acetonide 0.025 % Lotn               Continuous Infusions:  PRN Meds:. Do not use this list as official medication orders. Please verify with discharge summary.  X-ray and Laboratory Findings/Date: See Clinical Information for details     ADDITIONAL INFORMATION:  Discharge Follow-up Action Items: Patient given a supply of his HIV medications to bedside for 1 month.  Patient must follow-up with infectious disease.  (Urgent referral placed) Patient is to follow-up with endocrinology on 10/20 Follow-up with PCP in 1 to 2 weeks If patient improves from a mentation perspective, can wean down  Seroquel Follow up with Neuro outpatient (order placed for ambulatory referral)      Joetta DELENA Molt, RN

## 2024-01-05 NOTE — Nursing Note (Signed)
 Called report to [place] at [facility]. All questions were answered.Did inform MD that patient refused g tube flush and morning insulin .

## 2024-01-07 NOTE — Telephone Encounter (Signed)
 Patient last seen by Dr. Rhett 09/26/2022.  1st call attmempt made to schedule f/u appt. No answer. Generic v/m left requesting a return call. PN team will make a second attempt.  Jennifer L[employer] Eligibility Specialist

## 2024-01-07 NOTE — Telephone Encounter (Signed)
 Referral in the system for HIV infection, unspecified symptom status [Z21]  Message to Pat Nav: Please contact pt to schedule New HIV Consult. Thank you.

## 2024-01-08 NOTE — Telephone Encounter (Signed)
 2nd attempt made; lvm and sent letter to address on file.

## 2024-02-04 NOTE — Telephone Encounter (Signed)
 Patient's wife called to schedule new appt for patient. She states he is currently at [facility]. She will notify them of the appt date and time and they will arrange the transportation.  Jennifer L[employer] Eligibility Specialist

## 2024-02-12 NOTE — Telephone Encounter (Signed)
 Attempted to contact patient's wife for update, with no answer. Generic voice message left for return call to [facility] at [phone], option 3.   Message to ID Team. FYI

## 2024-02-12 NOTE — Telephone Encounter (Signed)
 Patients wife called to reschedule his appointment for today, asking if he can still get a refill on his medication till his next appointment, please advise   Phone: [phone]   Pharmacy: [id number] - [place], [place] - [address] - PHONE: [phone] - FAX: [phone]

## 2024-02-12 NOTE — Telephone Encounter (Signed)
 Return call received from patient's wife, Serina (two pt identifiers used).   Wife verbalized understanding.  No other concerns at this time.   Message to ID Team. FYI

## 2024-02-12 NOTE — Telephone Encounter (Signed)
 Med refilled- last VL undetectable in September

## 2024-02-12 NOTE — Telephone Encounter (Addendum)
 Patients daughter Wava) called requesting to speak to a nurse stated this morning at breakfast patients blood sugar was at 383 stated she gave him lantus  at 9:22 am and just checked his sugar again and now it is at 489 will eat lunch around 12:30 asking if she is needing give him his Humalog , please advise  Reached out to nurse by phone, nurse advised me to transfer call to Dr. Tobie    Phone: [phone]

## 2024-02-12 NOTE — Telephone Encounter (Signed)
 AC rescheduled pt Appt to 02/27/2024.  Please advise about refill request.   Message to ID Team for determination

## 2024-02-12 NOTE — Telephone Encounter (Signed)
 Call returned to Serina. She states that she was able to talk to Dr. Tobie, gave the patient his insulin , and will continue to monitor for his BS to come down. No needs at this time.

## 2024-02-13 NOTE — Telephone Encounter (Signed)
 He does not have a way to get here today. He is currently scheduled with Megan 12/3. Open to other suggestions if you have them.

## 2024-02-15 NOTE — Telephone Encounter (Signed)
 Wife called regarding current blood sugar 288. Please see message below and advise.

## 2024-02-15 NOTE — Telephone Encounter (Addendum)
 Patient's wife, Serina, is calling in stating that patient's blood sugars are running high again. She states last night the patient's sugars were at 230 and she gave him 14 units of Humalog  and it went down to 195. She states this morning he is now currently at 288 and she needs to know if she should give the Lantus  and Humalog  together this morning. Per protocol, escalating to the nurse. Unable to transfer call due to phone issues. Per nurse, they will send this to Provider and call patient's wife back urgently to advise.

## 2024-02-15 NOTE — Telephone Encounter (Signed)
 Patient's wife, Serina, is calling back in regards to missed call. Call was connected with the nurse.

## 2024-02-15 NOTE — Telephone Encounter (Signed)
 I left voicemail advising pt and letting wife know to call us  if questions is needed.   Carl Reed

## 2024-02-20 NOTE — Progress Notes (Signed)
 SUBJECTIVE   Patient ID: Carl Reed is a 58 y.o. (DOB 09-16-1965) male  Chief Complaint  Patient presents with   hospital follow up    08/30 - 10/04   New Patient  He has seen his Endocrinologist twice since discharge. He has a pending appointment with ID next week. He has no pending appointments with Neurology. He is present with his wife and his wife reports he is doing well. Has family that also help with his care. Lives at home with family. Endocrinology is working on getting an insulin  pump and a CGM. Wife reports he is not needing the Lorazepam . He no longer has the G-tube. Eating/drinking well. Mood has been stable for 2 days, prior to that emotional/agitated.  General Medicine [facility] Discharge Summary   Name: Carl Reed MRN: [id number] Age: 38 yrs DOB: 11/16/1965   Admit date: 12/01/2023 Discharge date and time: 01/05/2024   Admitting Physician: Rea Deanie Rickers, MD Discharge Physician: Lauraine Lent, MD   Admission Diagnoses:  Hyperosmolar hyperglycemic state (HHS)    (CMD) [E11.00]    Discharge Diagnoses:  Poorly controlled type 1 diabetes AMS most consistent with delirium   Admission Condition: poor Discharged Condition: fair   Hospital Course:  For full details, please see H&P, progress notes, consult notes and ancillary notes. Briefly, Carl Reed is a 58 y.o. male with PMHx of T1DM c/b DKA, HTN, alcohol dependence, HIV, positive PPD s/p treatment presenting from SNF [facility]) with AMS. Found to be hyperglycemic and hypernatremic on admission. Hospital course will be addressed in a problem based format as below.   #Toxic metabolic encephalopathy #Hypernatremia #Delirium Patient had acute onset of encephalopathy at SNF after recent hospitalization with encephalopathy requiring intubation and Zyprexa due to agitation.  It is suspected that he most likely had hypoglycemic brain injury during that hospitalization.  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  He additionally had multiple psychoactive medications on board, discharged with amantadine and Zyprexa.  He was noted to be hyperglycemic on arrival here as well as hypernatremic which was suspected to be contributing to his encephalopathy this admission.  This hypernatremia was corrected with increasing his free water flushes.  The amantadine and Zyprexa were discontinued as it was unclear what the indication was to start them during last hospitalization.  His mental status improved as his sodium returned to normal.  However, he then had episodes of delirium which required him to be in restraints and have a sitter.  He was started on nightly Seroquel and dose was titrated to 25 mg daily and 75 mg of Seroquel nightly.  He was continued on Warnicke dose thiamine  and folic acid  supplementation during this admission.   With moderate improvement in patient's glucose control and supportive care, patient's mentation did improve.  He is more interactive and conversant than upon admission.  Upon discharge he is alert and oriented by 2 (name and place, not year).   #Hyperglycemia #Episodic hypoglycemia #T1DM #G tube dependent Patient was hyperglycemic on admission at 943, however, had no anion gap or ketosis to suggest DKA.  Also did not appear consistent with HHS.  He was initially put on an insulin  drip and was able to rapidly be switched to subcutaneous insulin .  Endocrinology was consulted given his type 1 diabetes mellitus and difficult to control his blood sugars in the setting of recent initiation of tube feeds.  He had intermittent episodes of hypoglycemia throughout this admission, most often occurring at night.  Patient was able to tolerate p.o. intake.  Tube feeds were discontinued.  Patient will discharge with a basal bolus regimen.  Patient will take Lantus  11 units nightly.  Patient will take prandial lispro 6 units 3 times daily with meals.  Patient  will also have correctional sliding scale insulin .  Endocrine assisted with this regimen.  They noted that with the patient's significant brittle diabetes, 200-400 to be a reasonable goal for glucose levels.   #Possible UTI Patient was noted to have a urinalysis on admission that was somewhat consistent with infection and urine culture with less than 10,000 colony-forming units of an unknown organism.  Given we are unable to assess his symptoms, he was empirically treated with a 5-day course of Rocephin.  He did not report of any other significant dysuria symptoms throughout the rest of his hospitalization.   On day of discharge, patient is clinically stable with no new examination findings or acute symptoms compared to prior.  The patient was seen by the attending physician on the date of discharge and deemed stable and acceptable for discharge.  The patient's chronic medical conditions were treated accordingly per the patient's home medication regimen.  The patient's medication reconciliation, follow-up appointments, discharge orders, instructions, and significant lab and diagnostic studies are as noted.    Medication changes: Insulin  regimen as follows: Insulin  Lantus : 11 units nightly Insulin  lispro: 6 units 3 times daily after meals Insulin  lispro: 0 to 6 units BG 70 - 179: No dose needed  BG 180 - 200: 0  BG 201 - 250: 2  BG 251 - 300: 3 BG 301 - 350: 4   BG 351 - 400: 6  BG Over 400, let provider know The following medications have been discontinued: Amantadine, Ativan , Lomotil, scheduled Tylenol , olanzapine, tramadol, tadalafil Carvedilol was restarted upon discharge   Discharge Follow-up Action Items: Patient given a supply of his HIV medications to bedside for 1 month.  Patient must follow-up with infectious disease.  (Urgent referral placed) Patient is to follow-up with endocrinology on 10/20 Follow-up with PCP in 1 to 2 weeks If patient improves from a mentation perspective, can  wean down Seroquel Follow up with Neuro outpatient (order placed for ambulatory referral) Please note that endocrinology recommends a more lenient blood sugar goal of 200-400 without hypoglycemia or hyperglycemic emergency.     Patient's Ordered Code Status: Full Code   Physical Exam Constitutional: NAD, resting in bed, well-nourished, conversational HEENT: EOMI, PERRL, MMM Neck: Supple and full range of motion Cardiovascular: regular rhythm, normal S1 and S2, no m/r/g, peripheral pulses palpable Respiratory: Lungs clear to auscultation bilaterally. Normal work of breathing on room air  Abdominal: Soft, nondistended, nontender to palpation  Skin: No obvious rashes or lesions. Warm and dry.  MSK: Normal strength and range of motion in all extremities.  No peripheral edema.  Neuro: Alert and oriented x3. Following commands. No focal deficits.  Psych: Appropriate mood and affect    Consults: IP CONSULT TO NUTRITION SERVICES IP CONSULT TO ENDOCRINOLOGY IP CONSULT TO NEUROLOGY IP CONSULT TO PALLIATIVE CARE   Disposition: Skilled nursing facility   Patient Instructions:     Medication List       START taking these medications     * insulin  lispro 100 unit/mL injection Commonly known as: HumaLOG  Inject 6 Units under the skin 3 (three) times a day after meals. Replaces: insulin  lispro 100 unit/mL KwikPen    * insulin  lispro 100 unit/mL injection Commonly known as: HumaLOG  Inject  0-6 Units under the skin 4 (four) times a day after meals and at bedtime. 0-6 Units, subcutaneous, 4 times daily after meals and at bedtime. BG Less Than 70: Follow hypoglycemia instructions. BG 70 - 179: No dose needed. BG 180 - 200: 0 units. BG 201 - 250: 2 units. BG 251 - 300: 3 units. BG 301 - 350: 4 units. BG 351 - 400: 6 units. BG Greater Than 400: Call Provider. Instructions: If previous dose of lispro given within 4 hours, please do not give. Please ensure doses of lispro are given at least 4 hours  apart. Page on call provider if there is confusion. Use within 28 days after opening    magnesium  oxide 400 mg (241 mg magnesium ) Tab Take 1 tablet (400 mg total) by mouth daily.    nicotine  14 mg/24 hr patch Commonly known as: NICODERM CQ  Place 1 patch on the skin daily.    polysaccharide iron complex 150 mg iron capsule Commonly known as: NU-IRON Take 1 capsule (150 mg total) by mouth daily.    * QUEtiapine 25 mg tablet Commonly known as: SEROquel Take 1 tablet (25 mg total) by mouth daily.    * QUEtiapine 25 mg tablet Commonly known as: SEROquel Take 3 tablets (75 mg total) by mouth at bedtime.          * This list has 4 medication(s) that are the same as other medications prescribed for you. Read the directions carefully, and ask your doctor or other care provider to review them with you.              CHANGE how you take these medications     gabapentin  300 mg capsule Commonly known as: NEURONTIN  Take 1 capsule (300 mg total) by mouth 3 (three) times a day. What changed:  how much to take when to take this    insulin  glargine 100 unit/mL injection Commonly known as: LANTUS  Inject 11 Units under the skin at bedtime. What changed:  how much to take when to take this Another medication with the same name was removed. Continue taking this medication, and follow the directions you see here.    * melatonin 3 mg tablet 3 mg by G-tube route nightly as needed for sleep. What changed: Another medication with the same name was added. Make sure you understand how and when to take each.    * melatonin 3 mg tablet Take 1 tablet (3 mg total) by mouth Every evening. What changed: You were already taking a medication with the same name, and this prescription was added. Make sure you understand how and when to take each.    multivitamin with minerals 1 tablet by G-tube route daily. What changed: how to take this          * This list has 2 medication(s) that are the same  as other medications prescribed for you. Read the directions carefully, and ask your doctor or other care provider to review them with you.              CONTINUE taking these medications     carvediloL 12.5 mg tablet Commonly known as: COREG Take 1 tablet (12.5 mg total) by mouth in the morning and 1 tablet (12.5 mg total) in the evening. Take with meals.    Dovato  50-300 mg Tab per tablet Generic drug: dolutegravir -lamivudine  Take 1 tablet by mouth daily.    loperamide 2 mg capsule Commonly known as: IMODIUM Take 2 mg by mouth  every 8 (eight) hours as needed for diarrhea.    sertraline 50 mg tablet Commonly known as: ZOLOFT 50 mg by G-tube route every morning.    TAB A VITE ORAL 1 tablet by G-tube route every morning.    thiamine  100 mg tablet Commonly known as: VITAMIN B1 100 mg by G-tube route every morning.           STOP taking these medications     acetaminophen  325 mg tablet Commonly known as: TYLENOL     amantadine 100 mg capsule Commonly known as: SYMMETREL    cyclobenzaprine 10 mg tablet Commonly known as: FLEXERIL    diphenoxylate-atropine 2.5-0.025 mg per tablet Commonly known as: LOMOTIL    dolutegravir  50 mg Tab tablet Commonly known as: TIVICAY     ferrous sulfate 325 mg (65 mg iron) tablet    folic acid  1 mg tablet Commonly known as: FOLVITE     FreeStyle Libre 3 Plus Sensor Generic drug: blood-glucose sensor    hydrocortisone 2.5 % cream    insulin  lispro 100 unit/mL KwikPen Commonly known as: HumaLOG  KwikPen Replaced by: insulin  lispro 100 unit/mL injection    ketoconazole 2 % cream Commonly known as: NIZORAL    lamiVUDine  300 mg tablet Commonly known as: EPIVIR     lancets 33 gauge Misc    lansoprazole 30 mg disintegrating tablet Commonly known as: PREVACID SOLUTAB    lisinopriL  20 mg tablet Commonly known as: PRINIVIL     LORazepam  1 mg tablet Commonly known as: ATIVAN     meloxicam 15 mg tablet Commonly known as:  MOBIC    NON FORMULARY    NON FORMULARY    NON FORMULARY    OLANZapine 5 mg tablet Commonly known as: ZyPREXA    omeprazole 40 mg DR capsule Commonly known as: PriLOSEC    OneTouch Verio test strips test strip Generic drug: glucose blood    pen needle, diabetic 32 gauge x 5/32 Ndle    tadalafiL 20 mg tablet Commonly known as: CIALIS    traMADoL 50 mg tablet Commonly known as: ULTRAM    triamcinolone acetonide 0.025 % Lotn              Where to Get Your Medications       These medications were sent to [facility] [address] [place] [address]      Hours: Open Monday 12am to Friday 11:59pm; Sat-Sun: Closed; Holidays: Closed Thanksgiving Phone: [phone]  Dovato  50-300 mg Tab per tablet      OBJECTIVE   Allergies[1]  Current Medications[2]  Problem List[3]  Medical History[4]    Surgical History[5]  Family History[6]  BP 112/66 (BP Location: Left arm, Patient Position: Sitting)   Wt 58.1 kg (128 lb)   BMI 17.85 kg/m   Review of Systems Systemic: Feeling fine.  No fever  and no chills. Head: No headache. Eyes: No vision problems. Cardiovascular: No chest pain or discomfort. Pulmonary: No dyspnea. Gastrointestinal: No abdominal pain. Psychological: No depression.   Constitutional: Oriented to person, place and time. Appears well-developed and well nourished. Cardiovascular: Normal rate and rhythm.  Exam reveals no gallop and no friction rub.  No murmur heard.  Pulmonary/chest: Effort normal and breath sounds normal. No wheezes. No rales.  Smiles, speaks in 1 word phrases when asked questions. Calm. In wheelchair.   ASSESSMENT/PLAN   1. Toxic metabolic encephalopathy  Ambulatory referral to Neurology   Basic Metabolic Panel    2. Hospital discharge follow-up  Ambulatory referral  to Neurology    3. Hyperosmolar hyperglycemic state (HHS)    (CMD)        Patient Instructions  Urgent referral to  Neurology Recheck BMP today Restart Vitamin B1 100 mg daily Stop Lorazepam  Refer to chronic complex care clinic for ongoing primary care management/complexity of care.  Return if symptoms worsen or fail to improve.  Orders Placed This Encounter  Procedures   Basic Metabolic Panel   Ambulatory referral to Neurology    Requested Prescriptions    No prescriptions requested or ordered in this encounter    Risks, benefits, and alternatives of the medication(s) and treatment plan(s) were discussed, and he expressed understanding. Plan follow up as discussed or as needed. No barriers to treatment identified in this visit.            [1] No Known Allergies [2] Current Outpatient Medications  Medication Sig Dispense Refill   carvediloL (COREG) 6.25 mg tablet Take 1 tablet (6.25 mg total) by mouth in the morning and 1 tablet (6.25 mg total) in the evening. Take with meals. 180 tablet 3   dolutegravir -lamivudine  (Dovato ) 50-300 mg tab per tablet Take 1 tablet by mouth daily. 30 tablet 1   gabapentin  (NEURONTIN ) 300 mg capsule Take 1 capsule (300 mg total) by mouth 3 (three) times a day.     glucagon  (Baqsimi ) 3 mg/actuation nasal spray Use 1 spray in 1 nostril once as directed for severe low blood sugar (hypoglycemia). 1 each 3   haloperidol (HALDOL) 2 mg/mL oral CONCENTRATE solution Take 1 mL (2 mg total) by mouth 2 (two) times a day. 120 mL 0   insulin  glargine (LANTUS  SoloStar) 100 unit/mL (3 mL) pen Inject 25 Units under the skin daily. 30 mL 2   insulin  lispro (HumaLOG  KwikPen) 100 unit/mL KwikPen Inject 8 Units under the skin 3 (three) times a day before meals. 30 mL 3   insulin  lispro (HumaLOG ) 100 unit/mL injection Inject 6 Units under the skin 3 (three) times a day after meals.     insulin  lispro (HumaLOG ) 100 unit/mL injection Inject 0-6 Units under the skin 4 (four) times a day after meals and at bedtime. 0-6 Units, subcutaneous, 4 times daily after meals and at  bedtime. BG Less Than 70: Follow hypoglycemia instructions. BG 70 - 179: No dose needed. BG 180 - 200: 0 units. BG 201 - 250: 2 units. BG 251 - 300: 3 units. BG 301 - 350: 4 units. BG 351 - 400: 6 units. BG Greater Than 400: Call Provider. Instructions: If previous dose of lispro given within 4 hours, please do not give. Please ensure doses of lispro are given at least 4 hours apart. Page on call provider if there is confusion. Use within 28 days after opening     loperamide (IMODIUM) 2 mg capsule Take 2 mg by mouth every 8 (eight) hours as needed for diarrhea.     LORazepam  (ATIVAN ) 0.5 mg tablet Take 1 tablet (0.5 mg total) by mouth every 6 (six) hours as needed for anxiety. 30 tablet 0   magnesium  oxide 400 mg (241 mg magnesium ) tab Take 1 tablet (400 mg total) by mouth daily.     melatonin 3 mg tablet 3 mg by G-tube route nightly as needed for sleep.     melatonin 3 mg tablet Take 1 tablet (3 mg total) by mouth Every evening.     multivit with calcium ,iron,min (TAB A VITE ORAL) 1 tablet by G-tube route every morning.  multivit,tx with iron,minerals (multivitamin with minerals) 1 tablet by G-tube route daily.     pen needle, diabetic (BD Nano 2nd Gen Pen Needle) 32 gauge x 5/32 ndle Use 4 times daily 400 each 3   polysaccharide iron complex (NU-IRON) 150 mg iron capsule Take 1 capsule (150 mg total) by mouth daily.     QUEtiapine (SEROquel) 25 mg tablet Take 1 tablet (25 mg total) by mouth daily.     QUEtiapine (SEROquel) 25 mg tablet Take 3 tablets (75 mg total) by mouth at bedtime.     sertraline (ZOLOFT) 50 mg tablet Take 1 tablet (50 mg total) by mouth every morning. 90 tablet 1   thiamine  (VITAMIN B1) 100 mg tablet 100 mg by G-tube route every morning.     No current facility-administered medications for this visit.  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)   Other male erectile dysfunction   Closed displaced fracture of proximal phalanx of left little finger with routine healing   GERD (gastroesophageal reflux disease)   Thyroid nodule   DKA (diabetic ketoacidosis)   Avascular necrosis of bone of hip, left   Hypomagnesemia   Closed fracture of fifth metatarsal bone   Weight loss   Neuropathy   Essential hypertension   Metabolic encephalopathy   Dysphagia   Type 1 diabetes mellitus with hypoglycemia   Macrocytic anemia   IDA (iron deficiency anemia)   CAP (community acquired pneumonia)  [4] Past Medical History: Diagnosis Date   Alcoholism    (CMD) 06/18/2015   Contact dermatitis and other eczema, due to unspecified cause    Dermatophytosis of foot    Diabetes mellitus type II, controlled    DKA (diabetic ketoacidosis) 03/27/2020   Human immunodeficiency virus (HIV) disease    (CMD) 1996   CD4 nadir 298 (10/00)   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 [6] Family History Problem Relation Name Age of Onset   Hypertension Other family    Cancer Other family        aunt with lung cancer   Diabetes Other family    Kidney disease Mother

## 2024-02-27 NOTE — Progress Notes (Signed)
------------------------------------------------------------------------------- °  Attestation signed by Elizbeth Blanch, MD at 02/27/2024  6:01 PM I have reviewed Carl Reed's note and I agree with the findings and plan of care. Elizbeth Blanch, MD  -------------------------------------------------------------------------------  Encounter Diagnosis  Name Primary?   Type 1 diabetes mellitus with microalbuminuria    (CMD) Yes    iLet Beta Bionic Pump Start: Time in:  1330 Time out:  1630  Subjective: Carl Reed is here today to start his/her iLet Beta Bionic insulin  pump. Carl Reed is currently using [facility]I to manage their Type T1D DM.  Patinet was training today by Carl Reed RD [employer] Ilet Beta Bionic clinical trainer, this [employer] observed training.  No charge for today's visit since patient trained by industry representative.  Learning needs include: iLet Beta Bionic insulin  pump start   Monitoring: Carl Reed is currently using the Dexcom G7 CGM system. Lab Results  Component Value Date   HGBA1C 9.5 (H) 09/26/2022   POCA1C 9.5 (A) 02/06/2024     Medications:  Basal:  Tresiba 22 units Bolus:  humalog   Current weight: Wt Readings from Last 3 Encounters:  02/27/24 60 kg (132 lb 3.2 oz)  02/20/24 58.1 kg (128 lb)  02/11/24 58.7 kg (129 lb 6.4 oz)  Weight from 02/27/24 entered into pump for setting calculations.   Education: - Be patient - Take care of your iLet - Always respond to alerts - Changing infusion set - Announce meals: Right before you start eating  - Usual: Chosen most of time  - More: ~50% more than usual  - Less: ~ 50% less than usual - Select meal type:  - Breakfast  - Lunch  - Dinner  - Announce a snack if it has about the same amount of carbs for one of the usual meals - When to disconnect:  - All water related activities  - Up to 1 hour before exercise/as recommended by provider  - If disconnecting, remember to reconnect after finishing activity -  Always have extra supplies  - Back up insulin  therapy  - BG meter and supplies  - Ketone strips  - Rapid acting carbohydrates/Rescue glucagon   - CGM supplies  - Insulin  cartridge and supplies; infusion sets/tubing  - Engineer, Maintenance  - Emergency contacts - BG-run Mode  - Have back up plan  - iLet will go into BG-run mode if it is not receiving CGM glucose readings   - See table on handout - Sync with iLet mobile app - Contact information for support  Resources: - Congratulations: on Going Bionic - Insulin  pump back up plan - Insulin  pump troubleshooting  Ilet log in: [email or web address]  Dexcom log in: [email or web address]  Plan of Care: - Greco will contact educator tomorrow  - Josephanthony will monitor glucose closely - Gunner agrees to announce meals before eating  Follow Up:  Beta rep to follow up with patient in two days, will schedule follow up with patient in 2-3 weeks.

## 2024-02-27 NOTE — Progress Notes (Signed)
 Case Management Update  Date: 02/27/2024   Time: 2:00 PM   Patient Type: Clinics  Pt is 58-yo male screened for SW needs in ID Clinic today. Pt was accompanied by his wife Carl Reed. Pt is HIV-infected, has DM1. Pt was hospitalized 07/14-26/25 w/acute respiratory failure &sepsis, had hypoglycemic brain injury during stay. Pt was hospitalized 12/01/23-01/05/24 for hyperosmolar hyperglycemic state, poorly controlled DM1, & AMS. Pt was in wheel chair, was able to cooperate w/getting arm out of sleeve for injection & feet on wheel chair foot rest. Pt is going to get insulin  pump following this appt. Pt's wife & family provide care. Wife had questions about getting Medicaid. Pt has LT disability benefit thru his job & still has insurance from work, so it appears he would not qualify for Medicaid. Wife had questions about home health. SW discussed difference between home health & PCS. No other SW issues at this time. Plan: Case closed.  Carl Reed, MSW

## 2024-03-07 NOTE — Progress Notes (Signed)
 [facility] Population Health post TCM follow up  Date of call: 03/07/24  Discharged from: [facility] and rehab  Updates/Changes since last encounter: Spoke to pts wife he reports he is doing ok.  Reports he is just getting up and getting ready to give him breakfast.  Reports she feels his confusion has improved. Reports he is walking some. Denies any pain, SOB, Reports his blood sugar today was 145.Denies any issues with appetite, bowel or bladder or any concerns at this time. Reports has PCP and specialist numbers if needed     Current Questions/Concerns: none  Is patient candidate for Navigation: no  Electronically signed by: Jon Quin Axe, RN 03/07/2024 9:19 AM

## 2024-03-12 ENCOUNTER — Encounter (HOSPITAL_COMMUNITY): Payer: Self-pay

## 2024-03-12 ENCOUNTER — Inpatient Hospital Stay (HOSPITAL_COMMUNITY)
Admission: EM | Admit: 2024-03-12 | Discharge: 2024-03-25 | DRG: 637 | Disposition: A | Discharge status: Home / Self Care | Attending: Internal Medicine | Admitting: Internal Medicine

## 2024-03-12 ENCOUNTER — Other Ambulatory Visit: Payer: Self-pay

## 2024-03-12 DIAGNOSIS — E1065 Type 1 diabetes mellitus with hyperglycemia: Principal | ICD-10-CM | POA: Diagnosis present

## 2024-03-12 DIAGNOSIS — Z681 Body mass index (BMI) 19 or less, adult: Secondary | ICD-10-CM

## 2024-03-12 DIAGNOSIS — I959 Hypotension, unspecified: Secondary | ICD-10-CM | POA: Diagnosis not present

## 2024-03-12 DIAGNOSIS — F39 Unspecified mood [affective] disorder: Secondary | ICD-10-CM | POA: Diagnosis present

## 2024-03-12 DIAGNOSIS — E1165 Type 2 diabetes mellitus with hyperglycemia: Secondary | ICD-10-CM

## 2024-03-12 DIAGNOSIS — Z1152 Encounter for screening for COVID-19: Secondary | ICD-10-CM

## 2024-03-12 DIAGNOSIS — R739 Hyperglycemia, unspecified: Secondary | ICD-10-CM

## 2024-03-12 DIAGNOSIS — F17210 Nicotine dependence, cigarettes, uncomplicated: Secondary | ICD-10-CM | POA: Diagnosis present

## 2024-03-12 DIAGNOSIS — Z21 Asymptomatic human immunodeficiency virus [HIV] infection status: Secondary | ICD-10-CM | POA: Diagnosis present

## 2024-03-12 DIAGNOSIS — D638 Anemia in other chronic diseases classified elsewhere: Secondary | ICD-10-CM | POA: Diagnosis present

## 2024-03-12 DIAGNOSIS — E875 Hyperkalemia: Secondary | ICD-10-CM

## 2024-03-12 DIAGNOSIS — N179 Acute kidney failure, unspecified: Principal | ICD-10-CM

## 2024-03-12 DIAGNOSIS — Z794 Long term (current) use of insulin: Secondary | ICD-10-CM

## 2024-03-12 DIAGNOSIS — Z781 Physical restraint status: Secondary | ICD-10-CM

## 2024-03-12 DIAGNOSIS — E10649 Type 1 diabetes mellitus with hypoglycemia without coma: Secondary | ICD-10-CM | POA: Diagnosis not present

## 2024-03-12 DIAGNOSIS — Z79899 Other long term (current) drug therapy: Secondary | ICD-10-CM

## 2024-03-12 DIAGNOSIS — E1040 Type 1 diabetes mellitus with diabetic neuropathy, unspecified: Secondary | ICD-10-CM | POA: Diagnosis present

## 2024-03-12 DIAGNOSIS — R636 Underweight: Secondary | ICD-10-CM | POA: Diagnosis present

## 2024-03-12 DIAGNOSIS — Z9641 Presence of insulin pump (external) (internal): Secondary | ICD-10-CM | POA: Diagnosis present

## 2024-03-12 DIAGNOSIS — I10 Essential (primary) hypertension: Secondary | ICD-10-CM | POA: Diagnosis present

## 2024-03-12 DIAGNOSIS — G9341 Metabolic encephalopathy: Secondary | ICD-10-CM | POA: Diagnosis present

## 2024-03-12 DIAGNOSIS — K219 Gastro-esophageal reflux disease without esophagitis: Secondary | ICD-10-CM | POA: Diagnosis present

## 2024-03-12 DIAGNOSIS — E860 Dehydration: Secondary | ICD-10-CM | POA: Diagnosis present

## 2024-03-12 LAB — CBC
HCT: 29.6 % — ABNORMAL LOW (ref 39.0–52.0)
Hemoglobin: 9.5 g/dL — ABNORMAL LOW (ref 13.0–17.0)
MCH: 30.5 pg (ref 26.0–34.0)
MCHC: 32.1 g/dL (ref 30.0–36.0)
MCV: 95.2 fL (ref 80.0–100.0)
Platelets: 257 K/uL (ref 150–400)
RBC: 3.11 MIL/uL — ABNORMAL LOW (ref 4.22–5.81)
RDW: 13.7 % (ref 11.5–15.5)
WBC: 6.3 K/uL (ref 4.0–10.5)
nRBC: 0 % (ref 0.0–0.2)

## 2024-03-12 LAB — CBG MONITORING, ED: Glucose-Capillary: 436 mg/dL — ABNORMAL HIGH (ref 70–99)

## 2024-03-12 LAB — I-STAT CHEM 8, ED
BUN: 28 mg/dL — ABNORMAL HIGH (ref 6–20)
Calcium, Ion: 1.2 mmol/L (ref 1.15–1.40)
Chloride: 102 mmol/L (ref 98–111)
Creatinine, Ser: 1.6 mg/dL — ABNORMAL HIGH (ref 0.61–1.24)
Glucose, Bld: 472 mg/dL — ABNORMAL HIGH (ref 70–99)
HCT: 31 % — ABNORMAL LOW (ref 39.0–52.0)
Hemoglobin: 10.5 g/dL — ABNORMAL LOW (ref 13.0–17.0)
Potassium: 5.9 mmol/L — ABNORMAL HIGH (ref 3.5–5.1)
Sodium: 135 mmol/L (ref 135–145)
TCO2: 23 mmol/L (ref 22–32)

## 2024-03-12 NOTE — Progress Notes (Signed)
 SUBJECTIVE   Patient ID: Carl Reed is a 58 y.o. (DOB 1965/10/17) male  Chief Complaint  Patient presents with   disability paperwork   Location Information: Patient State (at time of visit): [place]  Patient Location (at time of visit):Home/Other Non-Medical  Provider Location: Non-Provider-Based Clinic (Clinic, non-hospital) Is provider licensed to provide clinical care in the current location/state of the patient? Yes   Consent:  Patient's identity was confirmed. Presenting condition or illness was discussed with the patient/personal representative. Current proposed treatment for presenting condition or illness was explained to patient/personal representative along with the likely benefits and any significant risks or complications associated with the provision of treatment by audio/video means. The patient/personal representative verbally authorized treatment to be provided by audio/video, which may include a limited review of patient's current health status, medication, or other treatment recommendations, patient education, and an opportunity to ask questions about condition and treatment. Verbal Consent Granted by Patient/Personal Representative:Yes   Visit Information: Modality: 2-Way Real-Time Audio/Video  Video Start Time: 10:05 AM Video Stop Time: 10:48 AM Video Total Time: 43 minutes  He was seen last month as a New Patient for hospital follow up for hyperosmolar hyperglycemic state/Toxic metabolic encephalopathy. He has Type 1 diabetes and is managed by Endocrinology. He has HIV and is managed by ID. He has a pending appointment with Neurology in March. We attempted to get him into the chronic complex care clinic and he was denied. He is present with his wife today and report they need disability forms completed for work.  OBJECTIVE   Allergies[1]  Current Medications[2]  Problem List[3]  Medical History[4]    Surgical History[5]  Family  History[6]  There were no vitals taken for this visit.  Review of Systems Systemic: Feeling fine.  No fever  and no chills. Head: No headache. Eyes: No vision problems. Cardiovascular: No chest pain or discomfort. Pulmonary: No dyspnea. Gastrointestinal: No abdominal pain. Psychological: No depression.    ASSESSMENT/PLAN   1. Hyperosmolar hyperglycemic state (HHS)    (CMD)      2. Diabetic polyneuropathy associated with type 1 diabetes mellitus    (CMD)      3. Metabolic encephalopathy      4. HIV infection, unspecified symptom status (CMD)        Patient Instructions  Disability forms were completed until he sees Neurology. I discussed with the wife transferring his care to a physician and she agrees.  Return if symptoms worsen or fail to improve.  No orders of the defined types were placed in this encounter.   Requested Prescriptions   Signed Prescriptions Disp Refills   gabapentin  (NEURONTIN ) 300 mg capsule 270 capsule 0    Sig: Take 1 capsule (300 mg total) by mouth 3 (three) times a day.    Risks, benefits, and alternatives of the medication(s) and treatment plan(s) were discussed, and he expressed understanding. Plan follow up as discussed or as needed. No barriers to treatment identified in this visit.            [1] No Known Allergies [2] Current Outpatient Medications  Medication Sig Dispense Refill   blood-glucose sensor (Dexcom G7 Sensor) Use 1 sensor every 10 days. 9 each 3   carvediloL (COREG) 6.25 mg tablet Take 1 tablet (6.25 mg total) by mouth in the morning and 1 tablet (6.25 mg total) in the evening. Take with meals. 180 tablet 3   dolutegravir -lamivudine  (Dovato ) 50-300 mg tab per tablet Take 1 tablet by mouth daily.  30 tablet 5   gabapentin  (NEURONTIN ) 300 mg capsule Take 1 capsule (300 mg total) by mouth 3 (three) times a day. 270 capsule 0   glucagon  (Baqsimi ) 3 mg/actuation nasal spray Use 1 spray in 1 nostril once as directed  for severe low blood sugar (hypoglycemia). 1 each 3   haloperidol (HALDOL) 2 mg/mL oral CONCENTRATE solution Take 1 mL (2 mg total) by mouth 2 (two) times a day. 120 mL 0   insulin  glargine (LANTUS  SoloStar) 100 unit/mL (3 mL) pen Inject 25 Units under the skin daily. 30 mL 2   insulin  lispro (HumaLOG  KwikPen) 100 unit/mL KwikPen Inject 8 Units under the skin 3 (three) times a day before meals. 30 mL 3   insulin  lispro (HumaLOG  U-100 Insulin ) 100 unit/mL injection Use up to 50 units daily via insulin  pump. 20 mL 3   insulin  lispro (HumaLOG ) 100 unit/mL injection Inject 6 Units under the skin 3 (three) times a day after meals.     insulin  lispro (HumaLOG ) 100 unit/mL injection Inject 0-6 Units under the skin 4 (four) times a day after meals and at bedtime. 0-6 Units, subcutaneous, 4 times daily after meals and at bedtime. BG Less Than 70: Follow hypoglycemia instructions. BG 70 - 179: No dose needed. BG 180 - 200: 0 units. BG 201 - 250: 2 units. BG 251 - 300: 3 units. BG 301 - 350: 4 units. BG 351 - 400: 6 units. BG Greater Than 400: Call Provider. Instructions: If previous dose of lispro given within 4 hours, please do not give. Please ensure doses of lispro are given at least 4 hours apart. Page on call provider if there is confusion. Use within 28 days after opening     LORazepam  (ATIVAN ) 0.5 mg tablet Take 1 tablet (0.5 mg total) by mouth every 6 (six) hours as needed for anxiety. 30 tablet 0   magnesium  oxide 400 mg (241 mg magnesium ) tab Take 1 tablet (400 mg total) by mouth daily.     melatonin 3 mg tablet Take 1 tablet (3 mg total) by mouth Every evening.     multivit,tx with iron,minerals (multivitamin with minerals) 1 tablet by G-tube route daily.     pen needle, diabetic (BD Nano 2nd Gen Pen Needle) 32 gauge x 5/32 ndle Use 4 times daily 400 each 3   QUEtiapine (SEROquel) 25 mg tablet Take 1 tablet (25 mg total) by mouth daily.     QUEtiapine (SEROquel) 25 mg tablet Take 3  tablets (75 mg total) by mouth at bedtime.     sertraline (ZOLOFT) 50 mg tablet Take 1 tablet (50 mg total) by mouth every morning. 90 tablet 1   thiamine  (VITAMIN B1) 100 mg tablet 100 mg by G-tube route every morning.     No current facility-administered medications for this visit.  [3] Patient Active Problem List Diagnosis   HIV infection    (CMD)   Tobacco use disorder   Dermatitis   DM2 (diabetes mellitus, type 2)   Hypertension associated with type 1 diabetes mellitus    (CMD)   Diabetic polyneuropathy associated with type 1 diabetes mellitus    (CMD)   Alcoholism    (CMD)   Osteopenia of lumbar spine   Left hip pain   Type 1 diabetes mellitus with microalbuminuria    (CMD)   Other male erectile dysfunction   Closed displaced fracture of proximal phalanx of left little finger with routine healing   GERD (gastroesophageal reflux disease)  Thyroid nodule   DKA (diabetic ketoacidosis) (CMD)   Avascular necrosis of bone of hip, left (CMD)   Hypomagnesemia   Closed fracture of fifth metatarsal bone   Weight loss   Neuropathy   Essential hypertension   Metabolic encephalopathy   Dysphagia   Type 1 diabetes mellitus with hypoglycemia (CMD)   Macrocytic anemia   IDA (iron deficiency anemia)   CAP (community acquired pneumonia)   Hyperosmolar hyperglycemic state (HHS)    (CMD)  [4] Past Medical History: Diagnosis Date   Alcoholism    (CMD) 06/18/2015   Contact dermatitis and other eczema, due to unspecified cause    Dermatophytosis of foot    Diabetes mellitus type II, controlled (CMD)    DKA (diabetic ketoacidosis) (CMD) 03/27/2020   Human immunodeficiency virus (HIV) disease    (CMD) 1996   CD4 nadir 298 (10/00)   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 [6] Family History Problem Relation Name Age of Onset   Hypertension Other family    Cancer Other family        aunt with lung cancer   Diabetes Other family    Kidney disease Mother

## 2024-03-12 NOTE — ED Triage Notes (Signed)
 Pt BIBA from home, c/o hyperglycemia.  Dexcom reader read high since 6 pm. Pts wife changed it twice and called customer service and they stated it is working. Denies pain. Hx of AMS at baseline. A&Ox2 to person and situation. Ambulatory with 1 person assist.  18G L FA  500 NS  4mg  zofran    BP 136/62 HR 90 RR 12 O2 100 RA CBG 483

## 2024-03-13 ENCOUNTER — Inpatient Hospital Stay (HOSPITAL_COMMUNITY)

## 2024-03-13 DIAGNOSIS — E875 Hyperkalemia: Secondary | ICD-10-CM | POA: Diagnosis present

## 2024-03-13 DIAGNOSIS — Z1152 Encounter for screening for COVID-19: Secondary | ICD-10-CM | POA: Diagnosis not present

## 2024-03-13 DIAGNOSIS — E1065 Type 1 diabetes mellitus with hyperglycemia: Secondary | ICD-10-CM | POA: Diagnosis present

## 2024-03-13 DIAGNOSIS — R4182 Altered mental status, unspecified: Secondary | ICD-10-CM | POA: Diagnosis present

## 2024-03-13 DIAGNOSIS — I959 Hypotension, unspecified: Secondary | ICD-10-CM | POA: Diagnosis not present

## 2024-03-13 DIAGNOSIS — D638 Anemia in other chronic diseases classified elsewhere: Secondary | ICD-10-CM | POA: Diagnosis present

## 2024-03-13 DIAGNOSIS — G9341 Metabolic encephalopathy: Secondary | ICD-10-CM | POA: Diagnosis present

## 2024-03-13 DIAGNOSIS — Z681 Body mass index (BMI) 19 or less, adult: Secondary | ICD-10-CM | POA: Diagnosis not present

## 2024-03-13 DIAGNOSIS — E860 Dehydration: Secondary | ICD-10-CM | POA: Diagnosis present

## 2024-03-13 DIAGNOSIS — R410 Disorientation, unspecified: Secondary | ICD-10-CM | POA: Diagnosis not present

## 2024-03-13 DIAGNOSIS — R636 Underweight: Secondary | ICD-10-CM | POA: Diagnosis present

## 2024-03-13 DIAGNOSIS — F17210 Nicotine dependence, cigarettes, uncomplicated: Secondary | ICD-10-CM | POA: Diagnosis present

## 2024-03-13 DIAGNOSIS — R739 Hyperglycemia, unspecified: Secondary | ICD-10-CM | POA: Diagnosis not present

## 2024-03-13 DIAGNOSIS — E1040 Type 1 diabetes mellitus with diabetic neuropathy, unspecified: Secondary | ICD-10-CM | POA: Diagnosis present

## 2024-03-13 DIAGNOSIS — E10649 Type 1 diabetes mellitus with hypoglycemia without coma: Secondary | ICD-10-CM | POA: Diagnosis not present

## 2024-03-13 DIAGNOSIS — F39 Unspecified mood [affective] disorder: Secondary | ICD-10-CM | POA: Diagnosis present

## 2024-03-13 DIAGNOSIS — Z79899 Other long term (current) drug therapy: Secondary | ICD-10-CM | POA: Diagnosis not present

## 2024-03-13 DIAGNOSIS — I10 Essential (primary) hypertension: Secondary | ICD-10-CM | POA: Diagnosis present

## 2024-03-13 DIAGNOSIS — Z9641 Presence of insulin pump (external) (internal): Secondary | ICD-10-CM | POA: Diagnosis present

## 2024-03-13 DIAGNOSIS — Z794 Long term (current) use of insulin: Secondary | ICD-10-CM | POA: Diagnosis not present

## 2024-03-13 DIAGNOSIS — Z21 Asymptomatic human immunodeficiency virus [HIV] infection status: Secondary | ICD-10-CM | POA: Diagnosis present

## 2024-03-13 DIAGNOSIS — K219 Gastro-esophageal reflux disease without esophagitis: Secondary | ICD-10-CM | POA: Diagnosis present

## 2024-03-13 DIAGNOSIS — Z781 Physical restraint status: Secondary | ICD-10-CM | POA: Diagnosis not present

## 2024-03-13 DIAGNOSIS — G9340 Encephalopathy, unspecified: Secondary | ICD-10-CM | POA: Diagnosis not present

## 2024-03-13 DIAGNOSIS — E1165 Type 2 diabetes mellitus with hyperglycemia: Secondary | ICD-10-CM | POA: Diagnosis not present

## 2024-03-13 DIAGNOSIS — N179 Acute kidney failure, unspecified: Secondary | ICD-10-CM | POA: Diagnosis present

## 2024-03-13 LAB — GLUCOSE, CAPILLARY
Glucose-Capillary: 152 mg/dL — ABNORMAL HIGH (ref 70–99)
Glucose-Capillary: 196 mg/dL — ABNORMAL HIGH (ref 70–99)
Glucose-Capillary: 124 mg/dL — ABNORMAL HIGH (ref 70–99)
Glucose-Capillary: 129 mg/dL — ABNORMAL HIGH (ref 70–99)
Glucose-Capillary: 212 mg/dL — ABNORMAL HIGH (ref 70–99)
Glucose-Capillary: 263 mg/dL — ABNORMAL HIGH (ref 70–99)
Glucose-Capillary: 116 mg/dL — ABNORMAL HIGH (ref 70–99)
Glucose-Capillary: 85 mg/dL (ref 70–99)

## 2024-03-13 LAB — BLOOD GAS, VENOUS
Acid-base deficit: 5.9 mmol/L — ABNORMAL HIGH (ref 0.0–2.0)
Bicarbonate: 19.5 mmol/L — ABNORMAL LOW (ref 20.0–28.0)
Drawn by: 70290
O2 Saturation: 93.2 %
Patient temperature: 37
pCO2, Ven: 37 mmHg — ABNORMAL LOW (ref 44–60)
pH, Ven: 7.33 (ref 7.25–7.43)
pO2, Ven: 60 mmHg — ABNORMAL HIGH (ref 32–45)

## 2024-03-13 LAB — URINALYSIS, ROUTINE W REFLEX MICROSCOPIC
Bacteria, UA: NONE SEEN
Bilirubin Urine: NEGATIVE
Glucose, UA: >=500 mg/dL — AB
Hgb urine dipstick: NEGATIVE
Ketones, ur: 5 mg/dL — AB
Leukocytes,Ua: NEGATIVE
Nitrite: NEGATIVE
Protein, ur: NEGATIVE mg/dL
Specific Gravity, Urine: 1.012 (ref 1.005–1.030)
pH: 6 (ref 5.0–8.0)

## 2024-03-13 LAB — RESP PANEL BY RT-PCR (RSV, FLU A&B, COVID)  RVPGX2
Influenza A by PCR: NEGATIVE
Influenza B by PCR: NEGATIVE
Resp Syncytial Virus by PCR: NEGATIVE
SARS Coronavirus 2 by RT PCR: NEGATIVE

## 2024-03-13 LAB — COMPREHENSIVE METABOLIC PANEL WITH GFR
ALT: 42 U/L (ref 0–44)
AST: 40 U/L (ref 15–41)
Albumin: 3.6 g/dL (ref 3.5–5.0)
Alkaline Phosphatase: 195 U/L — ABNORMAL HIGH (ref 38–126)
Anion gap: 10 (ref 5–15)
BUN: 27 mg/dL — ABNORMAL HIGH (ref 6–20)
CO2: 23 mmol/L (ref 22–32)
Calcium: 9.5 mg/dL (ref 8.9–10.3)
Chloride: 100 mmol/L (ref 98–111)
Creatinine, Ser: 1.44 mg/dL — ABNORMAL HIGH (ref 0.61–1.24)
GFR, Estimated: 57 mL/min — ABNORMAL LOW (ref >60)
Glucose, Bld: 471 mg/dL — ABNORMAL HIGH (ref 70–99)
Potassium: 6 mmol/L — ABNORMAL HIGH (ref 3.5–5.1)
Sodium: 133 mmol/L — ABNORMAL LOW (ref 135–145)
Total Bilirubin: 0.3 mg/dL (ref 0.0–1.2)
Total Protein: 7.1 g/dL (ref 6.5–8.1)

## 2024-03-13 LAB — BASIC METABOLIC PANEL WITH GFR
Anion gap: 12 (ref 5–15)
BUN: 26 mg/dL — ABNORMAL HIGH (ref 6–20)
CO2: 23 mmol/L (ref 22–32)
Calcium: 9.4 mg/dL (ref 8.9–10.3)
Chloride: 103 mmol/L (ref 98–111)
Creatinine, Ser: 1.22 mg/dL (ref 0.61–1.24)
GFR, Estimated: >60 mL/min (ref >60)
Glucose, Bld: 330 mg/dL — ABNORMAL HIGH (ref 70–99)
Potassium: 4.5 mmol/L (ref 3.5–5.1)
Sodium: 137 mmol/L (ref 135–145)

## 2024-03-13 LAB — T4, FREE: Free T4: 0.87 ng/dL (ref 0.61–1.12)

## 2024-03-13 LAB — CBG MONITORING, ED
Glucose-Capillary: 390 mg/dL — ABNORMAL HIGH (ref 70–99)
Glucose-Capillary: 416 mg/dL — ABNORMAL HIGH (ref 70–99)
Glucose-Capillary: 309 mg/dL — ABNORMAL HIGH (ref 70–99)

## 2024-03-13 LAB — HEMOGLOBIN A1C
Hgb A1c MFr Bld: 8.8 % — ABNORMAL HIGH (ref 4.8–5.6)
Mean Plasma Glucose: 205.86 mg/dL

## 2024-03-13 LAB — MRSA NEXT GEN BY PCR, NASAL: MRSA by PCR Next Gen: NOT DETECTED

## 2024-03-13 LAB — TSH: TSH: 1.74 u[IU]/mL (ref 0.350–4.500)

## 2024-03-13 LAB — PROCALCITONIN: Procalcitonin: 0.23 ng/mL

## 2024-03-13 LAB — AMMONIA: Ammonia: 33 umol/L (ref 9–35)

## 2024-03-13 LAB — FOLATE: Folate: >20 ng/mL (ref >5.9)

## 2024-03-13 LAB — VITAMIN B12: Vitamin B-12: 812 pg/mL (ref 180–914)

## 2024-03-13 MED ORDER — CHLORHEXIDINE GLUCONATE CLOTH 2 % EX PADS
6.0000 | MEDICATED_PAD | Freq: Every day | CUTANEOUS | Status: DC
  Administered 2024-03-13 – 2024-03-25 (×12): 6 via TOPICAL

## 2024-03-13 MED ORDER — INSULIN GLARGINE-YFGN 100 UNIT/ML ~~LOC~~ SOLN
20.0000 [IU] | SUBCUTANEOUS | Status: DC
  Administered 2024-03-13 – 2024-03-14 (×2): 20 [IU] via SUBCUTANEOUS
  Filled 2024-03-13 (×2): qty 0.2

## 2024-03-13 MED ORDER — ONDANSETRON HCL 4 MG PO TABS
4.0000 mg | ORAL_TABLET | Freq: Four times a day (QID) | ORAL | Status: DC | PRN
  Administered 2024-03-13: 4 mg via ORAL
  Filled 2024-03-13: qty 1

## 2024-03-13 MED ORDER — INSULIN ASPART 100 UNIT/ML IJ SOLN: 0.0000 [IU] | Freq: Every day | INTRAMUSCULAR | Status: DC

## 2024-03-13 MED ORDER — INSULIN REGULAR(HUMAN) IN NACL 100-0.9 UT/100ML-% IV SOLN
INTRAVENOUS | Status: DC
  Administered 2024-03-13: 7 [IU]/h via INTRAVENOUS
  Filled 2024-03-13: qty 100

## 2024-03-13 MED ORDER — SODIUM BICARBONATE 8.4 % IV SOLN
50.0000 meq | Freq: Once | INTRAVENOUS | Status: AC
  Administered 2024-03-13: 50 meq via INTRAVENOUS
  Filled 2024-03-13: qty 50

## 2024-03-13 MED ORDER — INSULIN ASPART 100 UNIT/ML IJ SOLN
0.0000 [IU] | Freq: Three times a day (TID) | INTRAMUSCULAR | Status: DC
  Administered 2024-03-13: 8 [IU] via SUBCUTANEOUS
  Administered 2024-03-14: 5 [IU] via SUBCUTANEOUS
  Filled 2024-03-13: qty 12
  Filled 2024-03-13: qty 5

## 2024-03-13 MED ORDER — INSULIN ASPART 100 UNIT/ML IJ SOLN
4.0000 [IU] | Freq: Three times a day (TID) | INTRAMUSCULAR | Status: DC
  Administered 2024-03-13 – 2024-03-14 (×4): 4 [IU] via SUBCUTANEOUS
  Filled 2024-03-13 (×3): qty 4

## 2024-03-13 MED ORDER — QUETIAPINE FUMARATE 25 MG PO TABS
25.0000 mg | ORAL_TABLET | Freq: Every morning | ORAL | Status: DC
  Administered 2024-03-14 – 2024-03-25 (×11): 25 mg via ORAL
  Filled 2024-03-13 (×12): qty 1

## 2024-03-13 MED ORDER — HALOPERIDOL LACTATE 5 MG/ML IJ SOLN
1.0000 mg | Freq: Once | INTRAMUSCULAR | Status: AC
  Administered 2024-03-13: 1 mg via INTRAVENOUS
  Filled 2024-03-13: qty 1

## 2024-03-13 MED ORDER — ACETAMINOPHEN 325 MG PO TABS
650.0000 mg | ORAL_TABLET | Freq: Four times a day (QID) | ORAL | Status: DC | PRN
  Administered 2024-03-13 – 2024-03-24 (×2): 650 mg via ORAL
  Filled 2024-03-13 (×2): qty 2

## 2024-03-13 MED ORDER — PROCHLORPERAZINE EDISYLATE 10 MG/2ML IJ SOLN: 5.0000 mg | Freq: Four times a day (QID) | INTRAMUSCULAR | Status: DC | PRN

## 2024-03-13 MED ORDER — QUETIAPINE FUMARATE 25 MG PO TABS
75.0000 mg | ORAL_TABLET | Freq: Every day | ORAL | Status: DC
  Administered 2024-03-13 – 2024-03-24 (×12): 75 mg via ORAL
  Filled 2024-03-13 (×2): qty 3
  Filled 2024-03-13 (×3): qty 2
  Filled 2024-03-13 (×2): qty 3
  Filled 2024-03-13: qty 2
  Filled 2024-03-13 (×2): qty 3
  Filled 2024-03-13: qty 2
  Filled 2024-03-13 (×2): qty 3

## 2024-03-13 MED ORDER — ACETAMINOPHEN 500 MG PO TABS: 500.0000 mg | ORAL_TABLET | Freq: Four times a day (QID) | ORAL | Status: DC | PRN

## 2024-03-13 MED ORDER — ACETAMINOPHEN 650 MG RE SUPP: 650.0000 mg | Freq: Four times a day (QID) | RECTAL | Status: DC | PRN

## 2024-03-13 MED ORDER — ENOXAPARIN SODIUM 40 MG/0.4ML IJ SOSY: 40.0000 mg | PREFILLED_SYRINGE | INTRAMUSCULAR | Status: DC

## 2024-03-13 MED ORDER — HEPARIN SODIUM (PORCINE) 5000 UNIT/ML IJ SOLN
5000.0000 [IU] | Freq: Three times a day (TID) | INTRAMUSCULAR | Status: DC
  Administered 2024-03-13 – 2024-03-25 (×34): 5000 [IU] via SUBCUTANEOUS
  Filled 2024-03-13 (×33): qty 1

## 2024-03-13 MED ORDER — DOLUTEGRAVIR-LAMIVUDINE 50-300 MG PO TABS
1.0000 | ORAL_TABLET | Freq: Every day | ORAL | Status: DC
  Administered 2024-03-13 – 2024-03-16 (×4): 1 via ORAL
  Filled 2024-03-13 (×5): qty 1

## 2024-03-13 MED ORDER — DEXTROSE-SODIUM CHLORIDE 5-0.45 % IV SOLN: INTRAVENOUS | Status: DC

## 2024-03-13 MED ORDER — DOLUTEGRAVIR-LAMIVUDINE 50-300 MG PO TABS
1.0000 | ORAL_TABLET | Freq: Every day | ORAL | Status: DC
  Filled 2024-03-13: qty 1

## 2024-03-13 MED ORDER — INSULIN GLARGINE-YFGN 100 UNIT/ML ~~LOC~~ SOLN
30.0000 [IU] | SUBCUTANEOUS | Status: DC
  Filled 2024-03-13: qty 0.3

## 2024-03-13 MED ORDER — ALBUTEROL SULFATE (2.5 MG/3ML) 0.083% IN NEBU
2.5000 mg | INHALATION_SOLUTION | Freq: Once | RESPIRATORY_TRACT | Status: AC
  Administered 2024-03-13: 2.5 mg via RESPIRATORY_TRACT
  Filled 2024-03-13: qty 3

## 2024-03-13 MED ORDER — DEXTROSE 50 % IV SOLN
0.0000 mL | INTRAVENOUS | Status: DC | PRN
  Filled 2024-03-13 (×2): qty 50

## 2024-03-13 MED ORDER — ONDANSETRON HCL 4 MG/2ML IJ SOLN: 4.0000 mg | Freq: Four times a day (QID) | INTRAMUSCULAR | Status: DC | PRN

## 2024-03-13 MED ORDER — SODIUM ZIRCONIUM CYCLOSILICATE 10 G PO PACK
10.0000 g | PACK | ORAL | Status: AC
  Administered 2024-03-13: 10 g via ORAL
  Filled 2024-03-13: qty 1

## 2024-03-13 MED ORDER — HALOPERIDOL LACTATE 2 MG/ML PO CONC
2.0000 mg | Freq: Three times a day (TID) | ORAL | Status: DC | PRN
  Administered 2024-03-13 – 2024-03-14 (×2): 2 mg via ORAL
  Filled 2024-03-13 (×3): qty 5

## 2024-03-13 MED ORDER — SODIUM CHLORIDE 0.9 % IV SOLN: INTRAVENOUS | Status: DC

## 2024-03-13 MED ORDER — POLYETHYLENE GLYCOL 3350 17 G PO PACK: 17.0000 g | PACK | Freq: Every day | ORAL | Status: DC | PRN

## 2024-03-13 MED ORDER — SODIUM CHLORIDE 0.9 % IV BOLUS
1000.0000 mL | Freq: Once | INTRAVENOUS | Status: AC
  Administered 2024-03-13: 1000 mL via INTRAVENOUS

## 2024-03-13 NOTE — H&P (Signed)
 History and Physical  PatientIsrael Reed FMW:[id number] DOB: 28-Dec-1965 DOA: 03/12/2024 DOS: the patient was seen and examined on 03/13/2024 Patient coming from: Home  Chief Complaint: High sugar.   HPI: Patient with PMH of T1DM on insulin  pump, HTN, alcohol dependence, HIV, positive presented to the hospital with complaints of high sugars. History is limited as the patient is unable to provide complete history. Discussed with wife on the phone. Wife reports that the patient has been using insulin  pump without any problems. He has a Dexcom G7 CBG monitor. His monitor was reading high therefore she initially attempted to change the site. And the monitor will continue to read high for blood sugars more than 400 and therefore she called the representative who checked that the CBG monitor was working appropriately. She also noted that the insulin  pump was also providing medicine appropriately. Despite which the patient's blood sugars remained high. Patient also became more lethargic and confused. And therefore she brought the patient to the hospital. There is no report of fever chills nausea vomiting diarrhea constipation fall trauma injury. There is also no report of recent change in his medications. She provides medication of the patient and there is no concern for about over or underdosing further medication. Patient denies any complaints of chest pain back pain shortness of breath cough fever chills. Patient has had some slurred speech as well as some confusion which she reports that the patient has been having this since July or 2025 when he had low sugar and was hospitalized for delirium. It seems patient was rehospitalized in October for delirium.  Assessment and plan. Hyperglycemia. Type 1 diabetes mellitus dependent on insulin  pump. Uncontrolled with hyperglycemia with long-term neuropathy. Patient uses 25 units of long-acting insulin  with basal bolus regimen at home when he is  not on insulin  pump. It does not appear that the patient has pump failure causing hypoglycemia. Patient does not have DKA nor meets criteria for HONK. Due to high sugars patient was started on IV insulin  with IV fluid. Currently his blood sugars have improved significantly. We will advance the patient to allow oral diet, and switch off the IV insulin  pump and initiate basal bolus regimen. Given patient's lethargy will underdosed with Semglee  20 units, moderate sliding scale as well as 4 units 3 times daily Premeal coverage. Hemoglobin A1c is 8.8. Patient has a scheduled appointment with endocrine outpatient on 12/15.  Procalcitonin, urinalysis as well as blood culture performed to identify possible etiology of hyperglycemia.  For now no indication to initiate antibiotic.  COVID-negative.  Influenza negative.  RSV negative.   Acute metabolic encephalopathy. Delirium in the setting of AKI and hyperglycemia. Currently holding psychotropic medication including gabapentin , Zoloft. Continuing Seroquel. Metabolic workup including B12, folic acid , ammonia unremarkable. Monitor.  AKI. Takes lisinopril  at home. Likely AKI in the setting of dehydration in the setting of hyperglycemia. Improving with IV hydration. Monitor.  Hyperkalemia. Mild. Currently corrected. Monitor. Hold lisinopril .  HTN. Blood pressure stable. Takes Coreg and lisinopril  at home. Currently on hold.   GERD. Continuing PPI twice daily.  Mood disorder. Home regimen includes Ativan , Seroquel, Zoloft. Continuing Zoloft for now.  Diabetic neuropathy. Gabapentin . Currently holding the medication until mentation improves.  Anemia. Chronic. Etiology not clear.  No active bleeding. For now monitor.  Advance Care Planning:   Code Status: Full Code  Consults: None  Prior to Admission medications  Medication Sig Start Date End Date Taking? Authorizing Provider  ARTIFICIAL TEARS PF 0.1-0.3 % SOLN Place 1  drop  into both eyes 3 (three) times daily as needed (for dryness).   Yes [provider]  BAQSIMI  ONE PACK 3 MG/DOSE POWD Use 1 spray in 1 nostril once as directed for severe low blood sugar (hypoglycemia). 02/13/24  Yes [provider]  carvedilol (COREG) 6.25 MG tablet Take 6.25 mg by mouth 2 (two) times daily. 02/19/24 02/18/25 Yes [provider]  Continuous Glucose Sensor (DEXCOM G7 SENSOR) MISC Apply 1 Device topically See admin instructions. Apply 1 device every 10 days   Yes [provider]  DOVATO  50-300 MG tablet TAKE 1 TABLET BY MOUTH ONCE DAILY. TAKE 2 HOURS BEFORE MULTIVITAMIN, SUCRALAFATE, AND FEEDING SUPPLEMENT Patient taking differently: Take 1 tablet by mouth daily. 06/15/22  Yes Teresa Carrier, MD  gabapentin  (NEURONTIN ) 300 MG capsule Take 600 mg by mouth 3 (three) times daily.   Yes [provider]  haloperidol (HALDOL) 2 MG/ML solution Take 2 mg by mouth 2 (two) times daily.   Yes [provider]  insulin  glargine (LANTUS ) 100 UNIT/ML injection Inject 25 Units into the skin daily.   Yes [provider]  insulin  lispro (HUMALOG ) 100 UNIT/ML injection See admin instructions. Use up to 50 units daily via pump   Yes [provider]  LORazepam  (ATIVAN ) 0.5 MG tablet Take 0.5 mg by mouth every 6 (six) hours as needed.   Yes [provider]  magnesium  oxide (MAG-OX) 400 MG tablet Take 400 mg by mouth daily. 01/05/24 04/04/24 Yes [provider]  QUEtiapine (SEROQUEL) 25 MG tablet Take 25 mg by mouth every morning. Given in the morning 03/14/24  Yes [provider]  QUEtiapine (SEROQUEL) 25 MG tablet Take 75 mg by mouth at bedtime.   Yes [provider]  sertraline (ZOLOFT) 50 MG tablet Take 50 mg by mouth in the morning. 11/28/23  Yes [provider]  TYLENOL  500 MG tablet Take 500-1,000 mg by mouth every 6 (six) hours as needed for mild pain (pain score 1-3), moderate pain (pain  score 4-6) or headache.   Yes [provider]  cyclobenzaprine (FLEXERIL) 10 MG tablet Take 10 mg by mouth daily as needed for muscle spasms. Patient not taking: Reported on 03/13/2024 07/25/22   [provider]  docusate sodium  (COLACE) 100 MG capsule Take 1 capsule (100 mg total) by mouth every 12 (twelve) hours. Patient not taking: Reported on 03/13/2024 06/01/23   Harris, Abigail, PA-C  HYDROcodone -acetaminophen  (NORCO/VICODIN) 5-325 MG tablet Take 1 tablet by mouth every 6 (six) hours as needed. Patient not taking: Reported on 03/13/2024 06/01/23   Harris, Abigail, PA-C  insulin  glargine, 2 Unit Dial , (TOUJEO  MAX) 300 UNIT/ML Solostar Pen Inject 30 Units into the skin daily. Patient not taking: Reported on 03/13/2024 10/11/22   Odell Celinda Balo, MD  insulin  glargine-yfgn (SEMGLEE ) 100 UNIT/ML Pen SMARTSIG:25 Unit(s) SUB-Q Daily Patient not taking: Reported on 03/13/2024 02/08/24   [provider]  lisinopril  (ZESTRIL ) 5 MG tablet Take 5 mg by mouth daily. Patient not taking: Reported on 03/13/2024    [provider]  Multiple Vitamin (MULTI-VITAMIN) tablet Take 1 tablet by mouth daily with breakfast. Patient not taking: Reported on 03/13/2024 08/21/18   [provider]  nicotine  (NICODERM CQ  - DOSED IN MG/24 HOURS) 14 mg/24hr patch Place 14 mg onto the skin daily. Patient not taking: Reported on 03/13/2024    [provider]  NON FORMULARY Take 1 capsule by mouth See admin instructions. UMZU Testro-X Testosterone Supplement for Men- Take 1  capsule by mouth once a day Patient not taking: Reported on 03/13/2024    [provider]  NON FORMULARY Take 1 capsule by mouth See admin instructions. Sea Moss- Take 1 capsule by mouth once a day Patient not taking: Reported on 03/13/2024    [provider]  NON FORMULARY Take 1-3 g by mouth See admin instructions. Pure Himalayan Shilajit- Mix 1-3 grams into a beverage and drink by  mouth once a day to improve blood flow Patient not taking: Reported on 03/13/2024    [provider]  pantoprazole  (PROTONIX ) 40 MG tablet Take 1 tablet (40 mg total) by mouth daily. Patient not taking: Reported on 03/13/2024 04/28/23 05/28/23  Christobal Guadalajara, MD  tadalafil (CIALIS) 20 MG tablet Take 20 mg by mouth daily as needed for erectile dysfunction. Patient not taking: Reported on 03/13/2024 12/14/21   [provider]  traZODone  (DESYREL ) 50 MG tablet Take 50 mg by mouth at bedtime as needed for sleep. Patient not taking: Reported on 03/13/2024 11/25/20   [provider]    Past Medical History:  Diagnosis Date   Diabetes mellitus without complication (HCC)    HIV (human immunodeficiency virus infection) (HCC)    Hypertension    Past Surgical History:  Procedure Laterality Date   IR GASTROSTOMY TUBE MOD SED  11/05/2023   Social History:  reports that he has been smoking cigarettes. He has never used smokeless tobacco. He reports current alcohol use of about 4.0 standard drinks of alcohol per week. He reports that he does not use drugs. Allergies[1] History reviewed. No pertinent family history. Physical Exam: Vitals:   03/13/24 1200 03/13/24 1400 03/13/24 1445 03/13/24 1500  BP: (!) 160/82 131/75  (!) 141/79  Pulse: 86 86  76  Resp: 19 16  19   Temp:   98.5 F (36.9 C)   TempSrc:   Oral   SpO2: 100% 100%  100%  Weight:      Height:       General: in Mild distress, No Rash Cardiovascular: S1 and S2 Present, No Murmur Respiratory: Good respiratory effort, Bilateral Air entry present. No Crackles, No wheezes Abdomen: Bowel Sound present, No tenderness Extremities: No edema Neuro: Alert and oriented x3, slurred speech, no asterixis.  No motor deficit.  Data Reviewed: I have reviewed ED notes, Vitals, Lab results and outpatient records. Since last encounter, pertinent lab results CBC and BMP   . I have ordered test including CBC and BMP  . I have  ordered imaging CT head  .   Family Communication: Discussed with wife on the phone  Author: Yetta Blanch, MD 03/13/2024 4:30 PM For on call review [email or web address].      [1] No Known Allergies

## 2024-03-13 NOTE — Plan of Care (Signed)

## 2024-03-13 NOTE — ED Provider Notes (Signed)
 [place] EMERGENCY DEPARTMENT AT [facility] Provider Note   CSN: [id number] Arrival date & time: 03/12/24  2321     Patient presents with: Hyperglycemia   Carl Reed is a 58 y.o. male.   The history is provided by the spouse. The history is limited by the condition of the patient.  Hyperglycemia Blood sugar level PTA:  400s Severity:  Severe Onset quality:  Gradual Duration:  1 day Timing:  Constant Progression:  Unchanged Chronicity:  New Diabetes status:  Controlled with insulin  (has a pump and untol 24 hours ago sugars were normal no changes in medication nor diet) Associated symptoms: no abdominal pain, no fever, no syncope, no vomiting, no weakness and no weight change   Risk factors: hx of DKA   Patient with metabolic encephalopathy,DM and HIV presents with elevated glucose at home on his new pump.      Past Medical History:  Diagnosis Date   Diabetes mellitus without complication (HCC)    HIV (human immunodeficiency virus infection) (HCC)    Hypertension      Prior to Admission medications  Medication Sig Start Date End Date Taking? Authorizing Provider  ARTIFICIAL TEARS PF 0.1-0.3 % SOLN Place 1 drop into both eyes 3 (three) times daily as needed (for dryness).   Yes [provider]  BAQSIMI  ONE PACK 3 MG/DOSE POWD Use 1 spray in 1 nostril once as directed for severe low blood sugar (hypoglycemia). 02/13/24  Yes [provider]  carvedilol (COREG) 6.25 MG tablet Take 6.25 mg by mouth 2 (two) times daily. 02/19/24 02/18/25 Yes [provider]  Continuous Glucose Sensor (DEXCOM G7 SENSOR) MISC Apply 1 Device topically See admin instructions. Apply 1 device every 10 days   Yes [provider]  DOVATO  50-300 MG tablet TAKE 1 TABLET BY MOUTH ONCE DAILY. TAKE 2 HOURS BEFORE MULTIVITAMIN, SUCRALAFATE, AND FEEDING SUPPLEMENT Patient taking differently: Take 1 tablet by mouth daily. 06/15/22  Yes Teresa Carrier, MD   gabapentin  (NEURONTIN ) 300 MG capsule Take 600 mg by mouth 3 (three) times daily.   Yes [provider]  haloperidol (HALDOL) 2 MG/ML solution Take 2 mg by mouth 2 (two) times daily.   Yes [provider]  insulin  lispro (HUMALOG ) 100 UNIT/ML injection See admin instructions. Use up to 50 units daily via pump   Yes [provider]  LORazepam  (ATIVAN ) 0.5 MG tablet Take 0.5 mg by mouth every 6 (six) hours as needed.   Yes [provider]  magnesium  oxide (MAG-OX) 400 MG tablet Take 400 mg by mouth daily. 01/05/24 04/04/24 Yes [provider]  sertraline (ZOLOFT) 50 MG tablet Take 50 mg by mouth in the morning. 11/28/23  Yes [provider]  TYLENOL  500 MG tablet Take 500-1,000 mg by mouth every 6 (six) hours as needed for mild pain (pain score 1-3), moderate pain (pain score 4-6) or headache.   Yes [provider]  cyclobenzaprine (FLEXERIL) 10 MG tablet Take 10 mg by mouth daily as needed for muscle spasms. Patient not taking: Reported on 03/13/2024 07/25/22   [provider]  docusate sodium  (COLACE) 100 MG capsule Take 1 capsule (100 mg total) by mouth every 12 (twelve) hours. Patient not taking: Reported on 03/13/2024 06/01/23   Harris, Abigail, PA-C  HYDROcodone -acetaminophen  (NORCO/VICODIN) 5-325 MG tablet Take 1 tablet by mouth every 6 (six) hours as needed. Patient not taking: Reported on 03/13/2024 06/01/23   Harris, Abigail, PA-C  insulin  glargine, 2 Unit Dial , (TOUJEO  MAX)  300 UNIT/ML Solostar Pen Inject 30 Units into the skin daily. Patient not taking: Reported on 03/13/2024 10/11/22   Odell Celinda Balo, MD  insulin  glargine-yfgn (SEMGLEE ) 100 UNIT/ML Pen SMARTSIG:25 Unit(s) SUB-Q Daily Patient not taking: Reported on 03/13/2024 02/08/24   [provider]  lisinopril  (ZESTRIL ) 5 MG tablet Take 5 mg by mouth daily. Patient not taking: Reported on 03/13/2024    [provider]  Multiple Vitamin  (MULTI-VITAMIN) tablet Take 1 tablet by mouth daily with breakfast. Patient not taking: Reported on 03/13/2024 08/21/18   [provider]  nicotine  (NICODERM CQ  - DOSED IN MG/24 HOURS) 14 mg/24hr patch Place 14 mg onto the skin daily. Patient not taking: Reported on 03/13/2024    [provider]  NON FORMULARY Take 1 capsule by mouth See admin instructions. UMZU Testro-X Testosterone Supplement for Men- Take 1 capsule by mouth once a day Patient not taking: Reported on 03/13/2024    [provider]  NON FORMULARY Take 1 capsule by mouth See admin instructions. Sea Moss- Take 1 capsule by mouth once a day Patient not taking: Reported on 03/13/2024    [provider]  NON FORMULARY Take 1-3 g by mouth See admin instructions. Pure Himalayan Shilajit- Mix 1-3 grams into a beverage and drink by mouth once a day to improve blood flow Patient not taking: Reported on 03/13/2024    [provider]  pantoprazole  (PROTONIX ) 40 MG tablet Take 1 tablet (40 mg total) by mouth daily. Patient not taking: Reported on 03/13/2024 04/28/23 05/28/23  Christobal Guadalajara, MD  tadalafil (CIALIS) 20 MG tablet Take 20 mg by mouth daily as needed for erectile dysfunction. Patient not taking: Reported on 03/13/2024 12/14/21   [provider]  traZODone  (DESYREL ) 50 MG tablet Take 50 mg by mouth at bedtime as needed for sleep. Patient not taking: Reported on 03/13/2024 11/25/20   [provider]    Allergies: Patient has no known allergies.    Review of Systems  Unable to perform ROS: Acuity of condition  Constitutional:  Negative for fever.  Respiratory:  Negative for wheezing and stridor.   Cardiovascular:  Negative for syncope.  Gastrointestinal:  Negative for abdominal pain and vomiting.  Neurological:  Negative for weakness.    Updated Vital Signs BP 131/74   Pulse 90   Temp (!) 97.5 F (36.4 C) (Axillary)   Resp 12   Ht 5' 11 (1.803 m)   Wt 59.9 kg    SpO2 100%   BMI 18.41 kg/m   Physical Exam Vitals and nursing note reviewed.  Constitutional:      General: He is not in acute distress.    Appearance: Normal appearance. He is well-developed. He is not diaphoretic.  HENT:     Head: Normocephalic and atraumatic.     Nose: Nose normal.  Eyes:     Conjunctiva/sclera: Conjunctivae normal.     Pupils: Pupils are equal, round, and reactive to light.  Cardiovascular:     Rate and Rhythm: Normal rate and regular rhythm.     Pulses: Normal pulses.     Heart sounds: Normal heart sounds.  Pulmonary:     Effort: Pulmonary effort is normal.     Breath sounds: Normal breath sounds. No wheezing or rales.  Abdominal:     General: Bowel sounds are normal.     Palpations: Abdomen is soft.     Tenderness: There is no abdominal tenderness. There is no guarding or rebound.  Musculoskeletal:  General: Normal range of motion.     Cervical back: Normal range of motion and neck supple.  Skin:    General: Skin is warm and dry.  Neurological:     Mental Status: He is alert.     Deep Tendon Reflexes: Reflexes normal.     (all labs ordered are listed, but only abnormal results are displayed) Results for orders placed or performed during the hospital encounter of 03/12/24  CBG monitoring, ED   Collection Time: 03/12/24 11:36 PM  Result Value Ref Range   Glucose-Capillary 436 (H) 70 - 99 mg/dL  CBC   Collection Time: 03/12/24 11:41 PM  Result Value Ref Range   WBC 6.3 4.0 - 10.5 K/uL   RBC 3.11 (L) 4.22 - 5.81 MIL/uL   Hemoglobin 9.5 (L) 13.0 - 17.0 g/dL   HCT 70.3 (L) 60.9 - 47.9 %   MCV 95.2 80.0 - 100.0 fL   MCH 30.5 26.0 - 34.0 pg   MCHC 32.1 30.0 - 36.0 g/dL   RDW 86.2 88.4 - 84.4 %   Platelets 257 150 - 400 K/uL   nRBC 0.0 0.0 - 0.2 %  Comprehensive metabolic panel   Collection Time: 03/12/24 11:41 PM  Result Value Ref Range   Sodium 133 (L) 135 - 145 mmol/L   Potassium 6.0 (H) 3.5 - 5.1 mmol/L   Chloride 100 98 - 111  mmol/L   CO2 23 22 - 32 mmol/L   Glucose, Bld 471 (H) 70 - 99 mg/dL   BUN 27 (H) 6 - 20 mg/dL   Creatinine, Ser 8.55 (H) 0.61 - 1.24 mg/dL   Calcium  9.5 8.9 - 10.3 mg/dL   Total Protein 7.1 6.5 - 8.1 g/dL   Albumin 3.6 3.5 - 5.0 g/dL   AST 40 15 - 41 U/L   ALT 42 0 - 44 U/L   Alkaline Phosphatase 195 (H) 38 - 126 U/L   Total Bilirubin 0.3 0.0 - 1.2 mg/dL   GFR, Estimated 57 (L) >60 mL/min   Anion gap 10 5 - 15  I-stat chem 8, ed   Collection Time: 03/12/24 11:47 PM  Result Value Ref Range   Sodium 135 135 - 145 mmol/L   Potassium 5.9 (H) 3.5 - 5.1 mmol/L   Chloride 102 98 - 111 mmol/L   BUN 28 (H) 6 - 20 mg/dL   Creatinine, Ser 8.39 (H) 0.61 - 1.24 mg/dL   Glucose, Bld 527 (H) 70 - 99 mg/dL   Calcium , Ion 1.20 1.15 - 1.40 mmol/L   TCO2 23 22 - 32 mmol/L   Hemoglobin 10.5 (L) 13.0 - 17.0 g/dL   HCT 68.9 (L) 60.9 - 47.9 %  Blood gas, venous (at [facility] and [facility])   Collection Time: 03/13/24  2:33 AM  Result Value Ref Range   pH, Ven 7.33 7.25 - 7.43   pCO2, Ven 37 (L) 44 - 60 mmHg   pO2, Ven 60 (H) 32 - 45 mmHg   Bicarbonate 19.5 (L) 20.0 - 28.0 mmol/L   Acid-base deficit 5.9 (H) 0.0 - 2.0 mmol/L   O2 Saturation 93.2 %   Patient temperature 37.0    Drawn by [id number]   CBG monitoring, ED   Collection Time: 03/13/24  2:43 AM  Result Value Ref Range   Glucose-Capillary 416 (H) 70 - 99 mg/dL  CBG monitoring, ED   Collection Time: 03/13/24  3:14 AM  Result Value Ref Range   Glucose-Capillary 390 (H) 70 - 99 mg/dL  CBG  monitoring, ED   Collection Time: 03/13/24  4:19 AM  Result Value Ref Range   Glucose-Capillary 309 (H) 70 - 99 mg/dL   No results found.   Radiology: No results found.   .Critical Care  Performed by: Nettie Earing, MD Authorized by: Nettie Earing, MD   Critical care provider statement:    Critical care time (minutes):  60   Critical care end time:  03/13/2024 5:03 AM   Critical care was necessary to treat or prevent imminent or life-threatening  deterioration of the following conditions:  Endocrine crisis (hyperkalemia and AKI)   Critical care was time spent personally by me on the following activities:  Development of treatment plan with patient or surrogate, discussions with consultants, evaluation of patient's response to treatment, examination of patient, ordering and review of laboratory studies, ordering and review of radiographic studies, ordering and performing treatments and interventions, pulse oximetry, re-evaluation of patient's condition and review of old charts   I assumed direction of critical care for this patient from another provider in my specialty: no     Care discussed with: admitting provider      Medications Ordered in the ED  insulin  regular, human (MYXREDLIN ) 100 units/ 100 mL infusion (7 Units/hr Intravenous New Bag/Given 03/13/24 0247)  enoxaparin  (LOVENOX ) injection 40 mg (has no administration in time range)  acetaminophen  (TYLENOL ) tablet 500 mg (has no administration in time range)  prochlorperazine (COMPAZINE) injection 5 mg (has no administration in time range)  polyethylene glycol (MIRALAX / GLYCOLAX) packet 17 g (has no administration in time range)  dextrose  50 % solution 0-50 mL (has no administration in time range)  0.9 %  sodium chloride  infusion ( Intravenous New Bag/Given 03/13/24 0423)  dextrose  5 % and 0.45 % NaCl infusion (has no administration in time range)  Chlorhexidine  Gluconate Cloth 2 % PADS 6 each (has no administration in time range)  sodium chloride  0.9 % bolus 1,000 mL (0 mLs Intravenous Stopped 03/13/24 0249)  haloperidol lactate (HALDOL) injection 1 mg (1 mg Intravenous Given 03/13/24 0229)  sodium bicarbonate injection 50 mEq (50 mEq Intravenous Given 03/13/24 0315)  sodium zirconium cyclosilicate (LOKELMA) packet 10 g (10 g Oral Given 03/13/24 0316)  albuterol  (PROVENTIL ) (2.5 MG/3ML) 0.083% nebulizer solution 2.5 mg (2.5 mg Nebulization Given 03/13/24 0315)                                     Medical Decision Making Patient with elevated glucose at home for 1 day   Amount and/or Complexity of Data Reviewed Independent Historian: spouse and EMS    Details: See above  External Data Reviewed: labs and notes.    Details: Previous admissions reviewed through care everywhere  Labs: ordered.    Details: 2 potassium: elevated 5.9 and 6, elevated creatinine 1.6 and 1.44,  elevated glucose 471, normal anion gap.  Urine without uti, normal white count low hemoglobin 9.6, normal platelets.    Risk Prescription drug management. Decision regarding hospitalization.     Final diagnoses:  AKI (acute kidney injury)  Hyperkalemia  Hyperglycemia   The patient appears reasonably stabilized for admission considering the current resources, flow, and capabilities available in the ED at this time, and I doubt any other [facility] requiring further screening and/or treatment in the ED prior to admission.  ED Discharge Orders     None          Verona Hartshorn,  MD 03/13/24 [id number]

## 2024-03-13 NOTE — Inpatient Diabetes Management (Signed)
 Inpatient Diabetes Program Recommendations  AACE/ADA: New Consensus Statement on Inpatient Glycemic Control (2015)  Target Ranges:  Prepandial:   less than 140 mg/dL      Peak postprandial:   less than 180 mg/dL (1-2 hours)      Critically ill patients:  140 - 180 mg/dL   Lab Results  Component Value Date   GLUCAP 263 (H) 03/13/2024   HGBA1C 8.8 (H) 03/13/2024    Review of Glycemic Control  Diabetes history: DM1 Outpatient Diabetes medications: iLet Beta Bionic insulin  pump, started back on 02/27/2024. Humalog  in pump, Dexcom G7 Current orders for Inpatient glycemic control: Semglee  20 Q24H, Novolog  0-15 TID with meals and 0-5 HS + 4 units TID  HgbA1C 8.8% Endo: Dhaval Patel - [facility] Found unresponsive on 10/15/2023 from hypoglycemia  Inpatient Diabetes Program Recommendations:    Semglee  20-22 units daily  Novolog  0-15 TID with meals and 0-5 HS  Meal coverage Novolog  4-5 units TID with meals if eating > 50%  Spoke with pt's wife on phone this afternoon. She said pt has Endo appt on 12/15 and she would continue Lantus  and Humalog  until the appt. Endo can restart pump at OV. Pt's wife did not know the I:C ratio, but said she puts in [facility] and pump does the calculations. She has s/s at home for when he's off pump. (Correction) 150-200 - 10 units 201-250 - 11 units  251-300 - 12 units  Agree with orders. Fine tune as needed according to blood sugars.  Follow daily.  Thank you. Shona Brandy, RD, LDN, CDCES Inpatient Diabetes Coordinator [phone]

## 2024-03-14 LAB — CBC
HCT: 29.6 % — ABNORMAL LOW (ref 39.0–52.0)
Hemoglobin: 9.3 g/dL — ABNORMAL LOW (ref 13.0–17.0)
MCH: 29.8 pg (ref 26.0–34.0)
MCHC: 31.4 g/dL (ref 30.0–36.0)
MCV: 94.9 fL (ref 80.0–100.0)
Platelets: 228 K/uL (ref 150–400)
RBC: 3.12 MIL/uL — ABNORMAL LOW (ref 4.22–5.81)
RDW: 13.7 % (ref 11.5–15.5)
WBC: 4.4 K/uL (ref 4.0–10.5)
nRBC: 0 % (ref 0.0–0.2)

## 2024-03-14 LAB — BASIC METABOLIC PANEL WITH GFR
Anion gap: 12 (ref 5–15)
Anion gap: 11 (ref 5–15)
BUN: 19 mg/dL (ref 6–20)
BUN: 18 mg/dL (ref 6–20)
CO2: 23 mmol/L (ref 22–32)
CO2: 23 mmol/L (ref 22–32)
Calcium: 10 mg/dL (ref 8.9–10.3)
Calcium: 9.8 mg/dL (ref 8.9–10.3)
Chloride: 103 mmol/L (ref 98–111)
Chloride: 103 mmol/L (ref 98–111)
Creatinine, Ser: 1.25 mg/dL — ABNORMAL HIGH (ref 0.61–1.24)
Creatinine, Ser: 1.18 mg/dL (ref 0.61–1.24)
GFR, Estimated: >60 mL/min (ref >60)
GFR, Estimated: >60 mL/min (ref >60)
Glucose, Bld: 220 mg/dL — ABNORMAL HIGH (ref 70–99)
Glucose, Bld: 264 mg/dL — ABNORMAL HIGH (ref 70–99)
Potassium: 4.5 mmol/L (ref 3.5–5.1)
Potassium: 4.2 mmol/L (ref 3.5–5.1)
Sodium: 138 mmol/L (ref 135–145)
Sodium: 137 mmol/L (ref 135–145)

## 2024-03-14 LAB — GLUCOSE, CAPILLARY
Glucose-Capillary: 220 mg/dL — ABNORMAL HIGH (ref 70–99)
Glucose-Capillary: 86 mg/dL (ref 70–99)
Glucose-Capillary: 49 mg/dL — ABNORMAL LOW (ref 70–99)
Glucose-Capillary: 50 mg/dL — ABNORMAL LOW (ref 70–99)
Glucose-Capillary: 195 mg/dL — ABNORMAL HIGH (ref 70–99)
Glucose-Capillary: 215 mg/dL — ABNORMAL HIGH (ref 70–99)

## 2024-03-14 LAB — URINE CULTURE: Culture: <10000 — AB

## 2024-03-14 MED ORDER — GABAPENTIN 300 MG PO CAPS
600.0000 mg | ORAL_CAPSULE | Freq: Three times a day (TID) | ORAL | Status: DC
  Administered 2024-03-14: 600 mg via ORAL
  Filled 2024-03-14: qty 2

## 2024-03-14 MED ORDER — SERTRALINE HCL 50 MG PO TABS
50.0000 mg | ORAL_TABLET | Freq: Every day | ORAL | Status: DC
  Administered 2024-03-14 – 2024-03-25 (×11): 50 mg via ORAL
  Filled 2024-03-14 (×12): qty 1

## 2024-03-14 MED ORDER — INSULIN ASPART 100 UNIT/ML IJ SOLN
0.0000 [IU] | INTRAMUSCULAR | Status: DC
  Administered 2024-03-14: 3 [IU] via SUBCUTANEOUS
  Administered 2024-03-15: 5 [IU] via SUBCUTANEOUS
  Administered 2024-03-15: 1 [IU] via SUBCUTANEOUS
  Administered 2024-03-15: 5 [IU] via SUBCUTANEOUS
  Administered 2024-03-15: 2 [IU] via SUBCUTANEOUS
  Administered 2024-03-16: 1 [IU] via SUBCUTANEOUS
  Administered 2024-03-16 (×2): 7 [IU] via SUBCUTANEOUS
  Administered 2024-03-16: 2 [IU] via SUBCUTANEOUS
  Administered 2024-03-16: 7 [IU] via SUBCUTANEOUS
  Administered 2024-03-16 – 2024-03-17 (×2): 3 [IU] via SUBCUTANEOUS
  Administered 2024-03-17: 14:00:00 2 [IU] via SUBCUTANEOUS
  Administered 2024-03-17: 04:00:00 5 [IU] via SUBCUTANEOUS
  Administered 2024-03-17: 21:00:00 7 [IU] via SUBCUTANEOUS
  Administered 2024-03-17: 09:00:00 3 [IU] via SUBCUTANEOUS
  Administered 2024-03-18: 22:00:00 5 [IU] via SUBCUTANEOUS
  Administered 2024-03-18 (×2): 3 [IU] via SUBCUTANEOUS
  Administered 2024-03-18: 01:00:00 9 [IU] via SUBCUTANEOUS
  Administered 2024-03-18: 04:00:00 2 [IU] via SUBCUTANEOUS
  Administered 2024-03-19: 13:00:00 9 [IU] via SUBCUTANEOUS
  Administered 2024-03-19: 09:00:00 2 [IU] via SUBCUTANEOUS
  Administered 2024-03-19: 02:00:00 3 [IU] via SUBCUTANEOUS
  Filled 2024-03-14: qty 7
  Filled 2024-03-14 (×3): qty 2
  Filled 2024-03-14 (×2): qty 5
  Filled 2024-03-14 (×2): qty 3
  Filled 2024-03-14: qty 1
  Filled 2024-03-14: qty 3
  Filled 2024-03-14: qty 7
  Filled 2024-03-14: qty 2
  Filled 2024-03-14: qty 3
  Filled 2024-03-14: qty 1
  Filled 2024-03-14: qty 5
  Filled 2024-03-14: qty 3
  Filled 2024-03-14: qty 9
  Filled 2024-03-14: qty 5
  Filled 2024-03-14: qty 2
  Filled 2024-03-14: qty 7
  Filled 2024-03-14: qty 3
  Filled 2024-03-14: qty 2
  Filled 2024-03-14: qty 9

## 2024-03-14 MED ORDER — GABAPENTIN 100 MG PO CAPS: 100.0000 mg | ORAL_CAPSULE | Freq: Three times a day (TID) | ORAL | Status: DC

## 2024-03-14 MED ORDER — INSULIN GLARGINE-YFGN 100 UNIT/ML ~~LOC~~ SOLN: 15.0000 [IU] | Freq: Every day | SUBCUTANEOUS | Status: DC

## 2024-03-14 MED ORDER — SODIUM CHLORIDE 0.9 % IV BOLUS: 500.0000 mL | Freq: Once | INTRAVENOUS | Status: DC

## 2024-03-14 MED ORDER — SODIUM CHLORIDE 0.9 % IV BOLUS
1000.0000 mL | Freq: Once | INTRAVENOUS | Status: AC
  Administered 2024-03-14: 1000 mL via INTRAVENOUS

## 2024-03-14 MED ORDER — LORAZEPAM 0.5 MG PO TABS
0.5000 mg | ORAL_TABLET | Freq: Four times a day (QID) | ORAL | Status: DC | PRN
  Administered 2024-03-15 – 2024-03-18 (×4): 0.5 mg via ORAL
  Filled 2024-03-14 (×4): qty 1

## 2024-03-14 MED ORDER — GABAPENTIN 300 MG PO CAPS: 300.0000 mg | ORAL_CAPSULE | Freq: Every day | ORAL | Status: DC

## 2024-03-14 MED ORDER — DEXTROSE 50 % IV SOLN
INTRAVENOUS | Status: AC
  Administered 2024-03-14: 50 mL
  Filled 2024-03-14: qty 50

## 2024-03-14 MED ORDER — INSULIN ASPART 100 UNIT/ML IJ SOLN: 2.0000 [IU] | Freq: Three times a day (TID) | INTRAMUSCULAR | Status: DC

## 2024-03-14 NOTE — Progress Notes (Signed)
 [facility] Progress Note Patient: Carl Reed FMW:[id number] DOB: June 20, 1965  DOA: 03/12/2024 DOS: the patient was seen and examined on 03/14/2024  Brief Hospital Course: Patient with PMH of T1DM on insulin  pump, HTN, alcohol dependence, HIV, positive presented to the hospital with complaints of high sugars. Has severe hyperglycemia without DKA which was treated with IV insulin . Also acute delirium which per wife has been ongoing since July 2025.  Assessment and plan. Hyperglycemia. Type 1 diabetes mellitus dependent on insulin  pump. Uncontrolled with hyperglycemia with long-term neuropathy. Patient is on insulin  pump at home. When not using his pump patient uses 25 units of long-acting insulin . Patient with blood sugar more than 400. Treated with IV insulin  and IV fluid. Switch to basal bolus regimen with Semglee  of 20 units only. On 12/12 patient remains drowsy and therefore with poor p.o. intake and developed hypoglycemic episodes. Changing back to every 4 hours sliding scale for now. Hemoglobin A1c is 8.8. Patient has a scheduled appointment with endocrine outpatient on 12/15.   Procalcitonin, urinalysis as well as blood culture performed to identify possible etiology of hyperglycemia which is negative. COVID-negative.  Influenza negative.  RSV negative.  Acute metabolic encephalopathy. Acute on chronic chronic delirium in the setting of AKI and hyperglycemia. Patient is on gabapentin , Zoloft and Seroquel at home. Seroquel was resumed on the day of the admission patient remained agitated that day. Home regimen of gabapentin  and Zoloft were resumed on 12/12. Patient remains drowsy and therefore currently Zoloft gabapentin  have been discontinued again. Metabolic workup is unremarkable. CT scan of the head unremarkable as well for acute abnormality. No focal deficit. Treat hypoglycemia.   AKI. Takes lisinopril  at home. Likely AKI in the setting of dehydration in the  setting of hyperglycemia. Improving with IV hydration. Monitor.   Hyperkalemia. Mild.  Corrected.   HTN. Now with hypotension. Blood pressure stable in supine. Upon orthostatic blood pressure drops. Takes Coreg and lisinopril  at home. Currently on hold. Due to orthostatic I will provide IV fluid bolus.   GERD. Continuing PPI twice daily.   Mood disorder. Home regimen includes Ativan , Seroquel, Zoloft. Holding them for now.   Diabetic neuropathy. Gabapentin .  On hold.   Anemia. Chronic. Etiology not clear.  No active bleeding. For now monitor.  Underweight (BMI) of less than 18.5  Body mass index is 18.31 kg/m.  Placing the patient at a high risk for outcome.   Subjective: No nausea no vomiting no fever no chills.  Was alert and awake in the morning.  Over night was agitated.  Now lethargic.  Blood sugars low.  Physical Exam: Pupils are equal and round and react to light. Clear to auscultation. S1-S2 present Bowel sounds Able to tolerate food.  Data Reviewed: I have Reviewed nursing notes, Vitals, and Lab results. Since last encounter, pertinent lab results CBC and BMP   . I have ordered test including CBC and BMP  .   Disposition: Status is: Inpatient Remains inpatient appropriate because: Monitor for improvement in mentation and oral intake and stability of blood sugars.  heparin  injection 5,000 Units Start: 03/13/24 1400   Family Communication: No one at bedside Level of care: Stepdown continue in stepdown unit. Vitals:   03/14/24 1400 03/14/24 1500 03/14/24 1600 03/14/24 1602  BP: 101/68 120/71 136/78 136/78  Pulse:   81 71  Resp:   (!) 22 15  Temp:      TempSrc:      SpO2:   100% 100%  Weight:  Height:         Author: Yetta Blanch, MD 03/14/2024 5:06 PM  Please look on [email or web address] to find out who is on call.

## 2024-03-14 NOTE — Progress Notes (Signed)
 Hypoglycemic Event  CBG: 50  Treatment: D50 50 mL (25 gm)  Symptoms: Sweaty  Follow-up CBG: Time:1655 CBG Result:195  Possible Reasons for Event: Inadequate meal intake  Comments/MD notified:yes     [employer]

## 2024-03-14 NOTE — Evaluation (Signed)
 Physical Therapy Evaluation Patient Details Name: Carl Reed MRN: [id number] DOB: 1965-12-16 Today's Date: 03/14/2024  History of Present Illness  Patient is a 58 y/o male admitted 03/12/24 due to significant hyperglycemia and delirium due to encephalopathy.  PMH positive for  DM on insulin  pump, HTN, ETOH dependence, HIV.  Clinical Impression  Patient presents with decreased mobility due to generalized weakness, decreased arousal, attention, decreased sitting balance and activity intolerance.  Previously living with wife in two level home though unable to state prior functional level with intermittent arousal.  Patient able to mobilize overall with min A though with BP dropping during upright mobility (RN aware).  Patient will benefit from skilled PT in the acute setting and likely will be able to d/c home with HHPT pending medical improvements.    Orthostatic VS for the past 24 hrs (Last 3 readings):  BP- Lying BP- Sitting BP- Standing at 0 minutes  03/14/24 1544 99/60 (!) 83/54 (!) 68/47         If plan is discharge home, recommend the following: A little help with walking and/or transfers;A little help with bathing/dressing/bathroom;Help with stairs or ramp for entrance;Assistance with cooking/housework;Assist for transportation   Can travel by private vehicle        Equipment Recommendations None recommended by PT  Recommendations for Other Services       Functional Status Assessment Patient has had a recent decline in their functional status and demonstrates the ability to make significant improvements in function in a reasonable and predictable amount of time.     Precautions / Restrictions Precautions Precautions: Fall Precaution/Restrictions Comments: watch BP      Mobility  Bed Mobility Overal bed mobility: Needs Assistance Bed Mobility: Supine to Sit, Sit to Supine     Supine to sit: Supervision, HOB elevated, Contact guard Sit to supine: Min assist    General bed mobility comments: up to EOB increased time, using bed features increased time, CGA for balance; to supine assist for positioning    Transfers Overall transfer level: Needs assistance   Transfers: Sit to/from Stand Sit to Stand: Min assist           General transfer comment: assist for balance    Ambulation/Gait Ambulation/Gait assistance: Min assist Gait Distance (Feet): 1 Feet Assistive device: 1 person hand held assist Gait Pattern/deviations: Step-to pattern       General Gait Details: side stepped to [facility]  Stairs            Wheelchair Mobility     Tilt Bed    Modified Rankin (Stroke Patients Only)       Balance Overall balance assessment: Needs assistance   Sitting balance-Leahy Scale: Fair     Standing balance support: Single extremity supported Standing balance-Leahy Scale: Poor                               Pertinent Vitals/Pain Pain Assessment Pain Assessment: No/denies pain    Home Living Family/patient expects to be discharged to:: Private residence Living Arrangements: Spouse/significant other   Type of Home: House Home Access: Stairs to enter       Home Layout: Two level Home Equipment: None      Prior Function Prior Level of Function : Patient poor historian/Family not available             Mobility Comments: reports wife stays at home and reports two level home though not able to  complete assessment with intermittent participation       Extremity/Trunk Assessment   Upper Extremity Assessment Upper Extremity Assessment: Generalized weakness    Lower Extremity Assessment Lower Extremity Assessment: Generalized weakness    Cervical / Trunk Assessment Cervical / Trunk Assessment: Normal  Communication   Communication Communication: No apparent difficulties    Cognition Arousal: Obtunded Behavior During Therapy: Flat affect   PT - Cognitive impairments: No family/caregiver present  to determine baseline, Difficult to assess Difficult to assess due to: Level of arousal                       Following commands: Impaired Following commands impaired: Follows one step commands inconsistently, Follows one step commands with increased time     Cueing Cueing Techniques: Verbal cues     General Comments General comments (skin integrity, edema, etc.): dropping BP with upright mobility, RN aware see flowsheets    Exercises     Assessment/Plan    PT Assessment Patient needs continued PT services  PT Problem List Decreased strength;Decreased activity tolerance;Decreased balance;Decreased mobility;Decreased safety awareness;Decreased cognition       PT Treatment Interventions DME instruction;Gait training;Stair training;Functional mobility training;Therapeutic activities;Therapeutic exercise;Balance training;Cognitive remediation;Patient/family education    PT Goals (Current goals can be found in the Care Plan section)  Acute Rehab PT Goals Patient Stated Goal: unable to state PT Goal Formulation: Patient unable to participate in goal setting Time For Goal Achievement: 03/28/24 Potential to Achieve Goals: Good    Frequency Min 3X/week     Co-evaluation               AM-PAC PT 6 Clicks Mobility  Outcome Measure Help needed turning from your back to your side while in a flat bed without using bedrails?: A Little Help needed moving from lying on your back to sitting on the side of a flat bed without using bedrails?: A Little Help needed moving to and from a bed to a chair (including a wheelchair)?: A Little Help needed standing up from a chair using your arms (e.g., wheelchair or bedside chair)?: A Little Help needed to walk in hospital room?: Total Help needed climbing 3-5 steps with a railing? : Total 6 Click Score: 14    End of Session Equipment Utilized During Treatment: Gait belt Activity Tolerance: Treatment limited secondary to medical  complications (Comment) Patient left: in bed;with call bell/phone within reach;with bed alarm set Nurse Communication: Mobility status;Other (comment) (orthostatic) PT Visit Diagnosis: Other abnormalities of gait and mobility (R26.89)    Time: 8847-8785 PT Time Calculation (min) (ACUTE ONLY): 22 min   Charges:   PT Evaluation $PT Eval Moderate Complexity: 1 Mod   PT General Charges $$ ACUTE PT VISIT: 1 Visit         Micheline Portal, PT Acute Rehabilitation Services [phone] 03/14/2024   Montie Portal 03/14/2024, 3:44 PM

## 2024-03-14 NOTE — Hospital Course (Addendum)
 Patient with PMH of T1DM on insulin  pump, HTN, alcohol dependence, HIV, positive presented to the hospital with complaints of high sugars. Has severe hyperglycemia without DKA which was treated with IV insulin . Also acute delirium which per wife has been ongoing since July 2025.  Assessment and plan. Hyperglycemia. Type 1 diabetes mellitus dependent on insulin  pump. Uncontrolled with hyperglycemia with long-term neuropathy. Patient is on insulin  pump at home. When not using his pump patient uses 25 units of long-acting insulin . Patient with blood sugar more than 400. Treated with IV insulin  and IV fluid. Switch to basal bolus regimen with Semglee  of 20 units only. On 12/12 patient remains drowsy and therefore with poor p.o. intake and developed hypoglycemic episodes. Changing back to every 4 hours sliding scale for now. Hemoglobin A1c is 8.8. Patient has a scheduled appointment with endocrine outpatient on 12/15.   Procalcitonin, urinalysis as well as blood culture performed to identify possible etiology of hyperglycemia which is negative. COVID-negative.  Influenza negative.  RSV negative.  Acute metabolic encephalopathy. Acute on chronic chronic delirium in the setting of AKI and hyperglycemia. Patient is on gabapentin , Zoloft  and Seroquel  at home. Seroquel  was resumed on the day of the admission patient remained agitated that day. Home regimen of gabapentin  and Zoloft  were resumed on 12/12. Patient remains drowsy and therefore currently Zoloft  gabapentin  have been discontinued again. Metabolic workup is unremarkable. CT scan of the head unremarkable as well for acute abnormality. On 12/13 agitated again in the morning.  Requiring restraints and IV Haldol .  Will monitor closely in the stepdown unit.  Resuming his gabapentin  at a lower dose.   AKI. Takes lisinopril  at home. Likely AKI in the setting of dehydration in the setting of hyperglycemia. Improving with IV  hydration. Monitor.   Hyperkalemia. Mild.  Corrected.   HTN. Now with hypotension. Blood pressure stable in supine. Upon orthostatic blood pressure drops. Takes Coreg  and lisinopril  at home. Currently on hold. Due to orthostatic I will provide IV fluid bolus.   GERD. Continuing PPI twice daily.   Mood disorder. Home regimen includes Ativan , Seroquel , Zoloft . Holding them for now.   Diabetic neuropathy. Gabapentin .  On hold.   Anemia. Chronic. Etiology not clear.  No active bleeding. For now monitor.  Underweight (BMI) of less than 18.5  Body mass index is 18.31 kg/m.  Placing the patient at a high risk for outcome.

## 2024-03-14 NOTE — Evaluation (Signed)
 Speech Language Pathology Evaluation Patient Details Name: Carl Reed MRN: [id number] DOB: Oct 15, 1965 Today's Date: 03/14/2024 Time: 1110-1120 SLP Time Calculation (min) (ACUTE ONLY): 10 min  Problem List:  Patient Active Problem List   Diagnosis Date Noted   AMS (altered mental status) 03/13/2024   Macrocytic anemia 04/26/2023   Tobacco use disorder 04/26/2023   Alcohol abuse 04/26/2023   Abnormal LFTs 10/10/2022   AKI (acute kidney injury) 10/10/2022   Hypomagnesemia 06/07/2022   Avascular necrosis of bone of hip, left (HCC) 06/06/2022   DKA (diabetic ketoacidosis) (HCC) 03/27/2020   Diabetes (HCC) 03/27/2020   HIV (human immunodeficiency virus infection) (HCC) 03/27/2020   GERD (gastroesophageal reflux disease) 03/27/2020   Hx of dizziness 03/27/2020   Closed displaced fracture of proximal phalanx of left little finger with routine healing 05/08/2019   Alcoholism (HCC) 06/18/2015   Diabetic polyneuropathy associated with type 1 diabetes mellitus (HCC) 12/25/2013   Essential hypertension 02/17/2013   Past Medical History:  Past Medical History:  Diagnosis Date   Diabetes mellitus without complication (HCC)    HIV (human immunodeficiency virus infection) (HCC)    Hypertension    Past Surgical History:  Past Surgical History:  Procedure Laterality Date   IR GASTROSTOMY TUBE MOD SED  11/05/2023   HPI:  Carl Reed is a 58 y.o. male who presented from home to [facility] ED on 03/12/24 with hyperglycemia, acute metabolic encephalopathy and hx of cognitive impairment. PMHx includes T1DM on insulin  pump, HTN, alcohol dependence, HIB.  Has  had prior SLP evals during [facility] admissions, finding generalized cognitive and language deficits, as well as transient dysphagias due to acute mental status changes.   Assessment / Plan / Recommendation Clinical Impression  Pt was lethargic and participation was limited. He roused briefly but required frequent prompting to stay awake. No focal  CN deficits.  Speech volume was low but fluent. Able to name family members and state basic biographical information; followed simple commands.  Poor attention given overall mental status.  Dx is metabolic encephalopathy, per chart, he has some baseline cognitive deficits.  Anticipate return to baseline function upon completion of treatment.  No acute care SLP role is recommended nor would be beneficial to add to his current treatment plan. Our service will sign off.    SLP Assessment  SLP Recommendation/Assessment: Patient does not need any further Speech Language Pathology Services SLP Visit Diagnosis: Cognitive communication deficit (R41.841)                        SLP Evaluation Cognition  Overall Cognitive Status: Impaired/Different from baseline Arousal/Alertness: Lethargic Orientation Level: Disoriented X4 Attention: Sustained Sustained Attention: Impaired Sustained Attention Impairment: Verbal basic Awareness: Impaired       Comprehension  Auditory Comprehension Overall Auditory Comprehension: Other (comment) (followed basic commands) Reading Comprehension Reading Status: Not tested    Expression Expression Primary Mode of Expression: Verbal Verbal Expression Overall Verbal Expression: Appears within functional limits for tasks assessed   Oral / Motor  Oral Motor/Sensory Function Overall Oral Motor/Sensory Function: Within functional limits Motor Speech Overall Motor Speech: Appears within functional limits for tasks assessed            Carl Reed 03/14/2024, 11:32 AM Carl L. Vona, MA CCC/SLP Clinical Specialist - Acute Care SLP Acute Rehabilitation Services Office number [phone]

## 2024-03-14 NOTE — Evaluation (Signed)
 Occupational Therapy Evaluation Patient Details Name: Carl Reed MRN: [id number] DOB: 1965/09/14 Today's Date: 03/14/2024   History of Present Illness   Patient is a 58 yr old male admitted 03/12/24 due to significant hyperglycemia and delirium due to encephalopathy.  PMH: DM on insulin  pump, HTN, ETOH dependence, HIV.     Clinical Impressions During the session today, the pt was non-verbal throughout. He also presented with decreased initiation and impaired command follow. He was unable to provide information regarding his prior level of functioning and living situation. He required CGA for rolling left and right in bed and mod to max hand-over-hand assist to perform face washing and simple self-feeding in bed. He was with decreased overall alertness and had max difficulty keeping his eyes open and maintaining consistent active participation in the session. As such, his ADL performance is largely compromised. OT anticipates his overall ADL performance will be much improved, pending improvement in his overall arousal/alertness. OT will continue to follow him for services in the acute care setting. Discharge recommendations will be updated accordingly.          If plan is discharge home, recommend the following:   Help with stairs or ramp for entrance;Assistance with cooking/housework;Direct supervision/assist for medications management;Supervision due to cognitive status;Assistance with feeding;Direct supervision/assist for financial management;A lot of help with bathing/dressing/bathroom;A lot of help with walking and/or transfers;Assist for transportation     Functional Status Assessment   Patient has had a recent decline in their functional status and demonstrates the ability to make significant improvements in function in a reasonable and predictable amount of time.     Equipment Recommendations   Other (comment) (to be determined)     Recommendations for Other  Services         Precautions/Restrictions   Precautions Precautions: Fall Restrictions Weight Bearing Restrictions Per Provider Order: No     Mobility Bed Mobility Overal bed mobility: Needs Assistance Bed Mobility: Rolling Rolling: Contact guard assist (he performed rolling left and right with CGA)              Transfers        General transfer comment: not attempted, given the pt's decreased arousal and impaired command follow          ADL either performed or assessed with clinical judgement   ADL Overall ADL's : Needs assistance/impaired Eating/Feeding: Moderate assistance;Bed level;Cueing for sequencing   Grooming: Maximal assistance;Bed level;Cueing for sequencing               Lower Body Dressing: Total assistance;Bed level        General ADL Comments: The pt's current ADL performance is largely compromised, due to his difficulty maintaining arousal. During the session today, he required mod to max hand-over-hand assist for face washing and for drining from a cup while in bed. He did not initiate attempts at dressing tasks when prompted. Based on clinical judgement, the pt's ADL performance will be much improved, pending improvement in his overall arousal/alertness.      Pertinent Vitals/Pain Pain Assessment Pain Assessment: Faces Pain Score: 0-No pain     Extremity/Trunk Assessment Upper Extremity Assessment Upper Extremity Assessment: Difficult to assess due to impaired cognition and decreased arousal)   Lower Extremity Assessment Lower Extremity Assessment: Difficult to assess due to impaired cognition and decreased arousal)      Communication Communication Communication: No apparent difficulties Factors Affecting Communication: Difficulty expressing self   Cognition Arousal: Obtunded Behavior During Therapy: Flat affect Cognition: Difficult to  assess             OT - Cognition Comments: Pt did not respond to orientation  questions. He also presented with decreased initiation and command follow. He was non-verbal throughout the session.                 Following commands: Impaired Following commands impaired: Follows one step commands inconsistently     Cueing  General Comments   Cueing Techniques: Verbal cues;Gestural cues;Tactile cues  dropping BP with upright mobility, RN aware see flowsheets           Home Living Family/patient expects to be discharged to:: Private residence Living Arrangements: Spouse/significant other   Type of Home: House Home Access: Stairs to enter     Home Layout: Two level        Home Equipment: None   Additional Comments: Information contained here was taken from a PT note completed earlier in the day on 03-14-24, as the pt could not provide information regarding his prior level of functioning and living situation during the OT evaluation, due to decreased alertness.  Lives With: Spouse    Prior Functioning/Environment Prior Level of Function : Patient poor historian/Family not available             Mobility Comments: reports wife stays at home and reports two level home    OT Problem List: Decreased strength;Impaired balance (sitting and/or standing);Decreased coordination;Decreased cognition;Decreased knowledge of use of DME or AE;Decreased knowledge of precautions   OT Treatment/Interventions: Self-care/ADL training;Therapeutic exercise;Energy conservation;DME and/or AE instruction;Cognitive remediation/compensation;Therapeutic activities;Balance training;Patient/family education      OT Goals(Current goals can be found in the care plan section)   Acute Rehab OT Goals OT Goal Formulation: Patient unable to participate in goal setting Time For Goal Achievement: 03/28/24 Potential to Achieve Goals: Good ADL Goals Pt Will Perform Grooming: with modified independence;standing Pt Will Perform Upper Body Dressing: with modified  independence;sitting Pt Will Perform Lower Body Dressing: with modified independence;sit to/from stand Pt Will Transfer to Toilet: with modified independence;ambulating Pt Will Perform Toileting - Clothing Manipulation and hygiene: with modified independence;sit to/from stand   OT Frequency:  Min 2X/week       AM-PAC OT 6 Clicks Daily Activity     Outcome Measure Help from another person eating meals?: A Lot Help from another person taking care of personal grooming?: A Lot Help from another person toileting, which includes using toliet, bedpan, or urinal?: A Lot Help from another person bathing (including washing, rinsing, drying)?: A Lot Help from another person to put on and taking off regular upper body clothing?: A Lot Help from another person to put on and taking off regular lower body clothing?: Total 6 Click Score: 11   End of Session Equipment Utilized During Treatment: Other (comment) (N/A) Nurse Communication: Other (comment) (nurse cleared the pt for therapy participation)  Activity Tolerance: Patient limited by lethargy Patient left: in bed;with call bell/phone within reach;with bed alarm set  OT Visit Diagnosis: Muscle weakness (generalized) (M62.81);Feeding difficulties (R63.3);Other symptoms and signs involving cognitive function                Time: 1700-1714 OT Time Calculation (min): 14 min Charges:  OT General Charges $OT Visit: 1 Visit OT Evaluation $OT Eval Moderate Complexity: 1 Mod    Zacharey Jensen J Harris, OTR/L 03/14/2024, 5:29 PM

## 2024-03-15 LAB — GLUCOSE, CAPILLARY
Glucose-Capillary: 148 mg/dL — ABNORMAL HIGH (ref 70–99)
Glucose-Capillary: 159 mg/dL — ABNORMAL HIGH (ref 70–99)
Glucose-Capillary: 182 mg/dL — ABNORMAL HIGH (ref 70–99)
Glucose-Capillary: 194 mg/dL — ABNORMAL HIGH (ref 70–99)
Glucose-Capillary: 254 mg/dL — ABNORMAL HIGH (ref 70–99)
Glucose-Capillary: 267 mg/dL — ABNORMAL HIGH (ref 70–99)
Glucose-Capillary: 47 mg/dL — ABNORMAL LOW (ref 70–99)
Glucose-Capillary: 80 mg/dL (ref 70–99)

## 2024-03-15 LAB — BASIC METABOLIC PANEL WITH GFR
Anion gap: 11 (ref 5–15)
BUN: 14 mg/dL (ref 6–20)
CO2: 25 mmol/L (ref 22–32)
Calcium: 10 mg/dL (ref 8.9–10.3)
Chloride: 105 mmol/L (ref 98–111)
Creatinine, Ser: 1.02 mg/dL (ref 0.61–1.24)
GFR, Estimated: >60 mL/min (ref >60)
Glucose, Bld: 48 mg/dL — ABNORMAL LOW (ref 70–99)
Potassium: 3.9 mmol/L (ref 3.5–5.1)
Sodium: 141 mmol/L (ref 135–145)

## 2024-03-15 MED ORDER — HALOPERIDOL LACTATE 5 MG/ML IJ SOLN
2.0000 mg | Freq: Four times a day (QID) | INTRAMUSCULAR | Status: DC | PRN
  Administered 2024-03-15 – 2024-03-21 (×8): 2 mg via INTRAVENOUS
  Filled 2024-03-15 (×7): qty 1

## 2024-03-15 MED ORDER — LORAZEPAM 2 MG/ML IJ SOLN
1.0000 mg | Freq: Once | INTRAMUSCULAR | Status: AC
  Administered 2024-03-15: 1 mg via INTRAVENOUS
  Filled 2024-03-15: qty 1

## 2024-03-15 MED ORDER — DEXTROSE 50 % IV SOLN
25.0000 g | INTRAVENOUS | Status: AC
  Administered 2024-03-15: 25 g via INTRAVENOUS

## 2024-03-15 MED ORDER — GABAPENTIN 300 MG PO CAPS
300.0000 mg | ORAL_CAPSULE | Freq: Three times a day (TID) | ORAL | Status: DC
  Administered 2024-03-15 – 2024-03-25 (×29): 300 mg via ORAL
  Filled 2024-03-15 (×31): qty 1

## 2024-03-15 MED ORDER — DEXTROSE-SODIUM CHLORIDE 5-0.9 % IV SOLN: INTRAVENOUS | Status: DC

## 2024-03-15 MED ORDER — GLUCERNA SHAKE PO LIQD
237.0000 mL | Freq: Three times a day (TID) | ORAL | Status: DC
  Administered 2024-03-15 – 2024-03-22 (×17): 237 mL via ORAL
  Filled 2024-03-15 (×25): qty 237

## 2024-03-15 MED ORDER — HALOPERIDOL LACTATE 5 MG/ML IJ SOLN
INTRAMUSCULAR | Status: AC
  Filled 2024-03-15: qty 1

## 2024-03-15 MED ORDER — ENSURE PLUS HIGH PROTEIN PO LIQD: 237.0000 mL | Freq: Two times a day (BID) | ORAL | Status: DC

## 2024-03-15 NOTE — Progress Notes (Signed)
 Hypoglycemic Event  CBG: 47  Treatment: D50 50 mL (25 gm)  Symptoms: Sweaty, Shaky, and Nervous/irritable  Follow-up CBG: Time: 0423 CBG Result: 182  Possible Reasons for Event: Inadequate meal intake  Comments/MD notified:Carl Andrez, NP (see new orders)    Carl Reed [place]

## 2024-03-15 NOTE — Progress Notes (Signed)
 [facility] Progress Note Patient: Carl Reed FMW:[id number] DOB: 1965-06-12  DOA: 03/12/2024 DOS: the patient was seen and examined on 03/15/2024  Brief Hospital Course: Patient with PMH of T1DM on insulin  pump, HTN, alcohol dependence, HIV, positive presented to the hospital with complaints of high sugars. Has severe hyperglycemia without DKA which was treated with IV insulin . Also acute delirium which per wife has been ongoing since July 2025.  Assessment and plan. Hyperglycemia. Type 1 diabetes mellitus dependent on insulin  pump. Uncontrolled with hyperglycemia with long-term neuropathy. Patient is on insulin  pump at home. When not using his pump patient uses 25 units of long-acting insulin . Patient with blood sugar more than 400. Treated with IV insulin  and IV fluid. Switch to basal bolus regimen with Semglee  of 20 units only. On 12/12 patient remains drowsy and therefore with poor p.o. intake and developed hypoglycemic episodes. Changing back to every 4 hours sliding scale for now. Hemoglobin A1c is 8.8. Patient has a scheduled appointment with endocrine outpatient on 12/15.   Procalcitonin, urinalysis as well as blood culture performed to identify possible etiology of hyperglycemia which is negative. COVID-negative.  Influenza negative.  RSV negative.  Acute metabolic encephalopathy. Acute on chronic chronic delirium in the setting of AKI and hyperglycemia. Patient is on gabapentin , Zoloft  and Seroquel  at home. Seroquel  was resumed on the day of the admission patient remained agitated that day. Home regimen of gabapentin  and Zoloft  were resumed on 12/12. Patient remains drowsy and therefore currently Zoloft  gabapentin  have been discontinued again. Metabolic workup is unremarkable. CT scan of the head unremarkable as well for acute abnormality. On 12/13 agitated again in the morning.  Requiring restraints and IV Haldol .  Will monitor closely in the stepdown unit.   Resuming his gabapentin  at a lower dose.   AKI. Takes lisinopril  at home. Likely AKI in the setting of dehydration in the setting of hyperglycemia. Improving with IV hydration. Monitor.   Hyperkalemia. Mild.  Corrected.   HTN. Now with hypotension. Blood pressure stable in supine. Upon orthostatic blood pressure drops. Takes Coreg  and lisinopril  at home. Currently on hold. Due to orthostatic I will provide IV fluid bolus.   GERD. Continuing PPI twice daily.   Mood disorder. Home regimen includes Ativan , Seroquel , Zoloft . Holding them for now.   Diabetic neuropathy. Gabapentin .  On hold.   Anemia. Chronic. Etiology not clear.  No active bleeding. For now monitor.  Underweight (BMI) of less than 18.5  Body mass index is 18.31 kg/m.  Placing the patient at a high risk for outcome.   Subjective: Agitated.  Locked himself in the bathroom.  No nausea or vomiting.  Reports that he has to go to the bathroom multiple times for BM.  No abdominal pain.  Later on attempted to get up again and from the bed without any clear indication  Physical Exam: Clear to auscultation. S1-S2 present present bowel sound present Speech slurred at his baseline. No focal deficit.  Data Reviewed: I have Reviewed nursing notes, Vitals, and Lab results. Since last encounter, pertinent lab results CBC and BMP   . I have ordered test including CBC and BMP  .   Disposition: Status is: Inpatient Remains inpatient appropriate because: Monitor for improvement in mentation and stability of blood glucose levels  heparin  injection 5,000 Units Start: 03/13/24 1400   Family Communication: No one at bedside Level of care: Stepdown   Vitals:   03/15/24 1530 03/15/24 1600 03/15/24 1700 03/15/24 1817  BP:  (!) 150/94 (!) 155/80 ROLLEN)  144/91  Pulse:  99 94 (!) 103  Resp:  19 20 (!) 24  Temp: 98.1 F (36.7 C)     TempSrc: Oral     SpO2:  100% 100% 100%  Weight:      Height:          Author: Yetta Blanch, MD 03/15/2024 7:13 PM  Please look on [email or web address] to find out who is on call.

## 2024-03-16 LAB — GLUCOSE, CAPILLARY
Glucose-Capillary: 129 mg/dL — ABNORMAL HIGH (ref 70–99)
Glucose-Capillary: 309 mg/dL — ABNORMAL HIGH (ref 70–99)
Glucose-Capillary: 302 mg/dL — ABNORMAL HIGH (ref 70–99)
Glucose-Capillary: 306 mg/dL — ABNORMAL HIGH (ref 70–99)
Glucose-Capillary: 320 mg/dL — ABNORMAL HIGH (ref 70–99)
Glucose-Capillary: 204 mg/dL — ABNORMAL HIGH (ref 70–99)
Glucose-Capillary: 120 mg/dL — ABNORMAL HIGH (ref 70–99)

## 2024-03-16 LAB — CBC WITH DIFFERENTIAL/PLATELET
Abs Immature Granulocytes: 0.02 K/uL (ref 0.00–0.07)
Basophils Absolute: 0 K/uL (ref 0.0–0.1)
Basophils Relative: 0 %
Eosinophils Absolute: 0.1 K/uL (ref 0.0–0.5)
Eosinophils Relative: 3 %
HCT: 30 % — ABNORMAL LOW (ref 39.0–52.0)
Hemoglobin: 9.6 g/dL — ABNORMAL LOW (ref 13.0–17.0)
Immature Granulocytes: 0 %
Lymphocytes Relative: 45 %
Lymphs Abs: 2.1 K/uL (ref 0.7–4.0)
MCH: 30.1 pg (ref 26.0–34.0)
MCHC: 32 g/dL (ref 30.0–36.0)
MCV: 94 fL (ref 80.0–100.0)
Monocytes Absolute: 0.4 K/uL (ref 0.1–1.0)
Monocytes Relative: 9 %
Neutro Abs: 2 K/uL (ref 1.7–7.7)
Neutrophils Relative %: 43 %
Platelets: 247 K/uL (ref 150–400)
RBC: 3.19 MIL/uL — ABNORMAL LOW (ref 4.22–5.81)
RDW: 13.4 % (ref 11.5–15.5)
WBC: 4.6 K/uL (ref 4.0–10.5)
nRBC: 0 % (ref 0.0–0.2)

## 2024-03-16 LAB — COMPREHENSIVE METABOLIC PANEL WITH GFR
ALT: 33 U/L (ref 0–44)
AST: 30 U/L (ref 15–41)
Albumin: 3.6 g/dL (ref 3.5–5.0)
Alkaline Phosphatase: 165 U/L — ABNORMAL HIGH (ref 38–126)
Anion gap: 10 (ref 5–15)
BUN: 17 mg/dL (ref 6–20)
CO2: 22 mmol/L (ref 22–32)
Calcium: 9.9 mg/dL (ref 8.9–10.3)
Chloride: 101 mmol/L (ref 98–111)
Creatinine, Ser: 1.11 mg/dL (ref 0.61–1.24)
GFR, Estimated: >60 mL/min (ref >60)
Glucose, Bld: 331 mg/dL — ABNORMAL HIGH (ref 70–99)
Potassium: 4.4 mmol/L (ref 3.5–5.1)
Sodium: 134 mmol/L — ABNORMAL LOW (ref 135–145)
Total Bilirubin: 0.4 mg/dL (ref 0.0–1.2)
Total Protein: 7.1 g/dL (ref 6.5–8.1)

## 2024-03-16 LAB — MAGNESIUM: Magnesium: 1.7 mg/dL (ref 1.7–2.4)

## 2024-03-16 LAB — VITAMIN B1: Vitamin B1 (Thiamine): 205.8 nmol/L — ABNORMAL HIGH (ref 66.5–200.0)

## 2024-03-16 MED ORDER — INSULIN GLARGINE 100 UNIT/ML ~~LOC~~ SOLN
5.0000 [IU] | Freq: Every day | SUBCUTANEOUS | Status: DC
  Administered 2024-03-16: 5 [IU] via SUBCUTANEOUS
  Filled 2024-03-16 (×2): qty 0.05

## 2024-03-16 NOTE — Progress Notes (Addendum)
 [facility] Progress Note Patient: Carl Reed FMW:[id number] DOB: January 22, 1966  DOA: 03/12/2024 DOS: the patient was seen and examined on 03/16/2024  Brief Hospital Course: Patient with PMH of T1DM on insulin  pump, HTN, alcohol dependence, HIV, positive presented to the hospital with complaints of high sugars. Has severe hyperglycemia without DKA which was treated with IV insulin  blood sugars now stable. Also acute delirium which per wife has been ongoing since July 2025 and has been waxing and waning since admission.  Assessment and plan. Hyperglycemia. Type 1 diabetes mellitus dependent on insulin  pump. Uncontrolled with hyperglycemia with long-term neuropathy. Patient is on insulin  pump at home. When not using his pump patient uses 25 units of long-acting insulin . Patient with blood sugar more than 400 on admission. Treated with IV insulin  and IV fluid. Switch to basal bolus regimen with Semglee  of 20 units only but this was stopped due to hypoglycemia. Will resume low dose basal insulin  today. On 12/12 patient remains drowsy and therefore with poor p.o. intake and developed hypoglycemic episodes. On 12/13 per RN patient eating much better had 3 meals. Changing back to every 4 hours sliding scale for now. Hemoglobin A1c is 8.8. Patient has a scheduled appointment with endocrine outpatient on 12/15.   Procalcitonin, urinalysis as well as blood culture performed to identify possible etiology of hyperglycemia which is negative. COVID-negative.  Influenza negative.  RSV negative.  Acute metabolic encephalopathy. Acute on chronic chronic delirium in the setting of AKI and hyperglycemia. Patient is on gabapentin , Zoloft  and Seroquel  at home. Seroquel  BID was resumed on the day of the admission patient remained agitated that day. Home regimen of gabapentin  and Zoloft  were resumed on 12/12. Patient remains drowsy and therefore currently Zoloft  discontinued again. Gabapentin  resumed  and patient has been less drowsy. Metabolic workup is unremarkable. CT scan of the head unremarkable as well for acute abnormality. On 12/13 agitated again in the morning.  Requiring restraints and IV Haldol .  Will monitor closely in the stepdown unit.  Resumed his gabapentin  at a lower dose. Had a good day yesterday at times but was also agitated and locked himself in the bathroom at one point. Required soft restraint and Haldol  overnight.   AKI. Takes lisinopril  at home. Likely AKI in the setting of dehydration in the setting of hyperglycemia. Improved. Monitor.   Hyperkalemia. Mild.  Corrected.   HTN. Now with hypotension. Blood pressure stable in supine. Upon orthostatic blood pressure drops. Takes Coreg  and lisinopril  at home. Currently on hold. Due to orthostatic will cont IV fluid bolus.   GERD. Continuing PPI twice daily.   Mood disorder. Home regimen includes Ativan , Seroquel , Zoloft . As above   Diabetic neuropathy. Gabapentin .  Resumed 12/13 at low dose.   Anemia. Chronic. Etiology not clear.  No active bleeding. For now monitor.  Underweight (BMI) of less than 18.5  Body mass index is 18.31 kg/m.  Placing the patient at a high risk for outcome.   Subjective: Sleeping this AM. Per RN required soft restraint and Haldol  overnight.  This AM easily arousable, following commands and answers orientation questions appropriately.  Physical Exam: Sleeping and wakes up easily. Lungs clear to auscultation. S1-S2 present present bowel sound present Speech slurred at his baseline. No focal deficit, moving all extremities.  Data Reviewed: I have Reviewed nursing notes, Vitals, and Lab results. Since last encounter, pertinent lab results CBC and BMP   . I have ordered test including CBC and BMP  .   Disposition: Status is: Inpatient Remains  inpatient appropriate because: Monitor for improvement in mentation and stability of blood glucose levels  heparin   injection 5,000 Units Start: 03/13/24 1400   Family Communication: No one at bedside Level of care: Stepdown   Vitals:   03/16/24 0300 03/16/24 0400 03/16/24 0500 03/16/24 0600  BP: 128/64 130/74 137/71 (!) 94/52  Pulse: 81 79    Resp: 14 18 17 19   Temp: 98 F (36.7 C)     TempSrc: Oral     SpO2: 100% 100%    Weight:      Height:         Author: Pete Schnitzer CHRISTELLA Gail, MD 03/16/2024 7:38 AM  Please look on [email or web address] to find out who is on call.

## 2024-03-17 DIAGNOSIS — G9340 Encephalopathy, unspecified: Secondary | ICD-10-CM | POA: Diagnosis present

## 2024-03-17 LAB — CBC
HCT: 30 % — ABNORMAL LOW (ref 39.0–52.0)
Hemoglobin: 9.8 g/dL — ABNORMAL LOW (ref 13.0–17.0)
MCH: 30.3 pg (ref 26.0–34.0)
MCHC: 32.7 g/dL (ref 30.0–36.0)
MCV: 92.9 fL (ref 80.0–100.0)
Platelets: 226 K/uL (ref 150–400)
RBC: 3.23 MIL/uL — ABNORMAL LOW (ref 4.22–5.81)
RDW: 13.3 % (ref 11.5–15.5)
WBC: 5.2 K/uL (ref 4.0–10.5)
nRBC: 0 % (ref 0.0–0.2)

## 2024-03-17 LAB — BASIC METABOLIC PANEL WITH GFR
Anion gap: 11 (ref 5–15)
BUN: 22 mg/dL — ABNORMAL HIGH (ref 6–20)
CO2: 22 mmol/L (ref 22–32)
Calcium: 9.9 mg/dL (ref 8.9–10.3)
Chloride: 100 mmol/L (ref 98–111)
Creatinine, Ser: 1.14 mg/dL (ref 0.61–1.24)
GFR, Estimated: >60 mL/min (ref >60)
Glucose, Bld: 259 mg/dL — ABNORMAL HIGH (ref 70–99)
Potassium: 4.6 mmol/L (ref 3.5–5.1)
Sodium: 132 mmol/L — ABNORMAL LOW (ref 135–145)

## 2024-03-17 LAB — GLUCOSE, CAPILLARY
Glucose-Capillary: 282 mg/dL — ABNORMAL HIGH (ref 70–99)
Glucose-Capillary: 247 mg/dL — ABNORMAL HIGH (ref 70–99)
Glucose-Capillary: 165 mg/dL — ABNORMAL HIGH (ref 70–99)
Glucose-Capillary: 224 mg/dL — ABNORMAL HIGH (ref 70–99)
Glucose-Capillary: 346 mg/dL — ABNORMAL HIGH (ref 70–99)

## 2024-03-17 MED ORDER — INSULIN ASPART 100 UNIT/ML IJ SOLN
3.0000 [IU] | Freq: Three times a day (TID) | INTRAMUSCULAR | Status: DC
  Administered 2024-03-17 – 2024-03-19 (×3): 3 [IU] via SUBCUTANEOUS
  Filled 2024-03-17 (×3): qty 3

## 2024-03-17 MED ORDER — LAMIVUDINE 150 MG PO TABS
300.0000 mg | ORAL_TABLET | Freq: Every day | ORAL | Status: DC
  Administered 2024-03-17 – 2024-03-25 (×8): 300 mg via ORAL
  Filled 2024-03-17 (×9): qty 2

## 2024-03-17 MED ORDER — DOLUTEGRAVIR SODIUM 50 MG PO TABS
50.0000 mg | ORAL_TABLET | Freq: Every day | ORAL | Status: DC
  Administered 2024-03-17 – 2024-03-25 (×8): 50 mg via ORAL
  Filled 2024-03-17 (×9): qty 1

## 2024-03-17 MED ORDER — INSULIN GLARGINE 100 UNIT/ML ~~LOC~~ SOLN
8.0000 [IU] | Freq: Every day | SUBCUTANEOUS | Status: DC
  Administered 2024-03-17 – 2024-03-19 (×3): 8 [IU] via SUBCUTANEOUS
  Filled 2024-03-17 (×4): qty 0.08

## 2024-03-17 NOTE — Inpatient Diabetes Management (Signed)
 Inpatient Diabetes Program Recommendations  AACE/ADA: New Consensus Statement on Inpatient Glycemic Control (2015)  Target Ranges:  Prepandial:   less than 140 mg/dL      Peak postprandial:   less than 180 mg/dL (1-2 hours)      Critically ill patients:  140 - 180 mg/dL   Lab Results  Component Value Date   GLUCAP 165 (H) 03/17/2024   HGBA1C 8.8 (H) 03/13/2024    Review of Glycemic Control  Diabetes history: DM1(does not make insulin .  Needs correction, basal and meal coverage)  Outpatient Diabetes medications: iLet Beta Bionic insulin  pump, started back on 02/27/2024. Humalog  in pump, Dexcom G7  Current orders for Inpatient glycemic control: Semglee  7 units QD, Novolog  0-9 units Q4H  Inpatient Diabetes Program Recommendations:    Please consider:  Novolog  0-9 units TID and 0-5 units at bedtime Novolog  3 units TID with meals if he consumes at least 50%  Thank you, Wyvonna Pinal, MSN, CDCES Diabetes Coordinator Inpatient Diabetes Program [phone] (team pager from 8a-5p)\

## 2024-03-17 NOTE — Progress Notes (Signed)
 Physical Therapy Treatment Patient Details Name: Carl Reed MRN: [id number] DOB: 10/10/1965 Today's Date: 03/17/2024   History of Present Illness Patient is a 58 y/o male admitted 03/12/24 due to significant hyperglycemia and delirium due to encephalopathy.  PMH positive for  DM on insulin  pump, HTN, ETOH dependence, HIV.    PT Comments   Patient resting in bed, noted food from tray, spread across the table. Patient  stated  I  forgot how to eat. Assisted patient with several bites, then patient able to progress to feed self with spoon and fork. Patient very slow to respond, does not answer orientation  questions.  No family present.  Patient unsteady with transfers standing at [facility].  Multimodal cues to initiate stepping to recliner hand over hand to use RW, frequent cueing.  Patient currently will require 24/7, does not appear to be able to mange in the home with 2 levels.  Patient may benefit from post acute rehab/snf as his cognition is still quite impaired as well as his  safety and physical status. Patient will benefit from continued inpatient follow up therapy, <3 hours/day.    If plan is discharge home, recommend the following: A little help with walking and/or transfers;A little help with bathing/dressing/bathroom;Help with stairs or ramp for entrance;Assistance with cooking/housework;Assist for transportation   Can travel by private vehicle        Equipment Recommendations  None recommended by PT    Recommendations for Other Services       Precautions / Restrictions Precautions Precautions: Fall Precaution/Restrictions Comments: watch BP, safety, needs l;ots of cues Restrictions Weight Bearing Restrictions Per Provider Order: No     Mobility  Bed Mobility Overal bed mobility: Needs Assistance Bed Mobility: Supine to Sit Rolling: Contact guard assist         General bed mobility comments: extra time, multimodal cues  for initiating activity     Transfers Overall transfer level: Needs assistance Equipment used: Rolling walker (2 wheels) Transfers: Sit to/from Stand, Bed to chair/wheelchair/BSC             General transfer comment: assisted to stand , multimodal cues to reach for RW, to step to  recliner, to  back up to recliner, step by step.    Ambulation/Gait                   Stairs             Wheelchair Mobility     Tilt Bed    Modified Rankin (Stroke Patients Only)       Balance Overall balance assessment: Needs assistance   Sitting balance-Leahy Scale: Fair     Standing balance support: Reliant on assistive device for balance, During functional activity, Bilateral upper extremity supported Standing balance-Leahy Scale: Poor                              Communication Communication Communication: Impaired Factors Affecting Communication: Difficulty expressing self;Reduced clarity of speech  Cognition Arousal: Alert Behavior During Therapy: Flat affect   PT - Cognitive impairments: Orientation, Initiation, Attention, Awareness, Safety/Judgement                       PT - Cognition Comments: does not answer orientation except Bd, stated that he forgot how to eat. Asked to be fed, then able to feed self. Following commands: Impaired Following commands impaired: Follows one step commands inconsistently  Cueing Cueing Techniques: Verbal cues, Gestural cues, Tactile cues  Exercises      General Comments        Pertinent Vitals/Pain Pain Assessment Faces Pain Scale: No hurt    Home Living                          Prior Function            PT Goals (current goals can now be found in the care plan section) Progress towards PT goals: Progressing toward goals    Frequency    Min 3X/week      PT Plan      Co-evaluation              AM-PAC PT 6 Clicks Mobility   Outcome Measure  Help needed turning from your back to  your side while in a flat bed without using bedrails?: A Little Help needed moving from lying on your back to sitting on the side of a flat bed without using bedrails?: A Little Help needed moving to and from a bed to a chair (including a wheelchair)?: A Lot Help needed standing up from a chair using your arms (e.g., wheelchair or bedside chair)?: A Lot Help needed to walk in hospital room?: A Lot Help needed climbing 3-5 steps with a railing? : Total 6 Click Score: 13    End of Session Equipment Utilized During Treatment: Gait belt Activity Tolerance: Patient tolerated treatment well Patient left: in chair;with call bell/phone within reach;with chair alarm set Nurse Communication: Mobility status;Other (comment) PT Visit Diagnosis: Other abnormalities of gait and mobility (R26.89)     Time: 8490-8450 PT Time Calculation (min) (ACUTE ONLY): 40 min  Charges:    $Therapeutic Activity: 23-37 mins $Self Care/Home Management: 8-22 PT General Charges $$ ACUTE PT VISIT: 1 Visit                     Darice Potters PT Acute Rehabilitation Services Office [phone]    Potters Darice Norris 03/17/2024, 4:17 PM

## 2024-03-17 NOTE — Plan of Care (Signed)
°  Problem: Fluid Volume: Goal: Ability to maintain a balanced intake and output will improve Outcome: Progressing   Problem: Metabolic: Goal: Ability to maintain appropriate glucose levels will improve Outcome: Progressing   Problem: Nutritional: Goal: Maintenance of adequate nutrition will improve Outcome: Progressing   Problem: Skin Integrity: Goal: Risk for impaired skin integrity will decrease Outcome: Progressing   Problem: Tissue Perfusion: Goal: Adequacy of tissue perfusion will improve Outcome: Progressing   Problem: Cardiac: Goal: Ability to maintain an adequate cardiac output will improve Outcome: Progressing   Problem: Respiratory: Goal: Will regain and/or maintain adequate ventilation Outcome: Progressing   Problem: Clinical Measurements: Goal: Ability to maintain clinical measurements within normal limits will improve Outcome: Progressing

## 2024-03-17 NOTE — Progress Notes (Signed)
 [facility] Progress Note Patient: Carl Reed FMW:[id number] DOB: 12-10-1965  DOA: 03/12/2024 DOS: the patient was seen and examined on 03/17/2024  Brief Hospital Course: Patient with PMH of T1DM on insulin  pump, HTN, alcohol dependence, HIV, positive presented to the hospital with complaints of high sugars. Has severe hyperglycemia without DKA which was treated with IV insulin  blood sugars now stable. Also acute delirium which per wife has been ongoing since July 2025 and has been waxing and waning since admission.  Assessment and plan. Hyperglycemia. Type 1 diabetes mellitus dependent on insulin  pump. Uncontrolled with hyperglycemia with long-term neuropathy. Patient is on insulin  pump at home. When not using his pump patient uses 25 units of long-acting insulin . Patient with blood sugar more than 400 on admission. Treated with IV insulin  and IV fluid. Switch to basal bolus regimen with Semglee  of 20 units only but this was stopped due to hypoglycemia. Basal insulin  resumed 12/14 and slowly uptitrating On 12/12 patient remains drowsy and therefore with poor p.o. intake and developed hypoglycemic episodes. On 12/13 per RN patient eating much better had 3 meals. Changing back to every 4 hours sliding scale for now, blood sugars are rising. Hemoglobin A1c is 8.8. Patient has a scheduled appointment with endocrine outpatient on 12/15.   Procalcitonin, urinalysis as well as blood culture performed to identify possible etiology of hyperglycemia which is negative. COVID-negative.  Influenza negative.  RSV negative.  Acute metabolic encephalopathy. Acute on chronic chronic delirium in the setting of AKI and hyperglycemia. Patient is on gabapentin , Zoloft  and Seroquel  at home. Seroquel  BID was resumed on the day of the admission patient remained agitated that day. Home regimen of gabapentin  and Zoloft  were resumed on 12/12. Patient remains drowsy and therefore currently Zoloft   discontinued again. Gabapentin  resumed and patient has been less drowsy. Metabolic workup is unremarkable. CT scan of the head unremarkable as well for acute abnormality. On 12/13 agitated again in the morning.  Requiring restraints and IV Haldol .  Will monitor closely in the stepdown unit.  Resumed his gabapentin  at a lower dose. Had a good day at times but was also agitated and locked himself in the bathroom at one point. Required soft restraint and Haldol  overnight. 12/14 had a good day overall has been calm and ambulated to the bathroom.  12/15 lap belt removed, he remains more calm and transferring to med surg floor   AKI. Takes lisinopril  at home. Likely AKI in the setting of dehydration in the setting of hyperglycemia. Improved. Monitor.   Hyperkalemia. Mild.  Corrected.   HTN. Now with hypotension. Blood pressure stable in supine. Upon orthostatic blood pressure drops. Takes Coreg  and lisinopril  at home. Currently on hold. Due to orthostatic got IV fluid bolus but off fluids now. May need to restart home meds soon.   GERD. Continuing PPI twice daily.   Mood disorder. Home regimen includes Ativan , Seroquel , Zoloft . As above   Diabetic neuropathy. Gabapentin .  Resumed 12/13 at low dose.   Anemia. Chronic. Etiology not clear.  No active bleeding. For now monitor.  Underweight (BMI) of less than 18.5  Body mass index is 18.31 kg/m.  Placing the patient at a high risk for outcome.   Subjective:  Seen this AM with RN, staying awake and more calm but not talking.  Physical Exam: Awake and calm today, unclear if oriented or not.  Lungs clear to auscultation. S1-S2 present present bowel sound present Speech slurred at his baseline. No focal deficit, moving all extremities.  Data Reviewed:  I have Reviewed nursing notes, Vitals, and Lab results. Since last encounter, pertinent lab results CBC and BMP   . I have ordered test including CBC and BMP  .    Disposition: Status is: Inpatient Remains inpatient appropriate because:  Monitor for improvement in mentation and stability of blood glucose levels  heparin  injection 5,000 Units Start: 03/13/24 1400   Family Communication: No one at bedside  Level of care: Med-Surg   Vitals:   03/17/24 0600 03/17/24 0700 03/17/24 0800 03/17/24 0824  BP: 99/63 101/60 128/76   Pulse: 82 78 89 77  Resp: 18 14 18 15   Temp:    (!) 96.7 F (35.9 C)  TempSrc:    Axillary  SpO2: 98% 99% 99% 100%  Weight:      Height:         Author: Latesia Norrington CHRISTELLA Gail, MD 03/17/2024 9:42 AM  Please look on [email or web address] to find out who is on call.

## 2024-03-17 NOTE — TOC Initial Note (Signed)
 Transition of Care [facility]) - Initial/Assessment Note    Patient Details  Name: Carl Reed MRN: [id number] Date of Birth: 1966-03-09  [facility] CM/SW Contact:    Jon ONEIDA Anon, RN Phone Number: 03/17/2024, 3:11 PM  Clinical Narrative:                 Pt is from home. Pt alert to self at this time. PT evaluated and recommending HH PT. RNCM sent out referrals for [facility] services at discharge, awaiting any acceptances.  Pt not medically ready for discharge. ICM will continue to follow for DC planning needs.     Expected Discharge Plan:  (TBD) Barriers to Discharge: Continued Medical Work up   Patient Goals and CMS Choice Patient states their goals for this hospitalization and ongoing recovery are:: Return home CMS Medicare.gov Compare Post Acute Care list provided to:: Other (Comment Required) (NA) Choice offered to / list presented to : NA [place] ownership interest in [facility].provided to:: Parent NA    Expected Discharge Plan and Services In-house Referral: NA Discharge Planning Services: CM Consult   Living arrangements for the past 2 months: Single Family Home                 DME Arranged: N/A DME Agency: NA       HH Arranged:  (Referral sent out, waiting on any acceptances)          Prior Living Arrangements/Services Living arrangements for the past 2 months: Single Family Home Lives with:: Spouse Patient language and need for interpreter reviewed:: Yes Do you feel safe going back to the place where you live?: Yes      Need for Family Participation in Patient Care: Yes (Comment) Care giver support system in place?: Yes (comment) Current home services: Other (comment) (None) Criminal Activity/Legal Involvement Pertinent to Current Situation/Hospitalization: No - Comment as needed  Activities of Daily Living   ADL Screening (condition at time of admission) Independently performs ADLs?: No Does the patient have a NEW difficulty  with bathing/dressing/toileting/self-feeding that is expected to last >3 days?: No Does the patient have a NEW difficulty with getting in/out of bed, walking, or climbing stairs that is expected to last >3 days?: No Does the patient have a NEW difficulty with communication that is expected to last >3 days?: No Is the patient deaf or have difficulty hearing?: No Does the patient have difficulty seeing, even when wearing glasses/contacts?: No Does the patient have difficulty concentrating, remembering, or making decisions?: Yes  Permission Sought/Granted Permission sought to share information with : Family Supports    Share Information with NAME: Selph, [facility]  Spouse, Emergency Contact  [phone]           Emotional Assessment Appearance:: Other (Comment Required (UTA) Attitude/Demeanor/Rapport: Unable to Assess Affect (typically observed): Unable to Assess Orientation: : Oriented to Self Alcohol / Substance Use: Not Applicable Psych Involvement: No (comment)  Admission diagnosis:  Hyperkalemia [E87.5] Hyperglycemia [R73.9] AKI (acute kidney injury) [N17.9] AMS (altered mental status) [R41.82] Encephalopathy [G93.40] Patient Active Problem List   Diagnosis Date Noted   Encephalopathy 03/17/2024   AMS (altered mental status) 03/13/2024   Macrocytic anemia 04/26/2023   Tobacco use disorder 04/26/2023   Alcohol abuse 04/26/2023   Abnormal LFTs 10/10/2022   AKI (acute kidney injury) 10/10/2022   Hypomagnesemia 06/07/2022   Avascular necrosis of bone of hip, left (HCC) 06/06/2022   DKA (diabetic ketoacidosis) (HCC) 03/27/2020   Diabetes (HCC) 03/27/2020  HIV (human immunodeficiency virus infection) (HCC) 03/27/2020   GERD (gastroesophageal reflux disease) 03/27/2020   Hx of dizziness 03/27/2020   Closed displaced fracture of proximal phalanx of left little finger with routine healing 05/08/2019   Alcoholism (HCC) 06/18/2015   Diabetic polyneuropathy associated with type 1  diabetes mellitus (HCC) 12/25/2013   Essential hypertension 02/17/2013   PCP:  Beverlie Crazier, PA Pharmacy:   [id number] - [place], [place] - [address] [place] [address] Phone: [phone] Fax: [phone]     Social Drivers of Health (SDOH) Social History: SDOH Screenings   Food Insecurity: Low Risk (03/11/2024)   Received from [facility]  Housing: Low Risk (03/11/2024)   Received from [facility]  Transportation Needs: No Transportation Needs (03/11/2024)   Received from [facility]  Utilities: Low Risk (03/11/2024)   Received from [facility]  Financial Resource Strain: Low Risk  (10/29/2023)   Received from [facility]  Social Connections: Moderately Integrated (10/29/2023)   Received from [facility]  Stress: Patient Unable To Answer (11/28/2023)   Received from [facility]  Tobacco Use: High Risk (03/12/2024)   SDOH Interventions:     Readmission Risk Interventions    03/17/2024    3:06 PM 10/10/2022    2:37 PM  Readmission Risk Prevention Plan  Post Dischage Appt  Complete  Medication Screening  Complete  Transportation Screening Complete Complete  PCP or Specialist Appt within 5-7 Days Complete   Home Care Screening Complete   Medication Review (RN CM) Complete

## 2024-03-18 DIAGNOSIS — G9340 Encephalopathy, unspecified: Secondary | ICD-10-CM | POA: Diagnosis not present

## 2024-03-18 LAB — CULTURE, BLOOD (ROUTINE X 2)
Culture: NO GROWTH
Culture: NO GROWTH
Special Requests: ADEQUATE

## 2024-03-18 LAB — BASIC METABOLIC PANEL WITH GFR
Anion gap: 12 (ref 5–15)
BUN: 30 mg/dL — ABNORMAL HIGH (ref 6–20)
CO2: 27 mmol/L (ref 22–32)
Calcium: 10.6 mg/dL — ABNORMAL HIGH (ref 8.9–10.3)
Chloride: 100 mmol/L (ref 98–111)
Creatinine, Ser: 1.25 mg/dL — ABNORMAL HIGH (ref 0.61–1.24)
GFR, Estimated: >60 mL/min (ref >60)
Glucose, Bld: 128 mg/dL — ABNORMAL HIGH (ref 70–99)
Potassium: 4.3 mmol/L (ref 3.5–5.1)
Sodium: 139 mmol/L (ref 135–145)

## 2024-03-18 LAB — CBC
HCT: 31.7 % — ABNORMAL LOW (ref 39.0–52.0)
Hemoglobin: 10.4 g/dL — ABNORMAL LOW (ref 13.0–17.0)
MCH: 30.3 pg (ref 26.0–34.0)
MCHC: 32.8 g/dL (ref 30.0–36.0)
MCV: 92.4 fL (ref 80.0–100.0)
Platelets: 276 K/uL (ref 150–400)
RBC: 3.43 MIL/uL — ABNORMAL LOW (ref 4.22–5.81)
RDW: 13.2 % (ref 11.5–15.5)
WBC: 6.1 K/uL (ref 4.0–10.5)
nRBC: 0 % (ref 0.0–0.2)

## 2024-03-18 LAB — GLUCOSE, CAPILLARY
Glucose-Capillary: 527 mg/dL (ref 70–99)
Glucose-Capillary: 164 mg/dL — ABNORMAL HIGH (ref 70–99)
Glucose-Capillary: 209 mg/dL — ABNORMAL HIGH (ref 70–99)
Glucose-Capillary: 234 mg/dL — ABNORMAL HIGH (ref 70–99)
Glucose-Capillary: 152 mg/dL — ABNORMAL HIGH (ref 70–99)
Glucose-Capillary: 293 mg/dL — ABNORMAL HIGH (ref 70–99)

## 2024-03-18 MED ORDER — SODIUM CHLORIDE 0.9 % IV SOLN: INTRAVENOUS | Status: DC

## 2024-03-18 NOTE — Progress Notes (Signed)
 Patient refused all oral medications, cbg check in PM,; refusing food and drink today; verbally aggressive; waist restraint remains in place; patient is alert to self only; patient was allowed to speak with wife today but without reorientation; spouse states that patient has some good days and some bad days; patient is resting in bed; will continue to monitor

## 2024-03-18 NOTE — Inpatient Diabetes Management (Signed)
 Inpatient Diabetes Program Recommendations  AACE/ADA: New Consensus Statement on Inpatient Glycemic Control (2015)  Target Ranges:  Prepandial:   less than 140 mg/dL      Peak postprandial:   less than 180 mg/dL (1-2 hours)      Critically ill patients:  140 - 180 mg/dL   Lab Results  Component Value Date   GLUCAP 234 (H) 03/18/2024   HGBA1C 8.8 (H) 03/13/2024    Review of Glycemic Control  Latest Reference Range & Units 03/17/24 12:19 03/17/24 16:31 03/17/24 21:30 03/18/24 01:08 03/18/24 04:20 03/18/24 07:53 03/18/24 11:27  Glucose-Capillary 70 - 99 mg/dL 834 (H) 775 (H) 653 (H) 527 (HH) 164 (H) 209 (H) 234 (H)  (HH): Data is critically high (H): Data is abnormally high   Inpatient Diabetes Program Recommendations:    Please consider:  Lantus  14 units every day (could give additional 6 units now as he has already had 8 units today).  Thank you, Wyvonna Pinal, MSN, CDCES Diabetes Coordinator Inpatient Diabetes Program [phone] (team pager from 8a-5p)

## 2024-03-18 NOTE — Consult Note (Cosign Needed Addendum)
°  Psychiatry reviewed the chart at the request of the primary team for guidance regarding ongoing altered mental status in the setting of hypoglycemic encephalopathy. The patient was not seen or examined by Psychiatry. 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  From a Psychiatry perspective, we agree with the assessment that this represents a chronic and ongoing condition rather than an acute, reversible delirium. 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 In this context, alternating periods of somnolence and agitation--sometimes requiring restraints--are expected manifestations of impaired cortical regulation rather than untreated psychiatric illness. There are no alternate psychiatric medication regimens that would meaningfully reverse this trajectory; management is largely supportive and symptom-based. Resuming sertraline  (Zoloft ) is reasonable for baseline mood regulation, though expectations should remain modest. We support the primary teams focus on safety, minimizing restraints when possible, and continued goals-of-care discussions, as psychiatric interventions are unlikely to substantially alter the patients neurologic course.  From a psychiatric standpoint, it is reasonable to resume home psychiatric medications, including sertraline  (Zoloft ), as previously prescribed. Intermittent agitation is expected given the extent of neurologic injury. Psychiatric interventions are anticipated to provide minimal benefit in altering the  patients cognitive trajectory.  Recommendations include continued supportive care,  *Use of nonpharmacologic strategies for agitation when possible *Ongoing involvement of PT/OT/SLP for neurorehabilitation and functional support, with realistic expectations regarding recovery.  *If agitation continues team can consider Depakote 250mg  po BID, with a target level of (40-60).   Psychiatry will sign off. Re-consult if new or clearly defined psychiatric concerns arise.

## 2024-03-18 NOTE — Plan of Care (Signed)
  Problem: Coping: Goal: Ability to adjust to condition or change in health will improve Outcome: Not Progressing   Problem: Health Behavior/Discharge Planning: Goal: Ability to identify and utilize available resources and services will improve Outcome: Progressing Goal: Ability to manage health-related needs will improve Outcome: Progressing

## 2024-03-18 NOTE — Progress Notes (Signed)
 [facility] Progress Note Patient: Carl Reed FMW:[id number] DOB: Nov 24, 1965  DOA: 03/12/2024 DOS: the patient was seen and examined on 03/18/2024  Brief Hospital Course: Patient with PMH of T1DM on insulin  pump, HTN, alcohol dependence, HIV, positive presented to the hospital with complaints of high sugars. Has severe hyperglycemia without DKA which was treated with IV insulin  blood sugars now stable. Also acute delirium which per wife has been ongoing since July 2025 and has been waxing and waning since admission.  He had a very good day 12/14 into 12/15, but this morning on 12/16 he was agitated and was placed back and waist belt.  Assessment and plan. Hyperglycemia. Type 1 diabetes mellitus dependent on insulin  pump. Uncontrolled with hyperglycemia with long-term neuropathy. Patient is on insulin  pump at home. When not using his pump patient uses 25 units of long-acting insulin . Patient with blood sugar more than 400 on admission. Treated with IV insulin  and IV fluid. Switch to basal bolus regimen with Semglee  of 20 units only but this was stopped due to hypoglycemia. Basal insulin  resumed 12/14 and slowly uptitrating On 12/12 patient remains drowsy and therefore with poor p.o. intake and developed hypoglycemic episodes. On 12/13 per RN patient eating much better had 3 meals. Hemoglobin A1c is 8.8. Patient has a scheduled appointment with endocrine outpatient on 12/15.   Procalcitonin, urinalysis as well as blood culture performed to identify possible etiology of hyperglycemia which is negative. COVID-negative.  Influenza negative.  RSV negative. Currently on Lantus  8 units daily, and 3 units with meals as well as sensitive sliding scale  Acute metabolic encephalopathy. Acute on chronic chronic delirium in the setting of AKI and hyperglycemia. Patient is on gabapentin , Zoloft  and Seroquel  at home. Seroquel  BID was resumed on the day of the admission patient remained agitated  that day. Home regimen of gabapentin  and Zoloft  were resumed on 12/12. Patient remains drowsy and therefore currently Zoloft  discontinued again. Gabapentin  resumed and patient has been less drowsy. Metabolic workup is unremarkable. CT scan of the head unremarkable as well for acute abnormality. On 12/13 agitated again in the morning.  Requiring restraints and IV Haldol .  Will monitor closely in the stepdown unit.  Resumed his gabapentin  at a lower dose. Had a good day at times but was also agitated and locked himself in the bathroom at one point. Required soft restraint and Haldol  overnight. 12/14 had a good day overall has been calm and ambulated to the bathroom.  12/15 lap belt removed, he remains more calm and transferring to med surg floor 12/16 agitated again in the morning, waist belt replaced.  Will consult psych today for any additional recommendations.   AKI. Takes lisinopril  at home. Likely AKI in the setting of dehydration in the setting of hyperglycemia. Improved initially but creatinine rising gradually, not on any nephrotoxins. 12/16 will start some gentle IV fluids today   Hyperkalemia. Mild.  Corrected.   HTN. Now with hypotension. Blood pressure stable in supine. Upon orthostatic blood pressure drops. Takes Coreg  and lisinopril  at home. Currently on hold. Due to orthostatic got IV fluid bolus but off fluids now. May need to restart home meds soon.   GERD. Continuing PPI twice daily.   Mood disorder. Home regimen includes Ativan , Seroquel , Zoloft . As above   Diabetic neuropathy. Gabapentin .  Resumed 12/13 at low dose.   Anemia. Chronic. Etiology not clear.  No active bleeding. For now monitor.  Underweight (BMI) of less than 18.5  Body mass index is 18.31 kg/m.  Placing  the patient at a high risk for outcome.   Subjective:  Seen this AM with RN, staying awake and more calm but not talking.  Physical Exam: Awake and calm today, unclear if oriented  or not.  Not speaking today like he was the day before. Lungs clear to auscultation. S1-S2 present present bowel sound present Speech slurred at his baseline. Wearing a waist belt. No focal deficit, moving all extremities.  Data Reviewed: I have Reviewed nursing notes, Vitals, and Lab results. Since last encounter, pertinent lab results CBC and BMP   . I have ordered test including CBC and BMP  .   Disposition: Status is: Inpatient Remains inpatient appropriate because:  Monitor for improvement in mentation and stability of blood glucose levels  heparin  injection 5,000 Units Start: 03/13/24 1400   Family Communication: No one at bedside  Level of care: Med-Surg   Vitals:   03/17/24 0824 03/17/24 1330 03/17/24 2127 03/18/24 0400  BP:  97/69 (!) 164/89 114/72  Pulse: 77 73 88 91  Resp: 15 16 18 18   Temp: (!) 96.7 F (35.9 C) 97.6 F (36.4 C) 98 F (36.7 C) 98 F (36.7 C)  TempSrc: Axillary Oral Oral Oral  SpO2: 100% 100% 100% 100%  Weight:      Height:         Author: Talik Casique CHRISTELLA Gail, MD 03/18/2024 10:34 AM  Please look on [email or web address] to find out who is on call.

## 2024-03-19 DIAGNOSIS — R410 Disorientation, unspecified: Secondary | ICD-10-CM | POA: Diagnosis not present

## 2024-03-19 LAB — BASIC METABOLIC PANEL WITH GFR
Anion gap: 10 (ref 5–15)
BUN: 18 mg/dL (ref 6–20)
CO2: 25 mmol/L (ref 22–32)
Calcium: 10.3 mg/dL (ref 8.9–10.3)
Chloride: 104 mmol/L (ref 98–111)
Creatinine, Ser: 0.84 mg/dL (ref 0.61–1.24)
GFR, Estimated: >60 mL/min (ref >60)
Glucose, Bld: 89 mg/dL (ref 70–99)
Potassium: 3.9 mmol/L (ref 3.5–5.1)
Sodium: 139 mmol/L (ref 135–145)

## 2024-03-19 LAB — CBC
HCT: 31.4 % — ABNORMAL LOW (ref 39.0–52.0)
Hemoglobin: 10.1 g/dL — ABNORMAL LOW (ref 13.0–17.0)
MCH: 30 pg (ref 26.0–34.0)
MCHC: 32.2 g/dL (ref 30.0–36.0)
MCV: 93.2 fL (ref 80.0–100.0)
Platelets: 276 K/uL (ref 150–400)
RBC: 3.37 MIL/uL — ABNORMAL LOW (ref 4.22–5.81)
RDW: 13.3 % (ref 11.5–15.5)
WBC: 3.9 K/uL — ABNORMAL LOW (ref 4.0–10.5)
nRBC: 0 % (ref 0.0–0.2)

## 2024-03-19 LAB — GLUCOSE, CAPILLARY
Glucose-Capillary: 240 mg/dL — ABNORMAL HIGH (ref 70–99)
Glucose-Capillary: 86 mg/dL (ref 70–99)
Glucose-Capillary: 118 mg/dL — ABNORMAL HIGH (ref 70–99)
Glucose-Capillary: 151 mg/dL — ABNORMAL HIGH (ref 70–99)
Glucose-Capillary: 362 mg/dL — ABNORMAL HIGH (ref 70–99)
Glucose-Capillary: 301 mg/dL — ABNORMAL HIGH (ref 70–99)
Glucose-Capillary: 134 mg/dL — ABNORMAL HIGH (ref 70–99)

## 2024-03-19 MED ORDER — INSULIN ASPART 100 UNIT/ML IJ SOLN
5.0000 [IU] | Freq: Three times a day (TID) | INTRAMUSCULAR | Status: DC
  Administered 2024-03-20 – 2024-03-25 (×11): 5 [IU] via SUBCUTANEOUS
  Filled 2024-03-19 (×12): qty 5

## 2024-03-19 MED ORDER — INSULIN ASPART 100 UNIT/ML IJ SOLN
0.0000 [IU] | Freq: Three times a day (TID) | INTRAMUSCULAR | Status: DC
  Administered 2024-03-19 – 2024-03-20 (×3): 7 [IU] via SUBCUTANEOUS
  Administered 2024-03-20: 09:00:00 9 [IU] via SUBCUTANEOUS
  Administered 2024-03-21: 7 [IU] via SUBCUTANEOUS
  Administered 2024-03-21: 3 [IU] via SUBCUTANEOUS
  Administered 2024-03-22: 7 [IU] via SUBCUTANEOUS
  Administered 2024-03-22: 9 [IU] via SUBCUTANEOUS
  Filled 2024-03-19 (×2): qty 7
  Filled 2024-03-19: qty 3
  Filled 2024-03-19 (×2): qty 7
  Filled 2024-03-19: qty 9

## 2024-03-19 NOTE — Plan of Care (Signed)

## 2024-03-19 NOTE — Progress Notes (Addendum)
 Occupational Therapy Treatment Patient Details Name: Carl Reed MRN: [id number] DOB: [birth year] Today's Date: 03/19/2024   History of present illness Patient is a 58 y/o male admitted 03/12/24 due to significant hyperglycemia and delirium due to encephalopathy.  PMH positive for  DM on insulin  pump, HTN, ETOH dependence, HIV.   OT comments  Met pt in room standing with NT as pt was exiting bed with waist restraint. Pt ambulated around unit with mod A to direct RW/prevent pt from running into walls. Pt became agitated once in room and needing to return to bed. Waist restraint applied and bed alarm set - nsg aware. Pt with significant cognitive deficits and  will benefit from continued inpatient follow up therapy, <3 hours/day.  Acute OT to continue to follow.       If plan is discharge home, recommend the following:  Help with stairs or ramp for entrance;Assistance with cooking/housework;Direct supervision/assist for medications management;Supervision due to cognitive status;Assistance with feeding;Direct supervision/assist for financial management;A lot of help with bathing/dressing/bathroom;A lot of help with walking and/or transfers;Assist for transportation   Equipment Recommendations  Other (comment) (TBA at next venue)    Recommendations for Other Services      Precautions / Restrictions Precautions Precautions: Fall Precaution/Restrictions Comments: becomes easily agitated       Mobility Bed Mobility Overal bed mobility: Needs Assistance             General bed mobility comments: attemptingto get OOB with retraints; physically lifted legs to return to supine due to cognition    Transfers Overall transfer level: Needs assistance Equipment used: Rolling walker (2 wheels) Transfers: Sit to/from Stand Sit to Stand: Min assist                 Balance     Sitting balance-Leahy Scale: Fair       Standing balance-Leahy Scale: Poor (R bias when walking)                              ADL either performed or assessed with clinical judgement   ADL Overall ADL's : Needs assistance/impaired                                       General ADL Comments: would not engage in functional tasks this session    Extremity/Trunk Assessment Upper Extremity Assessment Upper Extremity Assessment:  (rigidity  - using hands functionally)   Lower Extremity Assessment Lower Extremity Assessment: Defer to PT evaluation        Vision       Perception     Praxis     Communication Communication Communication: Impaired Factors Affecting Communication: Reduced clarity of speech   Cognition Arousal: Alert Behavior During Therapy: Restless, Agitated, Impulsive Cognition: Cognition impaired             OT - Cognition Comments: does not espond to formal questions; unaware of running into items, difficulty redirecting; became agitated once seated in bed adn needing to lay down. Poor awareness/understanding of need to lay down. Wanting to go down stairs to go outside                 Following commands: Impaired Following commands impaired: Follows one step commands inconsistently      Cueing   Cueing Techniques: Verbal cues, Tactile cues, Visual cues  Exercises  Shoulder Instructions       General Comments R bias; poor awareness of running into items on L and required mod A to pull way from L    Pertinent Vitals/ Pain       Pain Assessment Pain Assessment: Faces Faces Pain Scale: No hurt  Home Living                                          Prior Functioning/Environment              Frequency  Min 1X/week        Progress Toward Goals  OT Goals(current goals can now be found in the care plan section)  Progress towards OT goals: OT to reassess next treatment  Acute Rehab OT Goals OT Goal Formulation: Patient unable to participate in goal setting Time For Goal  Achievement: 03/28/24 Potential to Achieve Goals: Fair ADL Goals Pt Will Perform Grooming: with modified independence;standing Pt Will Perform Upper Body Dressing: with modified independence;sitting Pt Will Perform Lower Body Dressing: with modified independence;sit to/from stand Pt Will Transfer to Toilet: with modified independence;ambulating Pt Will Perform Toileting - Clothing Manipulation and hygiene: with modified independence;sit to/from stand  Plan      Co-evaluation                 AM-PAC OT 6 Clicks Daily Activity     Outcome Measure   Help from another person eating meals?: A Lot Help from another person taking care of personal grooming?: A Lot Help from another person toileting, which includes using toliet, bedpan, or urinal?: A Lot Help from another person bathing (including washing, rinsing, drying)?: A Lot Help from another person to put on and taking off regular upper body clothing?: A Lot Help from another person to put on and taking off regular lower body clothing?: Total 6 Click Score: 11    End of Session Equipment Utilized During Treatment: Gait belt;Rolling walker (2 wheels)  OT Visit Diagnosis: Muscle weakness (generalized) (M62.81);Feeding difficulties (R63.3);Other symptoms and signs involving cognitive function   Activity Tolerance Treatment limited secondary to agitation   Patient Left in bed;with call bell/phone within reach;with bed alarm set;with restraints reapplied   Nurse Communication Mobility status;Other (comment) (incresaed in agitation)        Time: 8549-8484 OT Time Calculation (min): 25 min  Charges: OT General Charges $OT Visit: 1 Visit OT Treatments $Self Care/Home Management : 23-37 mins  Kreg Sink, OT/L   Acute OT Clinical Specialist Acute Rehabilitation Services Pager [phone] Office [phone]   [facility] 03/19/2024, 3:27 PM

## 2024-03-19 NOTE — Progress Notes (Addendum)
 Physical Therapy Treatment Patient Details Name: Carl Reed MRN: [id number] DOB: Aug 13, 1965 Today's Date: 03/19/2024   History of Present Illness Patient is a 58 y/o male admitted 03/12/24 due to significant hyperglycemia and delirium due to encephalopathy.  PMH positive for  DM on insulin  pump, HTN, ETOH dependence, HIV.    PT Comments  PT - Cognition Comments: very slow to respond and move requiring repeat instructions.  Easily drifts off.  Mummbles a few words.  Confused to current location/situation.  Required MAX physical guidence for direction.  VERY soft spoken, difficult to understand.  NT taking vitals.  Glucose > 300.   Assisted OOB was difficult.  General bed mobility comments: required increased assist this session.  VERY slow to move.  Delayed.  Poor self functional iuse of B UE's to assist.  Rigid throughout. General transfer comment: required increased assist due to slow, delayed initiation.  Rigid throughout.  Assisted to rise from EOB by pulling B UE's as Pt offered no self assist to push up.  Poor flexed posture with severe posterior lean and NO self correction.  HIGH FALL RISK.  Also assisted with a toilet transfer twice.  Required Max Assist to control stand to sit due to rigidity.  Pt with tendency to plop uncontrolled.  Assisted with peri care after void/BM.  Pt was unable to grap a wash cloth functionally enough to even attempt to wash his face. General Gait Details: Pt required Mod to Max Assist with Therapist pushing walker forward to initate steppage gait.  Poor flexed posture and short shuffled steps.  Decreased gait speed.  Naroow BOS.  Poor self correction to midline balance.  Was able to amb a great distance however Therapist assisted with posterior balance, pushing Pt forward as well as pushing walker forward.  Trial amb without walker resulted in a near fall.  Increased instability with festination.  HIGH FALL RISK.  LPT has rec Pt will need ST Rehab at SNF to  address mobility and functional decline prior to safely returning home.    If plan is discharge home, recommend the following: A little help with walking and/or transfers;A little help with bathing/dressing/bathroom;Help with stairs or ramp for entrance;Assistance with cooking/housework;Assist for transportation   Can travel by private vehicle        Equipment Recommendations  None recommended by PT    Recommendations for Other Services       Precautions / Restrictions Precautions Precautions: Fall Precaution/Restrictions Comments: Kandace Restrictions Weight Bearing Restrictions Per Provider Order: No     Mobility  Bed Mobility Overal bed mobility: Needs Assistance Bed Mobility: Supine to Sit     Supine to sit: Mod assist, Max assist     General bed mobility comments: required increased assist this session.  VERY slow to move.  Delayed.  Poor self functional iuse of B UE's to assist.  Rigid throughout.    Transfers Overall transfer level: Needs assistance Equipment used: None, Rolling walker (2 wheels) Transfers: Sit to/from Stand, Bed to chair/wheelchair/BSC Sit to Stand: Mod assist, Max assist Stand pivot transfers: Max assist         General transfer comment: required increased assist due to slow, delayed initiation.  Rigid throughout.  Assisted to rise from EOB by pulling B UE's as Pt offered no self assist to push up.  Poor flexed posture with severe posterior lean and NO self correction.  HIGH FALL RISK.  Also assisted with a toilet transfer twice.  Required Max Assist to control stand  to sit due to rigidity.  Pt with tendency to plop uncontrolled.  Assisted with peri care after void/BM.  Pt was unable to grap a wash cloth functionally enough to even attempt to wash his face.    Ambulation/Gait Ambulation/Gait assistance: Mod assist, Max assist Gait Distance (Feet): 115 Feet Assistive device: 1 person hand held assist, Rolling walker (2 wheels) Gait  Pattern/deviations: Step-through pattern, Decreased stride length, Festinating, Shuffle, Trunk flexed, Narrow base of support Gait velocity: decreased     General Gait Details: Pt required Mod to Max Assist with Therapist pushing walker forward to initate steppage gait.  Poor flexed posture and short shuffled steps.  Decreased gait speed.  Naroow BOS.  Poor self correction to midline balance.  Was able to amb a great distance however Therapist assisted with posterior balance, pushing Pt forward as well as pushing walker forward.  Trial amb without walker resulted in a near fall.  Increased instability with festination.  HIGH FALL RISK.   Stairs             Wheelchair Mobility     Tilt Bed    Modified Rankin (Stroke Patients Only)       Balance                                            Communication Communication Communication: Impaired  Cognition Arousal: Alert, Lethargic, Obtunded Behavior During Therapy: Flat affect   PT - Cognitive impairments: Orientation, Initiation, Attention, Awareness, Safety/Judgement Difficult to assess due to: Level of arousal                     PT - Cognition Comments: very slow to respond and move requiring repeat instructions.  Easily drifts off.  Mummbles a few words.  Confused to current location/situation.  Required MAX physical guidence for direction.  VERY soft spoken, difficult to understand. Following commands: Impaired Following commands impaired: Follows one step commands inconsistently    Cueing Cueing Techniques: Verbal cues, Gestural cues, Tactile cues  Exercises      General Comments        Pertinent Vitals/Pain Pain Assessment Pain Assessment: No/denies pain    Home Living                          Prior Function            PT Goals (current goals can now be found in the care plan section) Progress towards PT goals: Progressing toward goals    Frequency    Min  3X/week      PT Plan      Co-evaluation              AM-PAC PT 6 Clicks Mobility   Outcome Measure  Help needed turning from your back to your side while in a flat bed without using bedrails?: A Lot Help needed moving from lying on your back to sitting on the side of a flat bed without using bedrails?: A Lot Help needed moving to and from a bed to a chair (including a wheelchair)?: A Lot Help needed standing up from a chair using your arms (e.g., wheelchair or bedside chair)?: A Lot Help needed to walk in hospital room?: A Lot Help needed climbing 3-5 steps with a railing? : Total 6 Click Score: 11  End of Session Equipment Utilized During Treatment: Gait belt   Patient left: in chair;with call bell/phone within reach (chair pad in recliner but no green alarm box present.  Alerted NT.)   PT Visit Diagnosis: Other abnormalities of gait and mobility (R26.89)     Time: 8845-8778 PT Time Calculation (min) (ACUTE ONLY): 27 min  Charges:    $Gait Training: 8-22 mins $Therapeutic Activity: 8-22 mins PT General Charges $$ ACUTE PT VISIT: 1 Visit                     Katheryn Leap  PTA Acute  Rehabilitation Services Office M-F          [phone]

## 2024-03-19 NOTE — Progress Notes (Signed)
 Pt is still restless following Seroquel  PO so RN ambulated with pt in hall . Daish (NT) also present to ensure pt does not fall  . SABRA SABRA Oas Nurse Melvyn Aware

## 2024-03-19 NOTE — Progress Notes (Signed)
 [facility] Progress Note  Patient: Carl Reed     FMW:[id number]  DOA: 03/12/2024   PCP: Beverlie Crazier, PA       Brief hospital course: This is a 58 y/o male with DM, HTN, cognitive delay who presents to the hospital for hyperglycemia. Hospital course complicated by worsening confusion.   Subjective:  No complaints.  Assessment and Plan: Principal Problem:  Hyperglycemia, DM type 1 with poly neuropathy  - resolved - feeding himself now and beginning to eat appropriately- - A1c is 8.8 - sugars are erratic in the hospital with highs of 300s - increase Novolog  insulin  to 5 U   Active Problems:  Confusion - has chronic cognitive issues and per records, apparently had a hypoglycemic episode that was felt to have caused cognitive injury back in 11/2023- he has had waxing and waning mentation since then - on my exam, he is very slow to respond to questions but is able to answer most simple questions appropriately - per wife, he is back to his baseline - will attempt to hold off on resumption of waist restraint - continue Seroquel  and Zoloft  - appreciate assistance from psych team   HIV - cont Tivicay  and Epivir   HTN - Coreg  and Lisinopril  are on hold as BP not consistently elevated  H/o ETOH use      Code Status: Full Code Total time on patient care: 35 min DVT prophylaxis:  heparin  injection 5,000 Units Start: 03/13/24 1400     Objective:   Vitals:   03/18/24 0400 03/18/24 2032 03/19/24 0529 03/19/24 1152  BP: 114/72 (!) 152/99 120/82 (!) 114/94  Pulse: 91 100 73 94  Resp: 18 18  14   Temp: 98 F (36.7 C) 97.7 F (36.5 C) (!) 97.4 F (36.3 C) 97.8 F (36.6 C)  TempSrc: Oral Oral Oral   SpO2: 100% 100% 100% 100%  Weight:      Height:       Filed Weights   03/13/24 0244 03/13/24 0500  Weight: 59.9 kg 60.4 kg   Exam: General exam: Appears comfortable  HEENT: oral mucosa moist Respiratory system: Clear to auscultation.  Cardiovascular  system: S1 & S2 heard  Gastrointestinal system: Abdomen soft, non-tender, nondistended. Normal bowel sounds   Extremities: No cyanosis, clubbing or edema Psychiatry:  Mood & affect appropriate.      CBC: Recent Labs  Lab 03/14/24 1827 03/16/24 0813 03/17/24 0304 03/18/24 0452 03/19/24 0446  WBC 4.4 4.6 5.2 6.1 3.9*  NEUTROABS  --  2.0  --   --   --   HGB 9.3* 9.6* 9.8* 10.4* 10.1*  HCT 29.6* 30.0* 30.0* 31.7* 31.4*  MCV 94.9 94.0 92.9 92.4 93.2  PLT 228 247 226 276 276   Basic Metabolic Panel: Recent Labs  Lab 03/15/24 0306 03/16/24 0813 03/17/24 0304 03/18/24 0452 03/19/24 0446  NA 141 134* 132* 139 139  K 3.9 4.4 4.6 4.3 3.9  CL 105 101 100 100 104  CO2 25 22 22 27 25   GLUCOSE 48* 331* 259* 128* 89  BUN 14 17 22* 30* 18  CREATININE 1.02 1.11 1.14 1.25* 0.84  CALCIUM  10.0 9.9 9.9 10.6* 10.3  MG  --  1.7  --   --   --      Scheduled Meds:  Chlorhexidine  Gluconate Cloth  6 each Topical Daily   dolutegravir   50 mg Oral Daily   And   lamiVUDine   300 mg Oral Daily   feeding supplement (GLUCERNA SHAKE)  237  mL Oral TID BM   gabapentin   300 mg Oral TID   heparin   5,000 Units Subcutaneous Q8H   insulin  aspart  0-9 Units Subcutaneous TID WC   insulin  aspart  3 Units Subcutaneous TID WC   insulin  glargine  8 Units Subcutaneous Daily   QUEtiapine   25 mg Oral q morning   QUEtiapine   75 mg Oral QHS   sertraline   50 mg Oral Daily    Imaging and lab data personally reviewed   Author: Ekansh Sherk  03/19/2024 5:27 PM  To contact [facility]>   Check the care team in [facility] and look for the attending/consulting [facility] provider listed  Log into [email or web address] and use [place]'s universal password   Go to> [facility]  and find provider  If you still have difficulty reaching the provider, please page the [facility] (Director on Call) for the Hospitalists listed on amion

## 2024-03-20 DIAGNOSIS — R410 Disorientation, unspecified: Secondary | ICD-10-CM | POA: Diagnosis not present

## 2024-03-20 LAB — GLUCOSE, CAPILLARY
Glucose-Capillary: 309 mg/dL — ABNORMAL HIGH (ref 70–99)
Glucose-Capillary: 313 mg/dL — ABNORMAL HIGH (ref 70–99)
Glucose-Capillary: 336 mg/dL — ABNORMAL HIGH (ref 70–99)
Glucose-Capillary: 353 mg/dL — ABNORMAL HIGH (ref 70–99)
Glucose-Capillary: 68 mg/dL — ABNORMAL LOW (ref 70–99)

## 2024-03-20 LAB — CBC
HCT: 31.7 % — ABNORMAL LOW (ref 39.0–52.0)
Hemoglobin: 10.1 g/dL — ABNORMAL LOW (ref 13.0–17.0)
MCH: 29.8 pg (ref 26.0–34.0)
MCHC: 31.9 g/dL (ref 30.0–36.0)
MCV: 93.5 fL (ref 80.0–100.0)
Platelets: 276 K/uL (ref 150–400)
RBC: 3.39 MIL/uL — ABNORMAL LOW (ref 4.22–5.81)
RDW: 13.6 % (ref 11.5–15.5)
WBC: 4.7 K/uL (ref 4.0–10.5)
nRBC: 0 % (ref 0.0–0.2)

## 2024-03-20 LAB — BASIC METABOLIC PANEL WITH GFR
Anion gap: 13 (ref 5–15)
BUN: 18 mg/dL (ref 6–20)
CO2: 22 mmol/L (ref 22–32)
Calcium: 9.9 mg/dL (ref 8.9–10.3)
Chloride: 100 mmol/L (ref 98–111)
Creatinine, Ser: 1.1 mg/dL (ref 0.61–1.24)
GFR, Estimated: >60 mL/min (ref >60)
Glucose, Bld: 355 mg/dL — ABNORMAL HIGH (ref 70–99)
Potassium: 4.8 mmol/L (ref 3.5–5.1)
Sodium: 135 mmol/L (ref 135–145)

## 2024-03-20 MED ORDER — INSULIN GLARGINE 100 UNIT/ML ~~LOC~~ SOLN
18.0000 [IU] | Freq: Every day | SUBCUTANEOUS | Status: DC
  Administered 2024-03-21: 18 [IU] via SUBCUTANEOUS
  Filled 2024-03-20: qty 0.18

## 2024-03-20 MED ORDER — INSULIN GLARGINE 100 UNIT/ML ~~LOC~~ SOLN
14.0000 [IU] | Freq: Every day | SUBCUTANEOUS | Status: DC
  Administered 2024-03-20: 11:00:00 14 [IU] via SUBCUTANEOUS
  Filled 2024-03-20: qty 0.14

## 2024-03-20 MED ORDER — CARVEDILOL 6.25 MG PO TABS
6.2500 mg | ORAL_TABLET | Freq: Two times a day (BID) | ORAL | Status: DC
  Administered 2024-03-20 – 2024-03-25 (×10): 6.25 mg via ORAL
  Filled 2024-03-20 (×10): qty 1

## 2024-03-20 NOTE — Progress Notes (Addendum)
 Attempted to administer morning insulin  per orders. CBG 353. When attempting to giving insulin  injection, patient adamantly refused stating he does not take insulin  and does not want injection. Attempted to explain rationale behind insulin  but still refusing. Rizwan MD aware.

## 2024-03-20 NOTE — Progress Notes (Addendum)
 Patient refusing cbg check after this Rn asked patient if able to check Blood sugar pt became   verbally combative

## 2024-03-20 NOTE — Plan of Care (Signed)
  Problem: Education: Goal: Ability to describe self-care measures that may prevent or decrease complications (Diabetes Survival Skills Education) will improve Outcome: Progressing Goal: Individualized Educational Video(s) Outcome: Progressing   Problem: Education: Goal: Individualized Educational Video(s) Outcome: Progressing   Problem: Coping: Goal: Ability to adjust to condition or change in health will improve Outcome: Progressing   Problem: Fluid Volume: Goal: Ability to maintain a balanced intake and output will improve Outcome: Progressing   Problem: Health Behavior/Discharge Planning: Goal: Ability to identify and utilize available resources and services will improve Outcome: Progressing Goal: Ability to manage health-related needs will improve Outcome: Progressing

## 2024-03-20 NOTE — Progress Notes (Signed)
 [facility] Progress Note  Patient: Carl Reed     FMW:[id number]  DOA: 03/12/2024   PCP: Beverlie Crazier, PA       Brief hospital course: This is a 58 y/o male with DM, HTN, cognitive delay who presents to the hospital for hyperglycemia. Hospital course complicated by worsening confusion.   Subjective:  No complaints.  Assessment and Plan: Principal Problem:  Hyperglycemia, DM type 1 with poly neuropathy  - resolved - feeding himself now and beginning to eat appropriately- - A1c is 8.8 - sugars are erratic in the hospital with highs of 300s - increased Novolog  insulin  to 5 U but CBGs still elevated- will increase long acting insulin  today  Active Problems:  Confusion - has chronic cognitive issues and per records, apparently had a hypoglycemic episode that was felt to have caused cognitive injury back in 11/2023- he has had waxing and waning mentation since then - on my exam, he is very slow to respond to questions but is able to answer most simple questions appropriately - per wife, he is back to his baseline - will attempt to hold off on resumption of waist restraint - continue Seroquel  and Zoloft  - appreciate assistance from psych team   HIV - cont Tivicay  and Epivir   HTN - resume Coreg  today  H/o ETOH use      Code Status: Full Code Total time on patient care: 35 min DVT prophylaxis:  heparin  injection 5,000 Units Start: 03/13/24 1400     Objective:   Vitals:   03/19/24 0529 03/19/24 1152 03/19/24 1950 03/20/24 0631  BP: 120/82 (!) 114/94 (!) 147/94 (!) 144/85  Pulse: 73 94 (!) 103 89  Resp:  14 16 16   Temp: (!) 97.4 F (36.3 C) 97.8 F (36.6 C) (!) 97.5 F (36.4 C) (!) 97.5 F (36.4 C)  TempSrc: Oral  Oral Oral  SpO2: 100% 100% 100% 100%  Weight:      Height:       Filed Weights   03/13/24 0244 03/13/24 0500  Weight: 59.9 kg 60.4 kg   Exam: General exam: Appears comfortable  HEENT: oral mucosa moist Respiratory system: Clear to  auscultation.  Cardiovascular system: S1 & S2 heard  Gastrointestinal system: Abdomen soft, non-tender, nondistended. Normal bowel sounds   Extremities: No cyanosis, clubbing or edema Psychiatry:  Mood & affect appropriate.      CBC: Recent Labs  Lab 03/16/24 0813 03/17/24 0304 03/18/24 0452 03/19/24 0446 03/20/24 0444  WBC 4.6 5.2 6.1 3.9* 4.7  NEUTROABS 2.0  --   --   --   --   HGB 9.6* 9.8* 10.4* 10.1* 10.1*  HCT 30.0* 30.0* 31.7* 31.4* 31.7*  MCV 94.0 92.9 92.4 93.2 93.5  PLT 247 226 276 276 276   Basic Metabolic Panel: Recent Labs  Lab 03/16/24 0813 03/17/24 0304 03/18/24 0452 03/19/24 0446 03/20/24 0444  NA 134* 132* 139 139 135  K 4.4 4.6 4.3 3.9 4.8  CL 101 100 100 104 100  CO2 22 22 27 25 22   GLUCOSE 331* 259* 128* 89 355*  BUN 17 22* 30* 18 18  CREATININE 1.11 1.14 1.25* 0.84 1.10  CALCIUM  9.9 9.9 10.6* 10.3 9.9  MG 1.7  --   --   --   --      Scheduled Meds:  Chlorhexidine  Gluconate Cloth  6 each Topical Daily   dolutegravir   50 mg Oral Daily   And   lamiVUDine   300 mg Oral Daily   feeding  supplement (GLUCERNA SHAKE)  237 mL Oral TID BM   gabapentin   300 mg Oral TID   heparin   5,000 Units Subcutaneous Q8H   insulin  aspart  0-9 Units Subcutaneous TID WC   insulin  aspart  5 Units Subcutaneous TID WC   insulin  glargine  14 Units Subcutaneous Daily   QUEtiapine   25 mg Oral q morning   QUEtiapine   75 mg Oral QHS   sertraline   50 mg Oral Daily    Imaging and lab data personally reviewed   Author: Daire Okimoto  03/20/2024 2:49 PM  To contact [facility]>   Check the care team in [facility] and look for the attending/consulting [facility] provider listed  Log into [email or web address] and use [place]'s universal password   Go to> [facility]  and find provider  If you still have difficulty reaching the provider, please page the [facility] (Director on Call) for the Hospitalists listed on amion

## 2024-03-20 NOTE — Plan of Care (Signed)
°  Problem: Metabolic: Goal: Ability to maintain appropriate glucose levels will improve Outcome: Not Progressing   Problem: Nutritional: Goal: Maintenance of adequate nutrition will improve Outcome: Progressing   Problem: Metabolic: Goal: Ability to maintain appropriate glucose levels will improve Outcome: Not Progressing

## 2024-03-21 DIAGNOSIS — R410 Disorientation, unspecified: Secondary | ICD-10-CM | POA: Diagnosis not present

## 2024-03-21 LAB — BASIC METABOLIC PANEL WITH GFR
Anion gap: 11 (ref 5–15)
BUN: 25 mg/dL — ABNORMAL HIGH (ref 6–20)
CO2: 24 mmol/L (ref 22–32)
Calcium: 10.3 mg/dL (ref 8.9–10.3)
Chloride: 99 mmol/L (ref 98–111)
Creatinine, Ser: 1.32 mg/dL — ABNORMAL HIGH (ref 0.61–1.24)
GFR, Estimated: >60 mL/min
Glucose, Bld: 197 mg/dL — ABNORMAL HIGH (ref 70–99)
Potassium: 4.7 mmol/L (ref 3.5–5.1)
Sodium: 134 mmol/L — ABNORMAL LOW (ref 135–145)

## 2024-03-21 LAB — GLUCOSE, CAPILLARY
Glucose-Capillary: 173 mg/dL — ABNORMAL HIGH (ref 70–99)
Glucose-Capillary: 203 mg/dL — ABNORMAL HIGH (ref 70–99)
Glucose-Capillary: 270 mg/dL — ABNORMAL HIGH (ref 70–99)
Glucose-Capillary: 33 mg/dL — CL (ref 70–99)
Glucose-Capillary: 330 mg/dL — ABNORMAL HIGH (ref 70–99)
Glucose-Capillary: 35 mg/dL — CL (ref 70–99)
Glucose-Capillary: 532 mg/dL (ref 70–99)
Glucose-Capillary: 566 mg/dL (ref 70–99)
Glucose-Capillary: 85 mg/dL (ref 70–99)

## 2024-03-21 LAB — CBC
HCT: 32.2 % — ABNORMAL LOW (ref 39.0–52.0)
Hemoglobin: 10.4 g/dL — ABNORMAL LOW (ref 13.0–17.0)
MCH: 30 pg (ref 26.0–34.0)
MCHC: 32.3 g/dL (ref 30.0–36.0)
MCV: 92.8 fL (ref 80.0–100.0)
Platelets: 328 K/uL (ref 150–400)
RBC: 3.47 MIL/uL — ABNORMAL LOW (ref 4.22–5.81)
RDW: 13.5 % (ref 11.5–15.5)
WBC: 5.5 K/uL (ref 4.0–10.5)
nRBC: 0 % (ref 0.0–0.2)

## 2024-03-21 LAB — GLUCOSE, RANDOM: Glucose, Bld: 588 mg/dL (ref 70–99)

## 2024-03-21 MED ORDER — INSULIN GLARGINE 100 UNIT/ML ~~LOC~~ SOLN
22.0000 [IU] | Freq: Every day | SUBCUTANEOUS | Status: DC
  Administered 2024-03-22 – 2024-03-25 (×4): 22 [IU] via SUBCUTANEOUS
  Filled 2024-03-21 (×4): qty 0.22

## 2024-03-21 MED ORDER — HALOPERIDOL LACTATE 5 MG/ML IJ SOLN
2.0000 mg | Freq: Four times a day (QID) | INTRAMUSCULAR | Status: DC | PRN
  Administered 2024-03-21 – 2024-03-25 (×2): 2 mg via INTRAMUSCULAR
  Filled 2024-03-21 (×2): qty 1

## 2024-03-21 MED ORDER — HALOPERIDOL 2 MG PO TABS
2.0000 mg | ORAL_TABLET | Freq: Four times a day (QID) | ORAL | Status: DC | PRN
  Administered 2024-03-22: 2 mg via ORAL
  Filled 2024-03-21 (×2): qty 1

## 2024-03-21 MED ORDER — INSULIN ASPART 100 UNIT/ML IJ SOLN
5.0000 [IU] | Freq: Once | INTRAMUSCULAR | Status: AC
  Administered 2024-03-21: 5 [IU] via SUBCUTANEOUS
  Filled 2024-03-21: qty 5

## 2024-03-21 MED ORDER — DEXTROSE 50 % IV SOLN
25.0000 g | INTRAVENOUS | Status: AC
  Administered 2024-03-21: 25 g via INTRAVENOUS

## 2024-03-21 NOTE — Progress Notes (Signed)
 PT Cancellation Note  Patient Details Name: Carl Reed MRN: [id number] DOB: 1966-02-23   Cancelled Treatment:    Reason Eval/Treat Not Completed: Other (comment). Pt declined PT intervention at 1038 and PT inquired if should return later in the day and pt indicated therapist should not. PT to continue to follow acutely.   [place], PT Acute Rehab   [place] VEAR Drone 03/21/2024, 1:32 PM

## 2024-03-21 NOTE — Progress Notes (Signed)
 " [facility] Progress Note  PatientOmair Reed     FMW:[id number]  DOA: 03/12/2024   PCP: Carl Crazier, PA       Brief hospital course: This is a 58 y/o male with DM, HTN, cognitive delay who presents to the hospital for hyperglycemia. Hospital course complicated by worsening confusion.   Subjective:  He has no complaints.   Assessment and Plan: Principal Problem:  Hyperglycemia, DM type 1 with poly neuropathy  - resolved - feeding himself now and beginning to eat appropriately- - A1c is 8.8 - sugars are erratic in the hospital with highs of 300s - increased Novolog  insulin  to 5 U but CBGs still elevated- will increase long acting insulin  again today  Active Problems:  Confusion - has chronic cognitive issues and per records, apparently had a hypoglycemic episode that was felt to have caused cognitive injury back in 11/2023- he has had waxing and waning mentation since then - on my exam, he is very slow to respond to questions but is able to answer most simple questions appropriately - per wife, he is back to his baseline - will attempt to hold off on resumption of waist restraint - continue Seroquel  and Zoloft  - appreciate assistance from psych team   HIV - cont Tivicay  and Epivir   HTN - resumed Coreg     H/o ETOH use      Code Status: Full Code Total time on patient care: 35 min DVT prophylaxis:  heparin  injection 5,000 Units Start: 03/13/24 1400     Objective:   Vitals:   03/20/24 1623 03/20/24 1941 03/20/24 2149 03/21/24 0420  BP: (!) 136/90 122/68 111/77 118/74  Pulse: 93 87 79 81  Resp: 17 19  15   Temp: 98.4 F (36.9 C) 98.2 F (36.8 C)  97.9 F (36.6 C)  TempSrc: Oral     SpO2: 100% 100% 100% 100%  Weight:      Height:       Filed Weights   03/13/24 0244 03/13/24 0500  Weight: 59.9 kg 60.4 kg   Exam: General exam: Appears comfortable  HEENT: oral mucosa moist Respiratory system: Clear to auscultation.  Cardiovascular system:  S1 & S2 heard  Gastrointestinal system: Abdomen soft, non-tender, nondistended. Normal bowel sounds   Extremities: No cyanosis, clubbing or edema Psychiatry:  Mood & affect appropriate.      CBC: Recent Labs  Lab 03/16/24 0813 03/17/24 0304 03/18/24 0452 03/19/24 0446 03/20/24 0444 03/21/24 0452  WBC 4.6 5.2 6.1 3.9* 4.7 5.5  NEUTROABS 2.0  --   --   --   --   --   HGB 9.6* 9.8* 10.4* 10.1* 10.1* 10.4*  HCT 30.0* 30.0* 31.7* 31.4* 31.7* 32.2*  MCV 94.0 92.9 92.4 93.2 93.5 92.8  PLT 247 226 276 276 276 328   Basic Metabolic Panel: Recent Labs  Lab 03/16/24 0813 03/17/24 0304 03/18/24 0452 03/19/24 0446 03/20/24 0444 03/21/24 0452  NA 134* 132* 139 139 135 134*  K 4.4 4.6 4.3 3.9 4.8 4.7  CL 101 100 100 104 100 99  CO2 22 22 27 25 22 24   GLUCOSE 331* 259* 128* 89 355* 197*  BUN 17 22* 30* 18 18 25*  CREATININE 1.11 1.14 1.25* 0.84 1.10 1.32*  CALCIUM  9.9 9.9 10.6* 10.3 9.9 10.3  MG 1.7  --   --   --   --   --      Scheduled Meds:  carvedilol   6.25 mg Oral BID   Chlorhexidine  Gluconate  Cloth  6 each Topical Daily   dolutegravir   50 mg Oral Daily   And   lamiVUDine   300 mg Oral Daily   feeding supplement (GLUCERNA SHAKE)  237 mL Oral TID BM   gabapentin   300 mg Oral TID   heparin   5,000 Units Subcutaneous Q8H   insulin  aspart  0-9 Units Subcutaneous TID WC   insulin  aspart  5 Units Subcutaneous TID WC   insulin  glargine  18 Units Subcutaneous Daily   QUEtiapine   25 mg Oral q morning   QUEtiapine   75 mg Oral QHS   sertraline   50 mg Oral Daily    Imaging and lab data personally reviewed   Author: Shaquan Reed  03/21/2024 2:08 PM  To contact [facility]>   Check the care team in [facility] and look for the attending/consulting [facility] provider listed  Log into [email or web address] and use [place]'s universal password   Go to> [facility]  and find provider  If you still have difficulty reaching the provider, please page the [facility] (Director on Call) for  the Hospitalists listed on amion     "

## 2024-03-21 NOTE — Progress Notes (Signed)
 Received order for bilateral soft wrist restraints.

## 2024-03-21 NOTE — TOC Progression Note (Signed)
 Transition of Care [facility]) - Progression Note    Patient Details  Name: Carl Reed MRN: [id number] Date of Birth: 28-Mar-1966  [facility] CM/SW Contact  Doneta Glenys DASEN, RN Phone Number: 03/21/2024, 2:22 PM  Clinical Narrative:    CM spoke with [facility] Spouse, Emergency [phone] and is agreeable to SNF work-up. Referrals sent via HUB. Waiting on bed offers.   Expected Discharge Plan:  (TBD) Barriers to Discharge: Continued Medical Work up               Expected Discharge Plan and Services In-house Referral: NA Discharge Planning Services: CM Consult   Living arrangements for the past 2 months: Single Family Home                 DME Arranged: N/A DME Agency: NA       HH Arranged:  (Referral sent out, waiting on any acceptances)           Social Drivers of Health (SDOH) Interventions SDOH Screenings   Food Insecurity: Low Risk (03/11/2024)   Received from [facility]  Housing: Low Risk (03/11/2024)   Received from [facility]  Transportation Needs: No Transportation Needs (03/11/2024)   Received from [facility]  Utilities: Low Risk (03/11/2024)   Received from [facility]  Financial Resource Strain: Low Risk  (10/29/2023)   Received from [facility]  Social Connections: Moderately Integrated (10/29/2023)   Received from [facility]  Stress: Patient Unable To Answer (11/28/2023)   Received from [facility]  Tobacco Use: High Risk (03/12/2024)    Readmission Risk Interventions    03/17/2024    3:06 PM 10/10/2022    2:37 PM  Readmission Risk Prevention Plan  Post Dischage Appt  Complete  Medication Screening  Complete  Transportation Screening Complete Complete  PCP or Specialist Appt within 5-7 Days Complete   Home Care Screening Complete   Medication Review (RN CM) Complete

## 2024-03-21 NOTE — Plan of Care (Signed)
  Problem: Education: Goal: Ability to describe self-care measures that may prevent or decrease complications (Diabetes Survival Skills Education) will improve Outcome: Progressing Goal: Individualized Educational Video(s) Outcome: Progressing   Problem: Fluid Volume: Goal: Ability to maintain a balanced intake and output will improve Outcome: Progressing   Problem: Health Behavior/Discharge Planning: Goal: Ability to identify and utilize available resources and services will improve Outcome: Progressing Goal: Ability to manage health-related needs will improve Outcome: Progressing

## 2024-03-21 NOTE — NC FL2 (Signed)
 " [place]  MEDICAID FL2 LEVEL OF CARE FORM     IDENTIFICATION  Patient Name: Carl Reed Birthdate: Dec 03, 1965 Sex: male Admission Date (Current Location): 03/12/2024  [facility] and [place] Number:  Producer, Television/film/video and Address:  [facility],  [address], [place] [address]      Provider Number: [id number]  Attending Physician Name and Address:  Earley Saucer, MD  Relative Name and Phone Number:  HASNAIN, MANHEIM, Emergency Contact  [phone]    Current Level of Care: SNF Recommended Level of Care: Skilled Nursing Facility Prior Approval Number:    Date Approved/Denied:   PASRR Number: [id number]  Discharge Plan: SNF    Current Diagnoses: Patient Active Problem List   Diagnosis Date Noted   Encephalopathy 03/17/2024   AMS (altered mental status) 03/13/2024   Macrocytic anemia 04/26/2023   Tobacco use disorder 04/26/2023   Alcohol abuse 04/26/2023   Abnormal LFTs 10/10/2022   AKI (acute kidney injury) 10/10/2022   Hypomagnesemia 06/07/2022   Avascular necrosis of bone of hip, left (HCC) 06/06/2022   DKA (diabetic ketoacidosis) (HCC) 03/27/2020   Diabetes (HCC) 03/27/2020   HIV (human immunodeficiency virus infection) (HCC) 03/27/2020   GERD (gastroesophageal reflux disease) 03/27/2020   Hx of dizziness 03/27/2020   Closed displaced fracture of proximal phalanx of left little finger with routine healing 05/08/2019   Alcoholism (HCC) 06/18/2015   Diabetic polyneuropathy associated with type 1 diabetes mellitus (HCC) 12/25/2013   Essential hypertension 02/17/2013    Orientation RESPIRATION BLADDER Height & Weight     Self, Place  Normal Incontinent, External catheter Weight: 60.4 kg Height:  5' 11.5 (181.6 cm)  BEHAVIORAL SYMPTOMS/MOOD NEUROLOGICAL BOWEL NUTRITION STATUS      Incontinent Diet  AMBULATORY STATUS COMMUNICATION OF NEEDS Skin   Extensive Assist Verbally Normal                       Personal Care Assistance  Level of Assistance  Bathing, Feeding, Dressing Bathing Assistance: Limited assistance Feeding assistance: Limited assistance Dressing Assistance: Limited assistance     Functional Limitations Info  Sight Sight Info: Impaired        SPECIAL CARE FACTORS FREQUENCY  PT (By licensed PT), OT (By licensed OT)     PT Frequency: 5x weekly OT Frequency: 5x weekly            Contractures Contractures Info: Present    Additional Factors Info  Code Status, Allergies, Psychotropic Code Status Info: Full Allergies Info: NKA Psychotropic Info: Zoloft , seroquel , ativan , haldol          Current Medications (03/21/2024):  This is the current hospital active medication list Current Facility-Administered Medications  Medication Dose Route Frequency Provider Last Rate Last Admin   acetaminophen  (TYLENOL ) tablet 650 mg  650 mg Oral Q6H PRN Patel, Pranav M, MD   650 mg at 03/13/24 1438   Or   acetaminophen  (TYLENOL ) suppository 650 mg  650 mg Rectal Q6H PRN Patel, Pranav M, MD       carvedilol  (COREG ) tablet 6.25 mg  6.25 mg Oral BID Rizwan, Saima, MD   6.25 mg at 03/21/24 9062   Chlorhexidine  Gluconate Cloth 2 % PADS 6 each  6 each Topical Daily Shona Terry SAILOR, DO   6 each at 03/21/24 9062   dextrose  50 % solution 0-50 mL  0-50 mL Intravenous PRN Shona Terry N, DO       dolutegravir  (TIVICAY ) tablet 50 mg  50 mg Oral  Daily Zella, Mir M, MD   50 mg at 03/21/24 9062   And   lamiVUDine  (EPIVIR ) tablet 300 mg  300 mg Oral Daily Ikramullah, Mir M, MD   300 mg at 03/21/24 9062   feeding supplement (GLUCERNA SHAKE) (GLUCERNA SHAKE) liquid 237 mL  237 mL Oral TID BM Patel, Pranav M, MD   237 mL at 03/21/24 1222   gabapentin  (NEURONTIN ) capsule 300 mg  300 mg Oral TID Patel, Pranav M, MD   300 mg at 03/21/24 9062   haloperidol  (HALDOL ) tablet 2 mg  2 mg Oral Q6H PRN Rizwan, Saima, MD       Or   haloperidol  lactate (HALDOL ) injection 2 mg  2 mg Intramuscular Q6H PRN Rizwan, Saima, MD        heparin  injection 5,000 Units  5,000 Units Subcutaneous Q8H Patel, Pranav M, MD   5,000 Units at 03/21/24 1223   insulin  aspart (novoLOG ) injection 0-9 Units  0-9 Units Subcutaneous TID WC Rizwan, Saima, MD   3 Units at 03/21/24 1223   insulin  aspart (novoLOG ) injection 5 Units  5 Units Subcutaneous TID WC Rizwan, Saima, MD   5 Units at 03/21/24 1223   [START ON 03/22/2024] insulin  glargine (LANTUS ) injection 22 Units  22 Units Subcutaneous Daily Rizwan, Saima, MD       LORazepam  (ATIVAN ) tablet 0.5 mg  0.5 mg Oral Q6H PRN Patel, Pranav M, MD   0.5 mg at 03/18/24 0546   ondansetron  (ZOFRAN ) tablet 4 mg  4 mg Oral Q6H PRN Patel, Pranav M, MD   4 mg at 03/13/24 1614   Or   ondansetron  (ZOFRAN ) injection 4 mg  4 mg Intravenous Q6H PRN Patel, Pranav M, MD       polyethylene glycol (MIRALAX  / GLYCOLAX ) packet 17 g  17 g Oral Daily PRN Shona Laurence N, DO       QUEtiapine  (SEROQUEL ) tablet 25 mg  25 mg Oral q morning Patel, Pranav M, MD   25 mg at 03/21/24 0950   QUEtiapine  (SEROQUEL ) tablet 75 mg  75 mg Oral QHS Patel, Pranav M, MD   75 mg at 03/20/24 2144   sertraline  (ZOLOFT ) tablet 50 mg  50 mg Oral Daily Patel, Pranav M, MD   50 mg at 03/21/24 9062     Discharge Medications: Please see discharge summary for a list of discharge medications.  Relevant Imaging Results:  Relevant Lab Results:   Additional Information SS#  [id number]  Doneta Glenys DASEN, RN     "

## 2024-03-21 NOTE — Progress Notes (Incomplete)
" ° ° ° °  Patient Name: Steele Stracener           DOB: 24-Aug-1965  MRN: [id number]      Admission Date: 03/12/2024  Attending Provider: Earley Saucer, MD  Primary Diagnosis: AMS (altered mental status)   Level of care: Med-Surg   OVERNIGHT EVENT   Notified by ***   Plan:    Addendum:    Lavanda Horns, DNP, ACNPC- AG [facility] [place]    "

## 2024-03-21 NOTE — Plan of Care (Signed)
" °  Problem: Education: Goal: Ability to describe self-care measures that may prevent or decrease complications (Diabetes Survival Skills Education) will improve Outcome: Progressing Goal: Individualized Educational Video(s) Outcome: Progressing   Problem: Coping: Goal: Ability to adjust to condition or change in health will improve Outcome: Progressing   Problem: Fluid Volume: Goal: Ability to maintain a balanced intake and output will improve Outcome: Progressing   Problem: Health Behavior/Discharge Planning: Goal: Ability to identify and utilize available resources and services will improve Outcome: Progressing Goal: Ability to manage health-related needs will improve Outcome: Progressing   Problem: Metabolic: Goal: Ability to maintain appropriate glucose levels will improve Outcome: Progressing   Problem: Nutritional: Goal: Maintenance of adequate nutrition will improve Outcome: Progressing Goal: Progress toward achieving an optimal weight will improve Outcome: Progressing   Problem: Education: Goal: Ability to describe self-care measures that may prevent or decrease complications (Diabetes Survival Skills Education) will improve Outcome: Progressing Goal: Individualized Educational Video(s) Outcome: Progressing   Problem: Tissue Perfusion: Goal: Adequacy of tissue perfusion will improve Outcome: Progressing   Problem: Respiratory: Goal: Will regain and/or maintain adequate ventilation Outcome: Progressing   "

## 2024-03-21 NOTE — Progress Notes (Signed)
 Patient awake, getting out of bed and cannot be redirected. Resisting care and safety precautions. Patient starting to shout at staff, threatening, cussing. Attempting to hit on staff with hands and legs. Called security. Initiated 4 pt restraint on patient. Haldol  Im prn given. Provider notified for restraint order.

## 2024-03-21 NOTE — Progress Notes (Signed)
" ° ° ° °  Patient Name: Dalten Ambrosino           DOB: [birth year]  MRN: [id number]      Admission Date: 03/12/2024  Attending Provider: Earley Saucer, MD  Primary Diagnosis: AMS (altered mental status)   Level of care: Med-Surg   OVERNIGHT EVENT   HPI/ Events of Note Pinkney Harts, 58 y.o. male, was admitted on 03/12/2024 for AMS (altered mental status).  Notified by nursing staff that patient is experiencing increased agitation.   CBG 500s for the past 2 hours, verified with serum glucose.  Earlier today, he was hypoglycemic and required D50.   Agitated, restless, and confused.  PMHx of chronic cognitive issues Patient only alert to self.  Does not respond to simple questions appropriately and is cursing staff.   Unfortunately, he is not following commands despite multiple attempts at redirection by staff.  Patient has recently received Haldol , however he is now hitting and kicking staff.  Nonviolent restraints requested by nursing staff for temporary safety use.   Plan: Continue Haldol   Restraint to be discontinued as soon as patient is able to safely have them removed. Treat hyperglycemia with subcu insulin , monitor CBG every 4 hours   Nylan Nakatani, DNP, ACNPC- AG [facility]       "

## 2024-03-22 DIAGNOSIS — R410 Disorientation, unspecified: Secondary | ICD-10-CM | POA: Diagnosis not present

## 2024-03-22 LAB — GLUCOSE, CAPILLARY
Glucose-Capillary: 129 mg/dL — ABNORMAL HIGH (ref 70–99)
Glucose-Capillary: 149 mg/dL — ABNORMAL HIGH (ref 70–99)
Glucose-Capillary: 312 mg/dL — ABNORMAL HIGH (ref 70–99)
Glucose-Capillary: 336 mg/dL — ABNORMAL HIGH (ref 70–99)
Glucose-Capillary: 348 mg/dL — ABNORMAL HIGH (ref 70–99)
Glucose-Capillary: 365 mg/dL — ABNORMAL HIGH (ref 70–99)
Glucose-Capillary: 488 mg/dL — ABNORMAL HIGH (ref 70–99)
Glucose-Capillary: 505 mg/dL (ref 70–99)
Glucose-Capillary: 68 mg/dL — ABNORMAL LOW (ref 70–99)

## 2024-03-22 LAB — CBC
HCT: 31.8 % — ABNORMAL LOW (ref 39.0–52.0)
Hemoglobin: 10.6 g/dL — ABNORMAL LOW (ref 13.0–17.0)
MCH: 30.4 pg (ref 26.0–34.0)
MCHC: 33.3 g/dL (ref 30.0–36.0)
MCV: 91.1 fL (ref 80.0–100.0)
Platelets: 314 K/uL (ref 150–400)
RBC: 3.49 MIL/uL — ABNORMAL LOW (ref 4.22–5.81)
RDW: 13.5 % (ref 11.5–15.5)
WBC: 5.5 K/uL (ref 4.0–10.5)
nRBC: 0 % (ref 0.0–0.2)

## 2024-03-22 MED ORDER — OLANZAPINE 5 MG PO TABS
2.5000 mg | ORAL_TABLET | Freq: Every day | ORAL | Status: DC | PRN
  Administered 2024-03-22 – 2024-03-25 (×2): 2.5 mg via ORAL
  Filled 2024-03-22 (×2): qty 1

## 2024-03-22 NOTE — Plan of Care (Signed)
" °  Problem: Education: Goal: Ability to describe self-care measures that may prevent or decrease complications (Diabetes Survival Skills Education) will improve Outcome: Progressing   Problem: Coping: Goal: Ability to adjust to condition or change in health will improve Outcome: Progressing   Problem: Health Behavior/Discharge Planning: Goal: Ability to identify and utilize available resources and services will improve 03/22/2024 0519 by Cathleen Christobal BROCKS, RN Outcome: Progressing 03/22/2024 0518 by Cathleen Christobal BROCKS, RN Outcome: Progressing   Problem: Metabolic: Goal: Ability to maintain appropriate glucose levels will improve Outcome: Progressing   Problem: Skin Integrity: Goal: Risk for impaired skin integrity will decrease Outcome: Progressing   Problem: Health Behavior/Discharge Planning: Goal: Ability to identify and utilize available resources and services will improve Outcome: Progressing   Problem: Activity: Goal: Risk for activity intolerance will decrease Outcome: Progressing   Problem: Coping: Goal: Level of anxiety will decrease Outcome: Progressing   Problem: Safety: Goal: Ability to remain free from injury will improve 03/22/2024 0519 by Cathleen Christobal BROCKS, RN Outcome: Progressing 03/22/2024 0518 by Cathleen Christobal BROCKS, RN Outcome: Progressing   Problem: Safety: Goal: Non-violent Restraint(s) 03/22/2024 0519 by Cathleen Christobal BROCKS, RN Outcome: Progressing 03/22/2024 0518 by Cathleen Christobal BROCKS, RN Outcome: Progressing 03/22/2024 0518 by Cathleen Christobal BROCKS, RN Reactivated   "

## 2024-03-22 NOTE — Progress Notes (Signed)
 " [facility] Progress Note  PatientMarina Reed     FMW:[id number]  DOA: 03/12/2024   PCP: Beverlie Crazier, PA       Brief hospital course: This is a 58 y/o male with DM, HTN, cognitive delay who presents to the hospital for hyperglycemia. Hospital course complicated by worsening confusion.   Subjective:  No complaints.   Assessment and Plan: Principal Problem:  Hyperglycemia, DM type 1 with poly neuropathy  - resolved - feeding himself now and beginning to eat appropriately- - A1c is 8.8 - sugars are erratic in the hospital with highs of 300s - increased Novolog  insulin  to 5 U but CBGs still elevated-    Active Problems:  Confusion - has chronic cognitive issues and per records, apparently had a hypoglycemic episode that was felt to have caused cognitive injury back in 11/2023- he has had waxing and waning mentation since then - on my exam, he is very slow to respond to questions but is able to answer most simple questions appropriately - per wife, he is back to his baseline - will attempt to hold off on resumption of waist restraint - continue Seroquel  and Zoloft  - can use Zyprexa  PRN - appreciate assistance from psych team  HIV - cont Tivicay  and Epivir   HTN - resumed Coreg     H/o ETOH use      Code Status: Full Code Total time on patient care: 35 min DVT prophylaxis:  heparin  injection 5,000 Units Start: 03/13/24 1400     Objective:   Vitals:   03/21/24 1704 03/21/24 2008 03/21/24 2309 03/22/24 0543  BP: (!) 148/84 (!) 156/80 (!) 150/95 (!) 141/95  Pulse: 67 90 (!) 109 94  Resp:  18 16 16   Temp: (!) 97.5 F (36.4 C) (!) 97.5 F (36.4 C) 97.8 F (36.6 C) 97.6 F (36.4 C)  TempSrc: Oral Oral    SpO2: 100% 100% 100% 99%  Weight:      Height:       Filed Weights   03/13/24 0244 03/13/24 0500  Weight: 59.9 kg 60.4 kg   Exam: General exam: Appears comfortable  HEENT: oral mucosa moist Respiratory system: Clear to auscultation.   Cardiovascular system: S1 & S2 heard  Gastrointestinal system: Abdomen soft, non-tender, nondistended. Normal bowel sounds   Extremities: No cyanosis, clubbing or edema Psychiatry:  Mood & affect appropriate.   CBC: Recent Labs  Lab 03/16/24 0813 03/17/24 0304 03/18/24 0452 03/19/24 0446 03/20/24 0444 03/21/24 0452 03/22/24 0809  WBC 4.6   < > 6.1 3.9* 4.7 5.5 5.5  NEUTROABS 2.0  --   --   --   --   --   --   HGB 9.6*   < > 10.4* 10.1* 10.1* 10.4* 10.6*  HCT 30.0*   < > 31.7* 31.4* 31.7* 32.2* 31.8*  MCV 94.0   < > 92.4 93.2 93.5 92.8 91.1  PLT 247   < > 276 276 276 328 314   < > = values in this interval not displayed.   Basic Metabolic Panel: Recent Labs  Lab 03/16/24 0813 03/17/24 0304 03/18/24 0452 03/19/24 0446 03/20/24 0444 03/21/24 0452 03/21/24 2106  NA 134* 132* 139 139 135 134*  --   K 4.4 4.6 4.3 3.9 4.8 4.7  --   CL 101 100 100 104 100 99  --   CO2 22 22 27 25 22 24   --   GLUCOSE 331* 259* 128* 89 355* 197* 588*  BUN 17 22* 30*  18 18 25*  --   CREATININE 1.11 1.14 1.25* 0.84 1.10 1.32*  --   CALCIUM  9.9 9.9 10.6* 10.3 9.9 10.3  --   MG 1.7  --   --   --   --   --   --      Scheduled Meds:  carvedilol   6.25 mg Oral BID   Chlorhexidine  Gluconate Cloth  6 each Topical Daily   dolutegravir   50 mg Oral Daily   And   lamiVUDine   300 mg Oral Daily   feeding supplement (GLUCERNA SHAKE)  237 mL Oral TID BM   gabapentin   300 mg Oral TID   heparin   5,000 Units Subcutaneous Q8H   insulin  aspart  0-9 Units Subcutaneous TID WC   insulin  aspart  5 Units Subcutaneous TID WC   insulin  glargine  22 Units Subcutaneous Daily   QUEtiapine   25 mg Oral q morning   QUEtiapine   75 mg Oral QHS   sertraline   50 mg Oral Daily    Imaging and lab data personally reviewed   Author: Dari Carpenito  03/22/2024 12:15 PM  To contact [facility]>   Check the care team in [facility] and look for the attending/consulting [facility] provider listed  Log into [email or web address] and use  [place]'s universal password   Go to> [facility]  and find provider  If you still have difficulty reaching the provider, please page the [facility] (Director on Call) for the Hospitalists listed on amion     "

## 2024-03-23 DIAGNOSIS — R410 Disorientation, unspecified: Secondary | ICD-10-CM | POA: Diagnosis not present

## 2024-03-23 LAB — GLUCOSE, CAPILLARY
Glucose-Capillary: 176 mg/dL — ABNORMAL HIGH (ref 70–99)
Glucose-Capillary: 161 mg/dL — ABNORMAL HIGH (ref 70–99)
Glucose-Capillary: 283 mg/dL — ABNORMAL HIGH (ref 70–99)
Glucose-Capillary: 221 mg/dL — ABNORMAL HIGH (ref 70–99)
Glucose-Capillary: 194 mg/dL — ABNORMAL HIGH (ref 70–99)

## 2024-03-23 LAB — CBC
HCT: 31.5 % — ABNORMAL LOW (ref 39.0–52.0)
Hemoglobin: 10.3 g/dL — ABNORMAL LOW (ref 13.0–17.0)
MCH: 30.3 pg (ref 26.0–34.0)
MCHC: 32.7 g/dL (ref 30.0–36.0)
MCV: 92.6 fL (ref 80.0–100.0)
Platelets: 323 K/uL (ref 150–400)
RBC: 3.4 MIL/uL — ABNORMAL LOW (ref 4.22–5.81)
RDW: 13.5 % (ref 11.5–15.5)
WBC: 5.8 K/uL (ref 4.0–10.5)
nRBC: 0 % (ref 0.0–0.2)

## 2024-03-23 NOTE — Progress Notes (Addendum)
 . [facility] Progress Note  PatientEwald Reed     FMW:[id number]  DOA: 03/12/2024   PCP: Beverlie Crazier, PA       Brief hospital course: This is a 58 y/o male with DM, HTN, cognitive delay who presents to the hospital for hyperglycemia. Hospital course complicated by worsening confusion.   Subjective:  No complaints.   Assessment and Plan: Principal Problem:  Hyperglycemia, DM type 1 with poly neuropathy  - resolved - feeding himself now and beginning to eat appropriately- - A1c is 8.8 - sugars are now improving- cont to follow  Active Problems:  Confusion - has chronic cognitive issues and per records, apparently had a hypoglycemic episode that was felt to have caused cognitive injury back in 11/2023- he has had waxing and waning mentation since then - on my exam, he is very slow to respond to questions but is able to answer most simple questions appropriately - per wife, he is back to his baseline - will attempt to hold off on resumption of waist restraint - continue Seroquel  and Zoloft  - can use Zyprexa  PRN (used both Zyprexa  and PRN Haldol  yesterday) - appreciate assistance from psych team  HIV - cont Tivicay  and Epivir   HTN - resumed Coreg     H/o ETOH use  Disposition- Unable to find SNF, hopefully home with [facility] tomorrow as long as sugars are controlled.       Code Status: Full Code Total time on patient care: 35 min DVT prophylaxis:  heparin  injection 5,000 Units Start: 03/13/24 1400     Objective:   Vitals:   03/22/24 0543 03/22/24 1354 03/22/24 2051 03/23/24 1004  BP: (!) 141/95 (!) 141/82 (!) 133/93 (!) 136/96  Pulse: 94 96 92 90  Resp: 16 19  18   Temp: 97.6 F (36.4 C) 98.1 F (36.7 C) 98.4 F (36.9 C) 97.9 F (36.6 C)  TempSrc:  Oral  Oral  SpO2: 99% 100% 100% 100%  Weight:      Height:       Filed Weights   03/13/24 0244 03/13/24 0500  Weight: 59.9 kg 60.4 kg   Exam: General exam: Appears comfortable - sitting up-  feeding himself. HEENT: oral mucosa moist Respiratory system: Clear to auscultation.  Cardiovascular system: S1 & S2 heard  Gastrointestinal system: Abdomen soft, non-tender, nondistended. Normal bowel sounds   Extremities: No cyanosis, clubbing or edema Psychiatry:  Mood & affect appropriate.   CBC: Recent Labs  Lab 03/19/24 0446 03/20/24 0444 03/21/24 0452 03/22/24 0809 03/23/24 0447  WBC 3.9* 4.7 5.5 5.5 5.8  HGB 10.1* 10.1* 10.4* 10.6* 10.3*  HCT 31.4* 31.7* 32.2* 31.8* 31.5*  MCV 93.2 93.5 92.8 91.1 92.6  PLT 276 276 328 314 323   Basic Metabolic Panel: Recent Labs  Lab 03/17/24 0304 03/18/24 0452 03/19/24 0446 03/20/24 0444 03/21/24 0452 03/21/24 2106  NA 132* 139 139 135 134*  --   K 4.6 4.3 3.9 4.8 4.7  --   CL 100 100 104 100 99  --   CO2 22 27 25 22 24   --   GLUCOSE 259* 128* 89 355* 197* 588*  BUN 22* 30* 18 18 25*  --   CREATININE 1.14 1.25* 0.84 1.10 1.32*  --   CALCIUM  9.9 10.6* 10.3 9.9 10.3  --      Scheduled Meds:  carvedilol   6.25 mg Oral BID   Chlorhexidine  Gluconate Cloth  6 each Topical Daily   dolutegravir   50 mg Oral Daily  And   lamiVUDine   300 mg Oral Daily   gabapentin   300 mg Oral TID   heparin   5,000 Units Subcutaneous Q8H   insulin  aspart  5 Units Subcutaneous TID WC   insulin  glargine  22 Units Subcutaneous Daily   QUEtiapine   25 mg Oral q morning   QUEtiapine   75 mg Oral QHS   sertraline   50 mg Oral Daily    Imaging and lab data personally reviewed   Author: Suresh Audi  03/23/2024 10:05 AM  To contact [facility]>   Check the care team in [facility] and look for the attending/consulting [facility] provider listed  Log into [email or web address] and use [place]'s universal password   Go to> [facility]  and find provider  If you still have difficulty reaching the provider, please page the [facility] (Director on Call) for the Hospitalists listed on amion

## 2024-03-24 ENCOUNTER — Other Ambulatory Visit (HOSPITAL_COMMUNITY): Payer: Self-pay

## 2024-03-24 ENCOUNTER — Other Ambulatory Visit: Payer: Self-pay

## 2024-03-24 DIAGNOSIS — Z794 Long term (current) use of insulin: Secondary | ICD-10-CM | POA: Diagnosis not present

## 2024-03-24 DIAGNOSIS — E1165 Type 2 diabetes mellitus with hyperglycemia: Secondary | ICD-10-CM

## 2024-03-24 LAB — GLUCOSE, CAPILLARY
Glucose-Capillary: 129 mg/dL — ABNORMAL HIGH (ref 70–99)
Glucose-Capillary: 164 mg/dL — ABNORMAL HIGH (ref 70–99)
Glucose-Capillary: 269 mg/dL — ABNORMAL HIGH (ref 70–99)
Glucose-Capillary: 282 mg/dL — ABNORMAL HIGH (ref 70–99)
Glucose-Capillary: 353 mg/dL — ABNORMAL HIGH (ref 70–99)
Glucose-Capillary: 405 mg/dL — ABNORMAL HIGH (ref 70–99)
Glucose-Capillary: 66 mg/dL — ABNORMAL LOW (ref 70–99)

## 2024-03-24 MED ORDER — HALOPERIDOL 1 MG PO TABS
2.0000 mg | ORAL_TABLET | Freq: Four times a day (QID) | ORAL | 0 refills | Status: AC | PRN
  Filled 2024-03-24: qty 60, 8d supply, fill #0

## 2024-03-24 MED ORDER — INSULIN LISPRO 100 UNIT/ML IJ SOLN
5.0000 [IU] | Freq: Three times a day (TID) | INTRAMUSCULAR | 11 refills | Status: AC
  Filled 2024-03-24: qty 10, 67d supply, fill #0

## 2024-03-24 MED ORDER — LAMIVUDINE 300 MG PO TABS
300.0000 mg | ORAL_TABLET | Freq: Every day | ORAL | 0 refills | Status: DC
  Filled 2024-03-24: qty 30, 30d supply, fill #0

## 2024-03-24 MED ORDER — HALOPERIDOL LACTATE 2 MG/ML PO CONC
2.0000 mg | Freq: Four times a day (QID) | ORAL | 0 refills | Status: DC | PRN
  Filled 2024-03-24: qty 15, 4d supply, fill #0

## 2024-03-24 MED ORDER — DOLUTEGRAVIR SODIUM 50 MG PO TABS
50.0000 mg | ORAL_TABLET | Freq: Every day | ORAL | 0 refills | Status: DC
  Filled 2024-03-24: qty 30, 30d supply, fill #0

## 2024-03-24 NOTE — Discharge Instructions (Addendum)
 You were cared for by a hospitalist during your hospital stay. Please review all of you discharge paperwork on the day of discharge and be sure you have all of your prescribed medications and please read the below instructions:  Once you are discharged, your primary care physician will handle any further medical issues. Please note that NO REFILLS for any discharge medications will be authorized once you are discharged as it is imperative that you return to your primary care physician (or establish a relationship with a primary care physician if you do not have one) for your aftercare needs. Please obtain a follow up appointment with your primary care physician within 1-2 weeks of discharge. Please take all your medications with you for your next visit with your Primary MD. Please request your Primary MD to go over all Hospital Tests and Procedures, Radiological results at the follow up appointment. In some cases, there will be blood work, cultures and biopsy results pending at the time of your discharge. Please request that your primary care M.D. goes through all the records of your hospital data and follows up on these results. Please get all hospital records sent to your primary MD by signing hospital release before you go home or request your primary care doctor's office to assist with obtaining medical records.   You must read complete instructions/literature along with all the possible adverse reactions/side effects for all the medicines that have been prescribed to you. Take any new medicines after you have completely understood and accpet all the possible adverse reactions/side effects.  Please take medications as prescribed and speak with your doctor if changes are needed.   If you have smoked or chewed tobacco in the last 2 yrs please stop. Stop any regular alcohol  and or any recreational drug use. Wear Seat belts while driving.   If you had Pneumonia at the Hospital: Please get a 2 view  Chest X ray done in 6-8 weeks after hospital discharge or sooner if instructed by your Primary MD.   If you have Congestive Heart Failure: Follow a cardiac low salt diet and 1.5 lit/day fluid restriction. Please call your Cardiologist or Primary MD anytime you have any of the following symptoms:  1) 3 pound weight gain in 24 hours or 5 pounds in 1 week  2) shortness of breath, with or without a dry hacking cough  3) increasing swelling in the feet or stomach  4) if you have to sleep on extra pillows at night in order to breathe   If you have Diabetes: Check blood sugars 4 times/day- once on AM empty stomach and then before each meal. Log in all results and show them to your primary doctor at your next visit. If glucose readings are often under 60 or above 400 call your primary MD to see if medication dosages need to be adjusted   If you have Syncope (passing out) or Seizure/Convulsions/Epilepsy: Please do not drive, operate heavy machinery, participate in activities at heights or participate in high speed sports until you have seen by Primary MD or a Neurologist and advised to do so again. Per [facility] statutes, patients with seizures are not allowed to drive until they have been seizure-free for six months.  Use caution when using heavy equipment or power tools. Avoid working on ladders or at heights. Take showers instead of baths. Ensure the water temperature is not too high on the home water heater. Do not go swimming alone. Do not lock yourself  in a room alone (i.e. bathroom). When caring for infants or small children, sit down when holding, feeding, or changing them to minimize risk of injury to the child in the event you have a seizure. Maintain good sleep hygiene. Avoid alcohol.    If you had Gastrointestinal Bleeding: Please ask your Primary MD to check a complete blood count within one week of discharge or at your next visit. Your endoscopic/colonoscopic biopsies that are  pending at the time of discharge will also need to followed by your Primary MD.    Carl Reed can reach the hospitalist office at phone [phone] or fax [phone]   If you do not have a primary care physician, you can call [phone] for a physician referral.

## 2024-03-24 NOTE — TOC Progression Note (Addendum)
 Transition of Care [facility]) - Progression Note    Patient Details  Name: Carl Reed MRN: [id number] Date of Birth: 03-06-1966  Transition of Care [facility] CM/SW Contact  Doneta Glenys DASEN, RN Phone Number: 03/24/2024, 11:19 AM  Clinical Narrative:    CM called [facility] to consider, will need to evaluate in person. 1:23 PM CM reach out [facility] and will come evaluate patient later today. 1:36 PM CM spoke with Merrily (wife) to discuss discharge options and she is leaning towards [facility]. Will update with HH results. 3:21 PM [facility] has accepted patient and will start insurance auth.CM called wife and she will call CM back in about 15 minutes. 3:45 PM Patients wife and family are agreeable to SNF. Provider updated.   Expected Discharge Plan:  (TBD) Barriers to Discharge: SNF Pending bed offer               Expected Discharge Plan and Services In-house Referral: NA Discharge Planning Services: CM Consult   Living arrangements for the past 2 months: Single Family Home Expected Discharge Date: 03/24/24               DME Arranged: N/A DME Agency: NA       HH Arranged:  (Referral sent out, waiting on any acceptances)           Social Drivers of Health (SDOH) Interventions SDOH Screenings   Food Insecurity: Low Risk (03/11/2024)   Received from [facility]  Housing: Low Risk (03/11/2024)   Received from [facility]  Transportation Needs: No Transportation Needs (03/11/2024)   Received from [facility]  Utilities: Low Risk (03/11/2024)   Received from [facility]  Financial Resource Strain: Low Risk  (10/29/2023)   Received from [facility]  Social Connections: Moderately Integrated (10/29/2023)   Received from [facility]  Stress: Patient Unable To Answer (11/28/2023)   Received from [facility]  Tobacco Use: High Risk (03/12/2024)    Readmission Risk Interventions    03/17/2024    3:06 PM 10/10/2022    2:37 PM  Readmission  Risk Prevention Plan  Post Dischage Appt  Complete  Medication Screening  Complete  Transportation Screening Complete Complete  PCP or Specialist Appt within 5-7 Days Complete   Home Care Screening Complete   Medication Review (RN CM) Complete

## 2024-03-24 NOTE — Discharge Summary (Signed)
 Physician Discharge Summary  Carl Reed FMW:[id number] DOB: 1965-11-12 DOA: 03/12/2024  PCP: Beverlie Crazier, PA  Admit date: 03/12/2024 Discharge date: 03/24/2024 Discharging to: home Recommendations for Outpatient Follow-up:  Recommend neuropsych evaluation Recommend loose control of sugars to prevent hypoglycemic episodes       Discharge Diagnoses:   Principal Problem:   Uncontrolled type 2 diabetes mellitus with hyperglycemia, with long-term current use of insulin  (HCC) Active Problems:   HIV (human immunodeficiency virus infection) (HCC)   Essential hypertension    Brief hospital course: This is a 58 y/o male with DM, HTN, cognitive delay who presents to the hospital for hyperglycemia. Hospital course complicated by worsening confusion.    Subjective:  No complaints.    Assessment and Plan: Principal Problem:  Hyperglycemia, DM type 1 with poly neuropathy  - A1c is 8.8 - he was eating erratically, sometimes less than half his meals and also taking Ensure between meals and it was difficult to control his sugars - sugars are now improving and remaining < 300- if he eats in between meals, they rise to 3-400s as expected - discussed with wife that they will need to wait until he is done with meals to determine how much short acting insulin  to give   Active Problems:  H/ O poor cognition  - has chronic cognitive issues and per records, apparently had a hypoglycemic episode that was felt to have caused cognitive injury back in 11/2023- he has had waxing and waning mentation since then - on my exam, he is very slow to respond to questions but is able to answer most simple questions appropriately - temporarily required a waist restraint beld - per wife, he is back to his baseline - continue Seroquel  and Zoloft  - can use Haldol  PRN for episodic worsening at home - He has an appointment with neurology coming up   HIV - cont Tivicay  and Epivir    HTN - resumed Coreg       H/o ETOH use  Underweight Body mass index is 18.31 kg/m.           Discharge Instructions   Allergies as of 03/24/2024   No Known Allergies      Medication List     STOP taking these medications    cyclobenzaprine 10 MG tablet Commonly known as: FLEXERIL   docusate sodium  100 MG capsule Commonly known as: COLACE   haloperidol  2 MG/ML solution Commonly known as: HALDOL    HYDROcodone -acetaminophen  5-325 MG tablet Commonly known as: NORCO/VICODIN   insulin  glargine-yfgn 100 UNIT/ML Pen Commonly known as: SEMGLEE    lisinopril  5 MG tablet Commonly known as: ZESTRIL    nicotine  14 mg/24hr patch Commonly known as: NICODERM CQ  - dosed in mg/24 hours   NON FORMULARY   NON FORMULARY   NON FORMULARY   pantoprazole  40 MG tablet Commonly known as: PROTONIX    traZODone  50 MG tablet Commonly known as: DESYREL    TYLENOL  500 MG tablet Generic drug: acetaminophen        TAKE these medications    Artificial Tears PF 0.1-0.3 % Soln Generic drug: Dextran 70-Hypromellose (PF) Place 1 drop into both eyes 3 (three) times daily as needed (for dryness).   Baqsimi  One Pack 3 MG/DOSE Powd Generic drug: Glucagon  Use 1 spray in 1 nostril once as directed for severe low blood sugar (hypoglycemia).   carvedilol  6.25 MG tablet Commonly known as: COREG  Take 6.25 mg by mouth 2 (two) times daily.   Dexcom G7 Sensor Misc Apply 1 Device topically See admin  instructions. Apply 1 device every 10 days   Dovato  50-300 MG tablet Generic drug: dolutegravir -lamiVUDine  TAKE 1 TABLET BY MOUTH ONCE DAILY. TAKE 2 HOURS BEFORE MULTIVITAMIN, SUCRALAFATE, AND FEEDING SUPPLEMENT What changed: See the new instructions.   gabapentin  300 MG capsule Commonly known as: NEURONTIN  Take 600 mg by mouth 3 (three) times daily.   haloperidol  1 MG tablet Commonly known as: HALDOL  Take 2 tablets (2 mg total) by mouth every 6 (six) hours as needed for agitation.   insulin  glargine 100  UNIT/ML injection Commonly known as: LANTUS  Inject 25 Units into the skin daily. What changed: Another medication with the same name was removed. Continue taking this medication, and follow the directions you see here.   insulin  lispro 100 UNIT/ML injection Commonly known as: HUMALOG  Inject 0.05 mLs (5 Units total) into the skin 3 (three) times daily with meals. Take 2 U if only eating half of your meal Use up to 50 units daily via pump What changed:  how much to take how to take this when to take this additional instructions   LORazepam  0.5 MG tablet Commonly known as: ATIVAN  Take 0.5 mg by mouth every 6 (six) hours as needed.   magnesium  oxide 400 MG tablet Commonly known as: MAG-OX Take 400 mg by mouth daily.   Multi-Vitamin tablet Take 1 tablet by mouth daily with breakfast.   QUEtiapine  25 MG tablet Commonly known as: SEROQUEL  Take 75 mg by mouth at bedtime.   QUEtiapine  25 MG tablet Commonly known as: SEROQUEL  Take 25 mg by mouth every morning. Given in the morning   sertraline  50 MG tablet Commonly known as: ZOLOFT  Take 50 mg by mouth in the morning.   tadalafil 20 MG tablet Commonly known as: CIALIS Take 20 mg by mouth daily as needed for erectile dysfunction.            The results of significant diagnostics from this hospitalization (including imaging, microbiology, ancillary and laboratory) are listed below for reference.    DG CHEST PORT 1 VIEW Result Date: 03/13/2024 EXAM: XR Chest, 1 View(s). CLINICAL HISTORY: 58 year old male with shortness of breath. COMPARISON: 10/27/2023 FINDINGS: Patient rotated to the left now. LUNGS: Stable lung volumes. Allowing for technique, both lungs appear clear. No consolidation. PLEURAL SPACES: No pleural effusion or pneumothorax. HEART: The heart size is normal. BONES: No acute osseous abnormality. LINES AND TUBES: Enteric tube removed. ABDOMEN: Paucity of bowel gas in the visible abdomen. IMPRESSION: 1. No acute  cardiopulmonary abnormality. Electronically signed by: Helayne Hurst MD 03/13/2024 09:32 AM EST RP Workstation: [id number]   CT HEAD WO CONTRAST ( ) Result Date: 03/13/2024 EXAM: CT Head Without Intravenous Contrast. CLINICAL HISTORY: 58 year old male. Mental status change, unknown cause. TECHNIQUE: Axial computed tomography images of the head/brain without intravenous contrast. Dose reduction technique was used including one or more of the following: automated exposure control, adjustment of mA and kV according to patient size, and/or iterative reconstruction. CONTRAST: Without. COMPARISON: Brain MRI 10/25/2023, Head CT 10/15/2023. FINDINGS: BRAIN: Stable brain volume. No acute intraparenchymal hemorrhage. No mass lesion. No CT evidence for acute territorial infarct. No midline shift or extra-axial collection. Gray white differentiation is stable and within normal limits for age. No suspicious intracranial vascular hyperdensity. VENTRICLES: No hydrocephalus. ORBITS: The orbits are unremarkable. SINUSES AND MASTOIDS: Bilateral paranasal sinuses inflammation and opacification appears largely resolved since July. Tympanic cavities and mastoids are well aerated. SOFT TISSUES: No significant facial or scalp soft tissue swelling evident. No radiopaque foreign body is seen. BONES:  Scaphocephaly, normal variant. No acute skull fracture. IMPRESSION: 1. Stable, normal for age non-contrast CT appearance of the brain. Electronically signed by: Helayne Hurst MD 03/13/2024 09:30 AM EST RP Workstation: [id number]   Labs:   Basic Metabolic Panel: Recent Labs  Lab 03/18/24 0452 03/19/24 0446 03/20/24 0444 03/21/24 0452 03/21/24 2106  NA 139 139 135 134*  --   K 4.3 3.9 4.8 4.7  --   CL 100 104 100 99  --   CO2 27 25 22 24   --   GLUCOSE 128* 89 355* 197* 588*  BUN 30* 18 18 25*  --   CREATININE 1.25* 0.84 1.10 1.32*  --   CALCIUM  10.6* 10.3 9.9 10.3  --      CBC: Recent Labs  Lab 03/19/24 0446  03/20/24 0444 03/21/24 0452 03/22/24 0809 03/23/24 0447  WBC 3.9* 4.7 5.5 5.5 5.8  HGB 10.1* 10.1* 10.4* 10.6* 10.3*  HCT 31.4* 31.7* 32.2* 31.8* 31.5*  MCV 93.2 93.5 92.8 91.1 92.6  PLT 276 276 328 314 323         SIGNED:   True Atlas, MD  [facility] 03/24/2024, 2:22 PM Time taking on discharge: 50 minutes

## 2024-03-24 NOTE — Plan of Care (Signed)
" °  Problem: Coping: Goal: Ability to adjust to condition or change in health will improve Outcome: Progressing   Problem: Metabolic: Goal: Ability to maintain appropriate glucose levels will improve Outcome: Progressing   Problem: Nutritional: Goal: Maintenance of adequate nutrition will improve Outcome: Progressing   Problem: Cardiac: Goal: Ability to maintain an adequate cardiac output will improve Outcome: Progressing   Problem: Fluid Volume: Goal: Ability to achieve a balanced intake and output will improve Outcome: Progressing   Problem: Metabolic: Goal: Ability to maintain appropriate glucose levels will improve Outcome: Progressing   Problem: Respiratory: Goal: Will regain and/or maintain adequate ventilation Outcome: Progressing   Problem: Urinary Elimination: Goal: Ability to achieve and maintain adequate renal perfusion and functioning will improve Outcome: Progressing   Problem: Clinical Measurements: Goal: Cardiovascular complication will be avoided Outcome: Progressing   Problem: Activity: Goal: Risk for activity intolerance will decrease Outcome: Progressing   Problem: Nutrition: Goal: Adequate nutrition will be maintained Outcome: Progressing   Problem: Coping: Goal: Level of anxiety will decrease Outcome: Progressing   Problem: Elimination: Goal: Will not experience complications related to bowel motility Outcome: Progressing Goal: Will not experience complications related to urinary retention Outcome: Progressing   "

## 2024-03-24 NOTE — Progress Notes (Signed)
 Discharge med in a secure bag delivered to patient by this RN.  Too soon for insulin  to be filled per pharmacy- see not ein secure bag.

## 2024-03-25 ENCOUNTER — Other Ambulatory Visit: Payer: Self-pay

## 2024-03-25 ENCOUNTER — Other Ambulatory Visit (HOSPITAL_COMMUNITY): Payer: Self-pay

## 2024-03-25 DIAGNOSIS — E1165 Type 2 diabetes mellitus with hyperglycemia: Secondary | ICD-10-CM | POA: Diagnosis not present

## 2024-03-25 DIAGNOSIS — Z794 Long term (current) use of insulin: Secondary | ICD-10-CM | POA: Diagnosis not present

## 2024-03-25 LAB — GLUCOSE, CAPILLARY
Glucose-Capillary: 138 mg/dL — ABNORMAL HIGH (ref 70–99)
Glucose-Capillary: 147 mg/dL — ABNORMAL HIGH (ref 70–99)
Glucose-Capillary: 175 mg/dL — ABNORMAL HIGH (ref 70–99)
Glucose-Capillary: 189 mg/dL — ABNORMAL HIGH (ref 70–99)
Glucose-Capillary: 233 mg/dL — ABNORMAL HIGH (ref 70–99)
Glucose-Capillary: 70 mg/dL (ref 70–99)

## 2024-03-25 MED ORDER — HALOPERIDOL LACTATE 5 MG/ML IJ SOLN
2.0000 mg | Freq: Four times a day (QID) | INTRAMUSCULAR | 0 refills | Status: AC | PRN
  Filled 2024-03-25: qty 1, 1d supply, fill #0

## 2024-03-25 MED ORDER — HALOPERIDOL LACTATE 5 MG/ML IJ SOLN
2.0000 mg | Freq: Four times a day (QID) | INTRAMUSCULAR | Status: DC | PRN
  Administered 2024-03-25: 2 mg via INTRAMUSCULAR
  Filled 2024-03-25: qty 1

## 2024-03-25 NOTE — Progress Notes (Addendum)
 Bed alarm went off. RN entered the room, the patient states he needs to go to the bathroom right now. Patient not willing to use the urinal. RN brought the [facility] next to the bed since the patient stated it was an urgent need to use the bathroom. Patient not willing to use the [facility]. RN and NT attempted to assist the patient to walk to the bathroom, patient began yelling and swung his arm at the NT and told her to get away. Consulting Civil Engineer and another RN came to the room to assist with the patient. Patient difficult to redirect, patient agitated and being loud with staff. Patient made it to the bathroom and sat on the toilet safely, with staff assist.   Full linen and gown change completed.   Patient was assisted to the bed. Patient kept being loud with this RN. Patient sitting on the edge of the bed, unwilling to get fully in the bed, continues to be agitated. This RN administered prn Haldol  per the prn order (See MAR).   Bed locked in lowest position, call bell in reach.

## 2024-03-25 NOTE — Progress Notes (Signed)
 Patient is agitated, kept getting up, cannot be redirected.PRN Haldol  2mg  IM given.

## 2024-03-25 NOTE — Progress Notes (Incomplete)
 CBG 66   RN gave Carl Reed juice   RN notified Lynwood Kipper, NP  recheck: CBG 70  RN notified Lynwood Kipper, NP RN instructed to administer milk d/t it being a long acting protein.   RN gave patient milk to drink  Repeat CBG 135

## 2024-03-25 NOTE — Progress Notes (Signed)
 Physical Therapy Treatment Patient Details Name: Carl Reed MRN: [id number] DOB: November 16, 1965 Today's Date: 03/25/2024   History of Present Illness Patient is a 58 y/o male admitted 03/12/24 due to significant hyperglycemia and delirium due to encephalopathy.  PMH positive for  DM on insulin  pump, HTN, ETOH dependence, HIV.    PT Comments  The patient received in bed trying to exit with bed rail up, RN in room. Patient assisted to stand and step to day bed. RN  encouraging patient  to take med and drink water. Patient with limited  verbalizations.  Patient required  increased assistance of 2 to stand and step back to bed as patient was not going to ambulate.  Manually assisted legs back onto bed Patient has ambulated in previous  PT sessions when  patient participates. Patient will benefit from continued inpatient follow up therapy, <3 hours/day.  Continue PT efforts as patient will participate. RN reports Haldol  given ~ 345 AM.   If plan is discharge home, recommend the following: A little help with walking and/or transfers;A little help with bathing/dressing/bathroom;Help with stairs or ramp for entrance;Assistance with cooking/housework;Assist for transportation   Can travel by private vehicle        Equipment Recommendations  None recommended by PT    Recommendations for Other Services       Precautions / Restrictions Precautions Precautions: Fall Precaution/Restrictions Comments: becomes easily agitated Restrictions Weight Bearing Restrictions Per Provider Order: No     Mobility  Bed Mobility   Bed Mobility: Supine to Sit, Sit to Supine           General bed mobility comments: patient stood at bed side, impulsive, Then manual support with strong  tactile cues and assist with legs to return to supine.    Transfers Overall transfer level: Needs assistance Equipment used: Rolling walker (2 wheels) Transfers: Sit to/from Stand, Bed to chair/wheelchair/BSC Sit to  Stand: Min assist, +2 safety/equipment   Step pivot transfers: Mod assist, +2 physical assistance, +2 safety/equipment       General transfer comment: required increased assist due to slow, delayed initiation.  Rigid throughout.  patient stood from bed with rail up, indicating he wanted to transfer to day bed, +2  mo support Hand hold to  step to daybed. Patient required much  cues and support to stand from day bed and step back to bed, patient did not participate in ambulation.Pt offered no self assist to push up.    Required Max Assist to control stand to sit due to rigidity and not following.    Ambulation/Gait               General Gait Details: pt did not progress to ambulate  with PT   Stairs             Wheelchair Mobility     Tilt Bed    Modified Rankin (Stroke Patients Only)       Balance Overall balance assessment: Needs assistance   Sitting balance-Leahy Scale: Fair Sitting balance - Comments: sits statue like, minimal  interactionn   Standing balance support: Reliant on assistive device for balance, During functional activity, Bilateral upper extremity supported   Standing balance comment: Hand hold assist  to stand to  step to bed with much cues.                            Communication Communication Communication: Impaired Factors Affecting Communication: Reduced clarity  of speech  Cognition Arousal: Alert Behavior During Therapy: Flat affect   PT - Cognitive impairments: Orientation, Initiation, Attention, Awareness, Safety/Judgement                       PT - Cognition Comments: very slow to respond and move requiring repeat instructions.  Staring , limited eye contact, tardive. Patient restless and climbing out of bed, redirected to sttand and  ambulate x 3' to day bed. Mummbles a few words.  Confused to current location/situation.  Required MAX physical guidence for direction.  VERY soft spoken, difficult to  understand. Following commands: Impaired Following commands impaired: Follows one step commands inconsistently    Cueing Cueing Techniques: Verbal cues, Tactile cues, Visual cues  Exercises      General Comments        Pertinent Vitals/Pain Pain Assessment Faces Pain Scale: No hurt Breathing: normal Negative Vocalization: none Facial Expression: smiling or inexpressive Body Language: tense, distressed pacing, fidgeting    Home Living                          Prior Function            PT Goals (current goals can now be found in the care plan section) Acute Rehab PT Goals Patient Stated Goal: unable to state PT Goal Formulation: Patient unable to participate in goal setting Time For Goal Achievement: 04/08/24 Potential to Achieve Goals: Fair Progress towards PT goals: Not progressing toward goals - comment;Goals updated    Frequency    Min 2X/week      PT Plan      Co-evaluation              AM-PAC PT 6 Clicks Mobility   Outcome Measure  Help needed turning from your back to your side while in a flat bed without using bedrails?: None Help needed moving from lying on your back to sitting on the side of a flat bed without using bedrails?: A Little Help needed moving to and from a bed to a chair (including a wheelchair)?: A Lot Help needed standing up from a chair using your arms (e.g., wheelchair or bedside chair)?: A Lot Help needed to walk in hospital room?: A Lot Help needed climbing 3-5 steps with a railing? : Total 6 Click Score: 14    End of Session Equipment Utilized During Treatment: Gait belt Activity Tolerance: Patient tolerated treatment well Patient left: in bed;with call bell/phone within reach;with bed alarm set Nurse Communication: Mobility status;Other (comment) PT Visit Diagnosis: Other abnormalities of gait and mobility (R26.89);Difficulty in walking, not elsewhere classified (R26.2)     Time: 9154-9097 PT Time  Calculation (min) (ACUTE ONLY): 17 min  Charges:    $Therapeutic Activity: 8-22 mins PT General Charges $$ ACUTE PT VISIT: 1 Visit                     Darice Potters PT Acute Rehabilitation Services Office [phone]    Potters Darice Norris 03/25/2024, 10:07 AM

## 2024-03-25 NOTE — Progress Notes (Addendum)
 Pt physically and verbally aggressive to staff. PRN medications provided by previous shift nurses. Attempted to ambulate patient multiple times per MD request, he refused. Pt also refused PT consult today. Pt was placed in wheelchair 2-3x to help with his restlessness. Pt has remained physically and and verbally aggressive to staff. Called wife in patients room so that patient could talk to wife about plan of care. Pt spoke with wife and told patient to stay calm and hold on until she came to pick him up pt then looked at this RN and used several profanity words. Pt remained in bed. Safety and fall precautions maintained, bed in low position. Pt in no acute distress at this time. MD aware, no new orders. AC notified of patients behavior.

## 2024-03-25 NOTE — Plan of Care (Signed)
   Problem: Tissue Perfusion: Goal: Adequacy of tissue perfusion will improve Outcome: Progressing

## 2024-03-25 NOTE — Progress Notes (Signed)
 Bedside shift report completed. Patient resting with eyes closed, does not appear to be in distress. Respirations are even and unlabored. Call bell within reach, bed locked in lowest position, bed alarm on, fall mat in place.

## 2024-03-25 NOTE — Progress Notes (Signed)
 Patient agitated saying he is going home in a loud voice and continues to try to climb out of the bed. Patient unable to be redirected. This Water Engineer at bedside. Prn po zyprexa  administered per the prn order (See MAR)  RN wheeled patient around the unit in a wheel chair x2. Patient stated he is ready to get into bed. RN assisted patient back to bed. Bed alarm on, bed locked in lowest position, call bell in reach.

## 2024-03-25 NOTE — TOC Transition Note (Signed)
 Transition of Care [facility]) - Discharge Note   Patient Details  Name: Carl Reed MRN: [id number] Date of Birth: 02-Jun-1965  Transition [facility] CM/SW Contact:  Doneta Glenys DASEN, RN Phone Number: 03/25/2024, 4:14 PM   Clinical Narrative:    Home. Wife chose to take patient home since the SNF auth was no back today. No HH agency accepted patient due to insurance out of network.  CM signing off.   Final next level of care: Home/Self Care Barriers to Discharge: Insurance Authorization, Barriers Unresolved (comment) (Spouse chose to take patient home.)   Patient Goals and CMS Choice Patient states their goals for this hospitalization and ongoing recovery are:: Home CMS Medicare.gov Compare Post Acute Care list provided to:: Patient Represenative (must comment) (Spouse) Choice offered to / list presented to : Spouse [place] ownership interest in [facility].provided to:: Spouse    Discharge Placement                       Discharge Plan and Services Additional resources added to the After Visit Summary for   In-house Referral: NA Discharge Planning Services: CM Consult            DME Arranged: Vannie rolling DME Agency: [employer] Date DME Agency Contacted: 03/25/24 Time DME Agency Contacted: [phone] Representative spoke with at DME Agency: [place] HH Arranged: NA HH Agency: NA        Social Drivers of Health (SDOH) Interventions SDOH Screenings   Food Insecurity: Low Risk (03/11/2024)   Received from [facility]  Housing: Low Risk (03/11/2024)   Received from [facility]  Transportation Needs: No Transportation Needs (03/11/2024)   Received from [facility]  Utilities: Low Risk (03/11/2024)   Received from [facility]  Financial Resource Strain: Low Risk  (10/29/2023)   Received from [facility]  Social Connections: Moderately Integrated (10/29/2023)   Received from [facility]  Stress: Patient Unable To Answer  (11/28/2023)   Received from [facility]  Tobacco Use: High Risk (03/12/2024)     Readmission Risk Interventions    03/17/2024    3:06 PM 10/10/2022    2:37 PM  Readmission Risk Prevention Plan  Post Dischage Appt  Complete  Medication Screening  Complete  Transportation Screening Complete Complete  PCP or Specialist Appt within 5-7 Days Complete   Home Care Screening Complete   Medication Review (RN CM) Complete

## 2024-03-25 NOTE — Progress Notes (Signed)
 " [facility] Progress Note  PatientMarlyn Reed     FMW:[id number]  DOA: 03/12/2024   PCP: Beverlie Crazier, PA       Brief hospital course: This is a 58 y/o male with DM, HTN, cognitive delay who presents to the hospital for hyperglycemia. Hospital course complicated by worsening confusion.   Subjective:  No complaints.   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 back in 11/2023- he has had waxing and waning mentation since then - on my exam, he always has a slow response to questions but is able to answer most simple questions appropriately - per wife, he is back to his baseline - will attempt to hold off on resumption of waist restraints - continue Seroquel  and Zoloft  - can use Zyprexa  PRN - appreciate assistance from psych team  HIV - cont Tivicay  and Epivir   HTN - resumed Coreg     H/o ETOH use      Code Status: Full Code Total time on patient care: 35 min DVT prophylaxis:  heparin  injection 5,000 Units Start: 03/13/24 1400     Objective:   Vitals:   03/24/24 1213 03/24/24 1930 03/25/24 0411 03/25/24 0848  BP: 132/88 136/81 (!) 117/92 118/80  Pulse: 85 94 97 99  Resp:  17 16 17   Temp: 97.6 F (36.4 C) 98.8 F (37.1 C) 98.3 F (36.8 C) 98.3 F (36.8 C)  TempSrc: Oral     SpO2: 100% 100% 100% 100%  Weight:      Height:       Filed Weights   03/13/24 0244 03/13/24 0500  Weight: 59.9 kg 60.4 kg   Exam: General exam: Appears comfortable  HEENT: oral mucosa moist Respiratory system: Clear to auscultation.  Cardiovascular system: S1 & S2 heard   Gastrointestinal system: Abdomen soft, non-tender, nondistended. Normal bowel sounds   Extremities: No cyanosis, clubbing or edema Psychiatry:  Mood & affect appropriate.   CBC: Recent Labs  Lab 03/19/24 0446 03/20/24 0444 03/21/24 0452 03/22/24 0809 03/23/24 0447  WBC 3.9* 4.7 5.5 5.5 5.8  HGB 10.1* 10.1* 10.4* 10.6* 10.3*  HCT 31.4* 31.7* 32.2* 31.8* 31.5*  MCV 93.2 93.5 92.8 91.1 92.6  PLT 276 276 328 314 323   Basic Metabolic Panel: Recent Labs  Lab 03/19/24 0446 03/20/24 0444 03/21/24 0452 03/21/24 2106  NA 139 135 134*  --   K 3.9 4.8 4.7  --   CL 104 100 99  --   CO2 25 22 24   --   GLUCOSE 89 355* 197* 588*  BUN 18 18 25*  --   CREATININE 0.84 1.10 1.32*  --   CALCIUM  10.3 9.9 10.3  --      Scheduled Meds:  carvedilol   6.25 mg Oral BID   Chlorhexidine  Gluconate Cloth  6 each Topical Daily   dolutegravir   50 mg Oral Daily   And   lamiVUDine   300 mg Oral Daily   gabapentin   300 mg Oral TID   heparin   5,000 Units Subcutaneous Q8H   insulin  aspart  5 Units Subcutaneous TID WC  insulin  glargine  22 Units Subcutaneous Daily   QUEtiapine   25 mg Oral q morning   QUEtiapine   75 mg Oral QHS   sertraline   50 mg Oral Daily    Imaging and lab data personally reviewed   Author: Haliey Romberg  03/25/2024 2:14 PM  To contact [facility]>   Check the care team in [facility] and look for the attending/consulting [facility] provider listed  Log into [email or web address] and use [place]'s universal password   Go to> [facility]  and find provider  If you still have difficulty reaching the provider, please page the [facility] (Director on Call) for the Hospitalists listed on amion     "

## 2024-03-25 NOTE — Progress Notes (Signed)
 Pt ready for discharge. IV removed. Pt dressed by staff. Transferred from bed to wheelchair. DME walker delivered. Medications at bedside. Wife called and awaiting downstairs in vehicle for patient. Patient wheeled off unit with staff. AVS printed. All belongings patient. Discharge instructions to wife, medication changes reviewed, spouse verbalized understanding.

## 2024-03-25 NOTE — TOC Progression Note (Addendum)
 Transition of Care [facility]) - Progression Note    Patient Details  Name: Carl Reed MRN: [id number] Date of Birth: 1965/12/23  [facility] CM/SW Contact  Doneta Glenys DASEN, RN Phone Number: 03/25/2024, 10:15 AM  Clinical Narrative:    4:11 PM [employer] - DME- walker will be delivered to room. 3:42 PM CM spoke to wife. Updated that no insurance auth for SNF. She is planning to pick patient up between 5-6 pm today for discharge and has requested a walker. No preference of DME choice.[facility] updated.  10:15 AM CM updated [facility] that a PT note has been entered.  CM called Merrily wife to inform her that patient refused to participate in PT today. Serina plan to just take home this evening if insurance is not approved today.  Expected Discharge Plan:  (TBD) Barriers to Discharge: SNF Pending bed offer               Expected Discharge Plan and Services In-house Referral: NA Discharge Planning Services: CM Consult   Living arrangements for the past 2 months: Single Family Home Expected Discharge Date: 03/24/24               DME Arranged: N/A DME Agency: NA       HH Arranged:  (Referral sent out, waiting on any acceptances)           Social Drivers of Health (SDOH) Interventions SDOH Screenings   Food Insecurity: Low Risk (03/11/2024)   Received from [facility]  Housing: Low Risk (03/11/2024)   Received from [facility]  Transportation Needs: No Transportation Needs (03/11/2024)   Received from [facility]  Utilities: Low Risk (03/11/2024)   Received from [facility]  Financial Resource Strain: Low Risk  (10/29/2023)   Received from [facility]  Social Connections: Moderately Integrated (10/29/2023)   Received from [facility]  Stress: Patient Unable To Answer (11/28/2023)   Received from [facility]  Tobacco Use: High Risk (03/12/2024)    Readmission Risk Interventions    03/17/2024    3:06 PM 10/10/2022     2:37 PM  Readmission Risk Prevention Plan  Post Dischage Appt  Complete  Medication Screening  Complete  Transportation Screening Complete Complete  PCP or Specialist Appt within 5-7 Days Complete   Home Care Screening Complete   Medication Review (RN CM) Complete
